# Patient Record
Sex: Female | Born: 1960
Health system: Southern US, Community
[De-identification: ages and names within clinical notes are randomized; demographics above are authoritative.]

## PROBLEM LIST (undated history)

## (undated) DIAGNOSIS — F329 Major depressive disorder, single episode, unspecified: Secondary | ICD-10-CM

## (undated) DIAGNOSIS — I10 Essential (primary) hypertension: Secondary | ICD-10-CM

## (undated) DIAGNOSIS — I499 Cardiac arrhythmia, unspecified: Secondary | ICD-10-CM

## (undated) DIAGNOSIS — F419 Anxiety disorder, unspecified: Secondary | ICD-10-CM

## (undated) DIAGNOSIS — N189 Chronic kidney disease, unspecified: Secondary | ICD-10-CM

## (undated) DIAGNOSIS — F32A Depression, unspecified: Secondary | ICD-10-CM

## (undated) DIAGNOSIS — E785 Hyperlipidemia, unspecified: Secondary | ICD-10-CM

## (undated) DIAGNOSIS — K219 Gastro-esophageal reflux disease without esophagitis: Secondary | ICD-10-CM

## (undated) DIAGNOSIS — J45902 Unspecified asthma with status asthmaticus: Secondary | ICD-10-CM

## (undated) DIAGNOSIS — J4489 Other specified chronic obstructive pulmonary disease: Secondary | ICD-10-CM

## (undated) DIAGNOSIS — R87629 Unspecified abnormal cytological findings in specimens from vagina: Secondary | ICD-10-CM

## (undated) DIAGNOSIS — L719 Rosacea, unspecified: Secondary | ICD-10-CM

## (undated) DIAGNOSIS — I493 Ventricular premature depolarization: Secondary | ICD-10-CM

## (undated) DIAGNOSIS — G43909 Migraine, unspecified, not intractable, without status migrainosus: Secondary | ICD-10-CM

## (undated) DIAGNOSIS — R232 Flushing: Secondary | ICD-10-CM

## (undated) DIAGNOSIS — G4730 Sleep apnea, unspecified: Secondary | ICD-10-CM

## (undated) DIAGNOSIS — T7840XA Allergy, unspecified, initial encounter: Secondary | ICD-10-CM

## (undated) DIAGNOSIS — D649 Anemia, unspecified: Secondary | ICD-10-CM

## (undated) DIAGNOSIS — J449 Chronic obstructive pulmonary disease, unspecified: Secondary | ICD-10-CM

## (undated) HISTORY — DX: Major depressive disorder, single episode, unspecified: F32.9

## (undated) HISTORY — DX: Migraine, unspecified, not intractable, without status migrainosus: G43.909

## (undated) HISTORY — DX: Unspecified abnormal cytological findings in specimens from vagina: R87.629

## (undated) HISTORY — PX: ABDOMINAL HYSTERECTOMY: SHX81

## (undated) HISTORY — DX: Unspecified asthma with status asthmaticus: J45.902

## (undated) HISTORY — DX: Depression, unspecified: F32.A

## (undated) HISTORY — DX: Sleep apnea, unspecified: G47.30

## (undated) HISTORY — PX: COLONOSCOPY: SHX174

## (undated) HISTORY — PX: POLYPECTOMY: SHX149

## (undated) HISTORY — DX: Anemia, unspecified: D64.9

## (undated) HISTORY — DX: Chronic kidney disease, unspecified: N18.9

## (undated) HISTORY — DX: Flushing: R23.2

## (undated) HISTORY — DX: Anxiety disorder, unspecified: F41.9

## (undated) HISTORY — DX: Essential (primary) hypertension: I10

## (undated) HISTORY — DX: Ventricular premature depolarization: I49.3

## (undated) HISTORY — DX: Other specified chronic obstructive pulmonary disease: J44.89

## (undated) HISTORY — DX: Rosacea, unspecified: L71.9

## (undated) HISTORY — DX: Allergy, unspecified, initial encounter: T78.40XA

## (undated) HISTORY — DX: Gastro-esophageal reflux disease without esophagitis: K21.9

## (undated) HISTORY — DX: Chronic obstructive pulmonary disease, unspecified: J44.9

## (undated) HISTORY — DX: Hyperlipidemia, unspecified: E78.5

---

## 1999-06-02 ENCOUNTER — Other Ambulatory Visit: Admission: RE | Admit: 1999-06-02 | Discharge: 1999-06-02 | Payer: Self-pay | Admitting: Obstetrics and Gynecology

## 1999-10-17 ENCOUNTER — Ambulatory Visit (HOSPITAL_COMMUNITY): Admission: RE | Admit: 1999-10-17 | Discharge: 1999-10-17 | Payer: Self-pay | Admitting: Allergy and Immunology

## 1999-10-17 ENCOUNTER — Encounter: Payer: Self-pay | Admitting: Allergy and Immunology

## 2000-07-25 ENCOUNTER — Encounter: Payer: Self-pay | Admitting: Internal Medicine

## 2000-07-25 ENCOUNTER — Encounter: Admission: RE | Admit: 2000-07-25 | Discharge: 2000-07-25 | Payer: Self-pay | Admitting: Internal Medicine

## 2001-06-04 ENCOUNTER — Other Ambulatory Visit: Admission: RE | Admit: 2001-06-04 | Discharge: 2001-06-04 | Payer: Self-pay | Admitting: Obstetrics and Gynecology

## 2001-06-18 ENCOUNTER — Other Ambulatory Visit: Admission: RE | Admit: 2001-06-18 | Discharge: 2001-06-18 | Payer: Self-pay | Admitting: Obstetrics and Gynecology

## 2001-08-12 ENCOUNTER — Ambulatory Visit (HOSPITAL_COMMUNITY): Admission: RE | Admit: 2001-08-12 | Discharge: 2001-08-12 | Payer: Self-pay | Admitting: Internal Medicine

## 2001-09-09 ENCOUNTER — Encounter: Admission: RE | Admit: 2001-09-09 | Discharge: 2001-12-08 | Payer: Self-pay | Admitting: Internal Medicine

## 2001-09-17 ENCOUNTER — Other Ambulatory Visit: Admission: RE | Admit: 2001-09-17 | Discharge: 2001-09-17 | Payer: Self-pay | Admitting: Obstetrics and Gynecology

## 2002-03-19 ENCOUNTER — Ambulatory Visit (HOSPITAL_COMMUNITY): Admission: RE | Admit: 2002-03-19 | Discharge: 2002-03-19 | Payer: Self-pay | Admitting: Internal Medicine

## 2002-03-19 ENCOUNTER — Encounter: Payer: Self-pay | Admitting: Cardiology

## 2002-08-06 ENCOUNTER — Other Ambulatory Visit: Admission: RE | Admit: 2002-08-06 | Discharge: 2002-08-06 | Payer: Self-pay | Admitting: Obstetrics and Gynecology

## 2002-09-26 ENCOUNTER — Encounter: Payer: Self-pay | Admitting: Internal Medicine

## 2002-09-26 ENCOUNTER — Encounter: Admission: RE | Admit: 2002-09-26 | Discharge: 2002-09-26 | Payer: Self-pay | Admitting: Internal Medicine

## 2002-09-29 ENCOUNTER — Encounter: Payer: Self-pay | Admitting: Internal Medicine

## 2002-09-29 ENCOUNTER — Encounter: Admission: RE | Admit: 2002-09-29 | Discharge: 2002-09-29 | Payer: Self-pay | Admitting: Internal Medicine

## 2003-09-18 ENCOUNTER — Other Ambulatory Visit: Admission: RE | Admit: 2003-09-18 | Discharge: 2003-09-18 | Payer: Self-pay | Admitting: Obstetrics and Gynecology

## 2003-11-25 ENCOUNTER — Encounter: Admission: RE | Admit: 2003-11-25 | Discharge: 2003-11-25 | Payer: Self-pay | Admitting: Internal Medicine

## 2004-01-01 ENCOUNTER — Encounter: Admission: RE | Admit: 2004-01-01 | Discharge: 2004-01-01 | Payer: Self-pay | Admitting: Internal Medicine

## 2004-10-18 ENCOUNTER — Other Ambulatory Visit: Admission: RE | Admit: 2004-10-18 | Discharge: 2004-10-18 | Payer: Self-pay | Admitting: Obstetrics and Gynecology

## 2004-10-31 ENCOUNTER — Observation Stay (HOSPITAL_COMMUNITY): Admission: RE | Admit: 2004-10-31 | Discharge: 2004-11-01 | Payer: Self-pay | Admitting: Obstetrics and Gynecology

## 2004-10-31 ENCOUNTER — Encounter (INDEPENDENT_AMBULATORY_CARE_PROVIDER_SITE_OTHER): Payer: Self-pay | Admitting: Specialist

## 2004-12-15 ENCOUNTER — Ambulatory Visit: Payer: Self-pay | Admitting: Internal Medicine

## 2005-01-04 ENCOUNTER — Ambulatory Visit: Payer: Self-pay | Admitting: Internal Medicine

## 2005-02-09 ENCOUNTER — Encounter: Admission: RE | Admit: 2005-02-09 | Discharge: 2005-02-09 | Payer: Self-pay | Admitting: Internal Medicine

## 2006-06-04 ENCOUNTER — Encounter: Admission: RE | Admit: 2006-06-04 | Discharge: 2006-06-04 | Payer: Self-pay | Admitting: Internal Medicine

## 2007-08-26 ENCOUNTER — Encounter: Admission: RE | Admit: 2007-08-26 | Discharge: 2007-08-26 | Payer: Self-pay | Admitting: Internal Medicine

## 2007-12-20 ENCOUNTER — Ambulatory Visit: Payer: Self-pay | Admitting: Internal Medicine

## 2008-01-31 ENCOUNTER — Ambulatory Visit: Payer: Self-pay | Admitting: Internal Medicine

## 2008-07-24 ENCOUNTER — Ambulatory Visit: Payer: Self-pay | Admitting: Internal Medicine

## 2008-09-21 ENCOUNTER — Ambulatory Visit: Payer: Self-pay | Admitting: Internal Medicine

## 2008-12-30 ENCOUNTER — Encounter: Admission: RE | Admit: 2008-12-30 | Discharge: 2008-12-30 | Payer: Self-pay | Admitting: Internal Medicine

## 2009-01-25 ENCOUNTER — Ambulatory Visit: Payer: Self-pay | Admitting: Internal Medicine

## 2009-03-03 ENCOUNTER — Emergency Department (HOSPITAL_COMMUNITY): Admission: EM | Admit: 2009-03-03 | Discharge: 2009-03-03 | Payer: Self-pay | Admitting: Emergency Medicine

## 2009-06-21 ENCOUNTER — Ambulatory Visit: Payer: Self-pay | Admitting: Internal Medicine

## 2009-12-06 ENCOUNTER — Ambulatory Visit: Payer: Self-pay | Admitting: Internal Medicine

## 2010-01-03 ENCOUNTER — Ambulatory Visit: Payer: Self-pay | Admitting: Internal Medicine

## 2010-03-07 ENCOUNTER — Encounter: Admission: RE | Admit: 2010-03-07 | Payer: Self-pay | Source: Home / Self Care | Admitting: Internal Medicine

## 2010-03-16 ENCOUNTER — Encounter: Admission: RE | Admit: 2010-03-16 | Payer: Self-pay | Source: Home / Self Care | Admitting: Internal Medicine

## 2010-03-22 ENCOUNTER — Other Ambulatory Visit: Payer: Self-pay | Admitting: Internal Medicine

## 2010-03-22 DIAGNOSIS — Z1231 Encounter for screening mammogram for malignant neoplasm of breast: Secondary | ICD-10-CM

## 2010-03-22 DIAGNOSIS — Z1239 Encounter for other screening for malignant neoplasm of breast: Secondary | ICD-10-CM

## 2010-03-23 ENCOUNTER — Encounter: Payer: Self-pay | Admitting: Internal Medicine

## 2010-03-30 ENCOUNTER — Ambulatory Visit
Admission: RE | Admit: 2010-03-30 | Discharge: 2010-03-30 | Disposition: A | Discharge status: Home / Self Care | Payer: Commercial Managed Care - PPO | Source: Ambulatory Visit | Attending: Internal Medicine | Admitting: Internal Medicine

## 2010-03-30 DIAGNOSIS — Z1231 Encounter for screening mammogram for malignant neoplasm of breast: Secondary | ICD-10-CM

## 2010-03-30 LAB — HM MAMMOGRAPHY

## 2010-04-04 ENCOUNTER — Other Ambulatory Visit: Payer: Self-pay | Admitting: Internal Medicine

## 2010-04-04 DIAGNOSIS — R928 Other abnormal and inconclusive findings on diagnostic imaging of breast: Secondary | ICD-10-CM

## 2010-04-11 ENCOUNTER — Other Ambulatory Visit: Payer: Commercial Managed Care - PPO

## 2010-05-08 LAB — POCT I-STAT, CHEM 8
Calcium, Ion: 1.24 mmol/L (ref 1.12–1.32)
Chloride: 103 mEq/L (ref 96–112)
Creatinine, Ser: 1.3 mg/dL — ABNORMAL HIGH (ref 0.4–1.2)
HCT: 38 % (ref 36.0–46.0)
Hemoglobin: 12.9 g/dL (ref 12.0–15.0)
Potassium: 4.2 mEq/L (ref 3.5–5.1)
Sodium: 139 mEq/L (ref 135–145)
TCO2: 31 mmol/L (ref 0–100)

## 2010-05-08 LAB — PROTIME-INR: Prothrombin Time: 13.1 seconds (ref 11.6–15.2)

## 2010-05-08 LAB — DIFFERENTIAL
Basophils Relative: 1 % (ref 0–1)
Eosinophils Absolute: 0.2 10*3/uL (ref 0.0–0.7)
Monocytes Absolute: 0.4 10*3/uL (ref 0.1–1.0)
Monocytes Relative: 6 % (ref 3–12)

## 2010-05-08 LAB — CBC
MCHC: 34.4 g/dL (ref 30.0–36.0)
MCV: 89 fL (ref 78.0–100.0)
RBC: 4.17 MIL/uL (ref 3.87–5.11)
WBC: 6.7 10*3/uL (ref 4.0–10.5)

## 2010-05-09 ENCOUNTER — Other Ambulatory Visit: Payer: Commercial Managed Care - PPO

## 2010-05-17 ENCOUNTER — Ambulatory Visit
Admission: RE | Admit: 2010-05-17 | Discharge: 2010-05-17 | Disposition: A | Discharge status: Home / Self Care | Payer: Commercial Managed Care - PPO | Source: Ambulatory Visit | Attending: Internal Medicine | Admitting: Internal Medicine

## 2010-05-17 DIAGNOSIS — R928 Other abnormal and inconclusive findings on diagnostic imaging of breast: Secondary | ICD-10-CM

## 2010-06-06 ENCOUNTER — Other Ambulatory Visit: Payer: Commercial Managed Care - PPO | Admitting: Internal Medicine

## 2010-06-07 ENCOUNTER — Ambulatory Visit: Payer: Commercial Managed Care - PPO | Admitting: Internal Medicine

## 2010-07-08 NOTE — Op Note (Signed)
NAME:  Samantha Hahn, Samantha Hahn                 ACCOUNT NO.:  [id number]   MEDICAL RECORD NO.:  [id number]          PATIENT TYPE:  INP   LOCATION:  9399                          FACILITY:  [facility]   PHYSICIAN:  Cynthia P. Romine, M.D.DATE OF BIRTH:  10/24/1960   DATE OF PROCEDURE:  10/31/2004  DATE OF DISCHARGE:                                 OPERATIVE REPORT   PREOPERATIVE DIAGNOSES:  1.  Menorrhagia.  2.  Probable adenomyosis.   POSTOPERATIVE DIAGNOSES:  1.  Menorrhagia.  2.  Probable adenomyosis.   OPERATION/PROCEDURE:  Total vaginal hysterectomy.   SURGEON:  Cynthia P. Romine, M.D.   ASSISTANT:  Joan Flores, M.D.   ANESTHESIA:  General endotracheal anesthesia.   ESTIMATED BLOOD LOSS:  150 mL.   COMPLICATIONS:  None.   DESCRIPTION OF PROCEDURE:  The patient is taken to the operating room and  was placed in the dorsal lithotomy position and prepped and draped in the  usual fashion after the induction of general endotracheal anesthesia.  The  operator put in a posterior weight and anterior Sims retractor and a Jacobs  tenaculum on the cervix to assess.  I thought a vaginal approach was  possible.  However, we were prepared for an abdominal approach if it was  felt unlikely to be successful to proceed vaginally.  The cervix did descend  more with the patient under anesthesia when traction was applied to the  cervix than it had in the office and, therefore, it was felt reasonable to  attempt the vaginal approach.  The patient was draped in the room and was  set up for a vaginal hysterectomy.  The cervix was grasped at the anterior  lip with the [facility] tenaculum.  The mucosa over the cervix was injected with  1% Xylocaine with epinephrine.  Incision was made in the mucosa over the  cervix from 9 to 3 o'clock.  The bladder was dissected anteriorly.  Attention was then turned posteriorly and a posterior colpotomy incision was  made and the Bonanno retractor was placed.  Uterosacral  ligaments were  clamped, cut and tied with 0 Vicryl.  The patient still had excellent  supportive of her uterus and visualization was slightly difficult.  Cardinal  ligaments were clamped, cut and tied with 0 chromic.  The cardinal ligaments  were quite long and required two or three bites on each side to get up to  uterine arteries which were clamped, cut and tied on each side.  The fundus  was then looked out through the cul-de-sac and the pedicle containing the  utero-ovarian ________ around was clamped, cut and triply tied on each side.  There was a small amount of bleeding just superior to the uterosacral  ligament pedicle on the patient's right that was clipped with the Heaney and  then tied with free tie.  This did achieve hemostasis.  The adnexal pedicles  were respected and felt to be hemostatic.  They were cut free.  The cuff was  then run with a running locking stitch of 0 Vicryl from 2 o'clock to 10  o'clock.  The wound was irrigated, inspected and felt to be hemostatic.  The  vaginal cuff was closed with interrupted figure-of-eights with 0 Vicryl.  Uterosacral ligaments were tied together in the midline.  The incision was  irrigated again.  Instruments were removed from the vagina and the procedure  was terminated.  The patient tolerated it well and went in satisfactory  condition to post anesthesia recovery.  She was put on sliding scale insulin  postoperatively and will resume her normal oral diabetic medications in the  morning.  This will be done when she goes home.  She will be discharged in  the morning and will take the medicines at home when she gets home.           ______________________________  Lubertha South. Romine, M.D.     CPR/MEDQ  D:  10/31/2004  T:  11/01/2004  Job:  [id number]

## 2010-07-08 NOTE — Discharge Summary (Signed)
NAME:  Samantha Hahn, Samantha Hahn                 ACCOUNT NO.:  [id number]   MEDICAL RECORD NO.:  [id number]          PATIENT TYPE:  OBV   LOCATION:  9315                          FACILITY:  [facility]   PHYSICIAN:  Cynthia P. Romine, M.D.DATE OF BIRTH:  [birth year]   DATE OF ADMISSION:  10/31/2004  DATE OF DISCHARGE:  11/01/2004                                 DISCHARGE SUMMARY   DISCHARGE DIAGNOSES:  Menorrhagia.   PROCEDURES:  Total vaginal hysterectomy.   HISTORY:  This is a 50 year old married white female gravida 1, para 1 with  menorrhagia and hemoglobin as low as 10.3.  On pelvic ultrasound her uterus  showed signs of adenomyosis and the patient elected definitive therapy with  hysterectomy.  On October 31, 2004 she underwent a total vaginal  hysterectomy without difficulty.  Estimated blood loss was 150 mL.  There  were no complications.  Postoperatively she did very well.  She was felt to  be in satisfactory condition for discharge on November 01, 2004.  She was  given a prescription for Percocet 5 mg #15 to take one p.o. q.4h. p.r.n.  pain and was instructed to follow up in the office in four weeks.   PATHOLOGY:  Showed adenomyosis and intramural leiomyomas.   LABORATORIES:  Showed admission H&H was 11 and 33, on discharge 9.9 and  29.5.           ______________________________  Lubertha South. Romine, M.D.     CPR/MEDQ  D:  12/21/2004  T:  12/21/2004  Job:  [id number]

## 2010-07-13 ENCOUNTER — Other Ambulatory Visit: Payer: Self-pay | Admitting: Internal Medicine

## 2010-07-14 ENCOUNTER — Other Ambulatory Visit: Payer: Self-pay | Admitting: *Deleted

## 2010-07-14 DIAGNOSIS — N959 Unspecified menopausal and perimenopausal disorder: Secondary | ICD-10-CM

## 2010-07-14 MED ORDER — ESTROGENS CONJUGATED 0.3 MG PO TABS: 0.3000 mg | ORAL_TABLET | Freq: Every day | ORAL | Status: DC

## 2010-08-19 ENCOUNTER — Encounter: Payer: Self-pay | Admitting: Internal Medicine

## 2010-08-23 ENCOUNTER — Other Ambulatory Visit: Payer: Commercial Managed Care - PPO | Admitting: Internal Medicine

## 2010-08-23 DIAGNOSIS — E119 Type 2 diabetes mellitus without complications: Secondary | ICD-10-CM

## 2010-08-23 LAB — HEMOGLOBIN A1C: Mean Plasma Glucose: 197 mg/dL — ABNORMAL HIGH (ref <117)

## 2010-08-25 ENCOUNTER — Ambulatory Visit (INDEPENDENT_AMBULATORY_CARE_PROVIDER_SITE_OTHER): Payer: Commercial Managed Care - PPO | Admitting: Internal Medicine

## 2010-08-25 ENCOUNTER — Encounter: Payer: Self-pay | Admitting: Internal Medicine

## 2010-08-25 DIAGNOSIS — G43909 Migraine, unspecified, not intractable, without status migrainosus: Secondary | ICD-10-CM

## 2010-08-25 DIAGNOSIS — E119 Type 2 diabetes mellitus without complications: Secondary | ICD-10-CM

## 2010-08-25 DIAGNOSIS — I10 Essential (primary) hypertension: Secondary | ICD-10-CM

## 2010-08-25 DIAGNOSIS — F32A Depression, unspecified: Secondary | ICD-10-CM

## 2010-08-25 DIAGNOSIS — F329 Major depressive disorder, single episode, unspecified: Secondary | ICD-10-CM

## 2010-09-18 ENCOUNTER — Encounter: Payer: Self-pay | Admitting: Internal Medicine

## 2010-09-18 DIAGNOSIS — I10 Essential (primary) hypertension: Secondary | ICD-10-CM | POA: Insufficient documentation

## 2010-09-18 DIAGNOSIS — F329 Major depressive disorder, single episode, unspecified: Secondary | ICD-10-CM | POA: Insufficient documentation

## 2010-09-18 DIAGNOSIS — G43909 Migraine, unspecified, not intractable, without status migrainosus: Secondary | ICD-10-CM | POA: Insufficient documentation

## 2010-09-18 DIAGNOSIS — F32A Depression, unspecified: Secondary | ICD-10-CM | POA: Insufficient documentation

## 2010-09-18 DIAGNOSIS — E119 Type 2 diabetes mellitus without complications: Secondary | ICD-10-CM | POA: Insufficient documentation

## 2010-09-18 NOTE — Progress Notes (Signed)
  Subjective:    Patient ID: Samantha Hahn, female    DOB: [birth year], 51 y.o.   MRN: [id number]  HPI patient with history of diabetes treated with metformin and Actos as well as Januvia in today for followup. Hemoglobin A1c drawn. 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    Review of Systems     Objective:   Physical Exam chest clear; cardiac exam regular rate and rhythm normal S1 and S2; extremities without edema.        Assessment & Plan:   Diabetes mellitus  Hypertension  Depression  Migraine headaches  Patient will continue to work with [facility] staff at [facility]. Needs to have physical exam in the next 6 months. Reminded about diabetic eye exam.

## 2010-10-10 ENCOUNTER — Other Ambulatory Visit: Payer: Self-pay | Admitting: Internal Medicine

## 2010-10-12 ENCOUNTER — Other Ambulatory Visit: Payer: Self-pay | Admitting: Internal Medicine

## 2010-10-27 ENCOUNTER — Other Ambulatory Visit: Payer: Commercial Managed Care - PPO | Admitting: Internal Medicine

## 2010-10-28 ENCOUNTER — Other Ambulatory Visit: Payer: Self-pay | Admitting: Internal Medicine

## 2010-10-31 ENCOUNTER — Other Ambulatory Visit: Payer: Commercial Managed Care - PPO | Admitting: Internal Medicine

## 2010-10-31 DIAGNOSIS — E785 Hyperlipidemia, unspecified: Secondary | ICD-10-CM

## 2010-10-31 LAB — HEPATIC FUNCTION PANEL
ALT: 15 U/L (ref 0–35)
AST: 16 U/L (ref 0–37)
Albumin: 3.7 g/dL (ref 3.5–5.2)
Total Protein: 6.5 g/dL (ref 6.0–8.3)

## 2010-10-31 LAB — LIPID PANEL
HDL: 46 mg/dL (ref >39)
Total CHOL/HDL Ratio: 2.8 Ratio
Triglycerides: 79 mg/dL (ref <150)
VLDL: 16 mg/dL (ref 0–40)

## 2010-12-22 ENCOUNTER — Other Ambulatory Visit: Payer: Self-pay | Admitting: Internal Medicine

## 2011-01-10 ENCOUNTER — Other Ambulatory Visit: Payer: Self-pay | Admitting: Internal Medicine

## 2011-02-27 ENCOUNTER — Ambulatory Visit: Payer: PRIVATE HEALTH INSURANCE | Attending: Family Medicine | Admitting: Physical Therapy

## 2011-02-27 DIAGNOSIS — M25519 Pain in unspecified shoulder: Secondary | ICD-10-CM | POA: Insufficient documentation

## 2011-02-27 DIAGNOSIS — IMO0001 Reserved for inherently not codable concepts without codable children: Secondary | ICD-10-CM | POA: Insufficient documentation

## 2011-02-27 DIAGNOSIS — M25619 Stiffness of unspecified shoulder, not elsewhere classified: Secondary | ICD-10-CM | POA: Insufficient documentation

## 2011-03-01 ENCOUNTER — Encounter: Payer: Commercial Managed Care - PPO | Admitting: Physical Therapy

## 2011-03-02 ENCOUNTER — Other Ambulatory Visit: Payer: Self-pay | Admitting: Internal Medicine

## 2011-03-02 ENCOUNTER — Ambulatory Visit: Payer: PRIVATE HEALTH INSURANCE | Admitting: Rehabilitation

## 2011-03-07 ENCOUNTER — Other Ambulatory Visit: Payer: Commercial Managed Care - PPO | Admitting: Internal Medicine

## 2011-03-07 ENCOUNTER — Ambulatory Visit: Payer: PRIVATE HEALTH INSURANCE | Admitting: Physical Therapy

## 2011-03-07 DIAGNOSIS — Z0000 Encounter for general adult medical examination without abnormal findings: Secondary | ICD-10-CM

## 2011-03-07 DIAGNOSIS — I10 Essential (primary) hypertension: Secondary | ICD-10-CM

## 2011-03-08 ENCOUNTER — Ambulatory Visit: Payer: PRIVATE HEALTH INSURANCE | Admitting: Physical Therapy

## 2011-03-08 LAB — COMPREHENSIVE METABOLIC PANEL
ALT: 11 U/L (ref 0–35)
AST: 13 U/L (ref 0–37)
Calcium: 9.5 mg/dL (ref 8.4–10.5)
Chloride: 103 mEq/L (ref 96–112)
Creat: 1.19 mg/dL — ABNORMAL HIGH (ref 0.50–1.10)
Sodium: 139 mEq/L (ref 135–145)
Total Protein: 6.6 g/dL (ref 6.0–8.3)

## 2011-03-08 LAB — CBC WITH DIFFERENTIAL/PLATELET
Basophils Absolute: 0 10*3/uL (ref 0.0–0.1)
Eosinophils Relative: 7 % — ABNORMAL HIGH (ref 0–5)
Lymphocytes Relative: 19 % (ref 12–46)
Lymphs Abs: 1.3 10*3/uL (ref 0.7–4.0)
MCV: 94.3 fL (ref 78.0–100.0)
Neutro Abs: 4.9 10*3/uL (ref 1.7–7.7)
Neutrophils Relative %: 70 % (ref 43–77)
Platelets: 226 10*3/uL (ref 150–400)
RBC: 3.83 MIL/uL — ABNORMAL LOW (ref 3.87–5.11)
RDW: 14 % (ref 11.5–15.5)
WBC: 7 10*3/uL (ref 4.0–10.5)

## 2011-03-08 LAB — VITAMIN D 25 HYDROXY (VIT D DEFICIENCY, FRACTURES): Vit D, 25-Hydroxy: 46 ng/mL (ref 30–89)

## 2011-03-08 LAB — LIPID PANEL
Cholesterol: 136 mg/dL (ref 0–200)
Triglycerides: 170 mg/dL — ABNORMAL HIGH (ref <150)

## 2011-03-08 LAB — HEMOGLOBIN A1C
Hgb A1c MFr Bld: 6.2 % — ABNORMAL HIGH (ref <5.7)
Mean Plasma Glucose: 131 mg/dL — ABNORMAL HIGH (ref <117)

## 2011-03-09 ENCOUNTER — Encounter: Payer: Self-pay | Admitting: Internal Medicine

## 2011-03-09 ENCOUNTER — Other Ambulatory Visit (HOSPITAL_COMMUNITY)
Admission: RE | Admit: 2011-03-09 | Discharge: 2011-03-09 | Disposition: A | Discharge status: Home / Self Care | Payer: 59 | Source: Ambulatory Visit | Attending: Internal Medicine | Admitting: Internal Medicine

## 2011-03-09 ENCOUNTER — Ambulatory Visit (INDEPENDENT_AMBULATORY_CARE_PROVIDER_SITE_OTHER): Payer: 59 | Admitting: Internal Medicine

## 2011-03-09 VITALS — BP 122/72 | HR 76 | Temp 98.2°F | Ht 67.75 in | Wt 233.0 lb

## 2011-03-09 DIAGNOSIS — Z01419 Encounter for gynecological examination (general) (routine) without abnormal findings: Secondary | ICD-10-CM | POA: Insufficient documentation

## 2011-03-09 DIAGNOSIS — Z1272 Encounter for screening for malignant neoplasm of vagina: Secondary | ICD-10-CM

## 2011-03-09 DIAGNOSIS — I10 Essential (primary) hypertension: Secondary | ICD-10-CM

## 2011-03-09 DIAGNOSIS — K219 Gastro-esophageal reflux disease without esophagitis: Secondary | ICD-10-CM

## 2011-03-09 DIAGNOSIS — Z8659 Personal history of other mental and behavioral disorders: Secondary | ICD-10-CM

## 2011-03-09 DIAGNOSIS — Z1211 Encounter for screening for malignant neoplasm of colon: Secondary | ICD-10-CM

## 2011-03-09 DIAGNOSIS — E1169 Type 2 diabetes mellitus with other specified complication: Secondary | ICD-10-CM

## 2011-03-09 DIAGNOSIS — R0602 Shortness of breath: Secondary | ICD-10-CM

## 2011-03-09 DIAGNOSIS — Z0000 Encounter for general adult medical examination without abnormal findings: Secondary | ICD-10-CM

## 2011-03-09 LAB — POCT URINALYSIS DIPSTICK
Bilirubin, UA: NEGATIVE
Blood, UA: NEGATIVE
Glucose, UA: NEGATIVE
Ketones, UA: NEGATIVE
Leukocytes, UA: NEGATIVE
Nitrite, UA: NEGATIVE
Protein, UA: NEGATIVE
Spec Grav, UA: 1.01
Urobilinogen, UA: NEGATIVE
pH, UA: 5

## 2011-03-10 ENCOUNTER — Ambulatory Visit: Payer: Self-pay

## 2011-03-10 ENCOUNTER — Other Ambulatory Visit: Payer: Self-pay | Admitting: Occupational Medicine

## 2011-03-10 DIAGNOSIS — R52 Pain, unspecified: Secondary | ICD-10-CM

## 2011-03-13 ENCOUNTER — Ambulatory Visit: Payer: PRIVATE HEALTH INSURANCE | Admitting: Physical Therapy

## 2011-03-14 ENCOUNTER — Institutional Professional Consult (permissible substitution): Payer: Commercial Managed Care - PPO | Admitting: Cardiology

## 2011-03-14 ENCOUNTER — Encounter: Payer: Self-pay | Admitting: Cardiology

## 2011-03-14 ENCOUNTER — Telehealth: Payer: Self-pay

## 2011-03-14 ENCOUNTER — Ambulatory Visit (INDEPENDENT_AMBULATORY_CARE_PROVIDER_SITE_OTHER): Payer: Commercial Managed Care - PPO | Admitting: Cardiology

## 2011-03-14 DIAGNOSIS — I10 Essential (primary) hypertension: Secondary | ICD-10-CM

## 2011-03-14 DIAGNOSIS — R079 Chest pain, unspecified: Secondary | ICD-10-CM

## 2011-03-14 DIAGNOSIS — R0600 Dyspnea, unspecified: Secondary | ICD-10-CM | POA: Insufficient documentation

## 2011-03-14 DIAGNOSIS — M549 Dorsalgia, unspecified: Secondary | ICD-10-CM | POA: Insufficient documentation

## 2011-03-14 DIAGNOSIS — R0602 Shortness of breath: Secondary | ICD-10-CM

## 2011-03-14 LAB — BRAIN NATRIURETIC PEPTIDE: Pro B Natriuretic peptide (BNP): 165 pg/mL — ABNORMAL HIGH (ref 0.0–100.0)

## 2011-03-14 NOTE — Assessment & Plan Note (Signed)
The blood pressure is at target. No change in medications is indicated. We will continue with therapeutic lifestyle changes (TLC).  

## 2011-03-14 NOTE — Progress Notes (Signed)
HPI The patient presents for evaluation of shortness of breath. This has been progressive over a few weeks. She's been getting more short of breath with activities such as walking 50 yards on level ground. She's here he short of breath climbing up a flight of stairs. She's had a couple of episodes of feeling short of breath during activity such as taking a shower but she's just standing there. She's not describing PND or orthopnea. She has had some mild lower externally swelling and a 7 pound weight gain despite no change in salt or diet or fluid intakes. She's also been having some pain between her shoulders with activity such as walking through the [facility] parking lot. She's not been having any of these symptoms at rest. She does not describe radiation to her jaw or to her arms. She's not describing associated nausea vomiting or diaphoresis. She denies any palpitations, presyncope or syncope.  Allergies  Allergen Reactions  . Biaxin Hives  . Vicodin (Hydrocodone-Acetaminophen) Nausea And Vomiting    Current Outpatient Prescriptions  Medication Sig Dispense Refill  . ACTOS 45 MG tablet TAKE 1 TABLET BY MOUTH DAILY  90 tablet  PRN  . albuterol (PROVENTIL) (5 MG/ML) 0.5% nebulizer solution Take 2.5 mg by nebulization every 6 (six) hours as needed.        Marland Kitchen atorvastatin (LIPITOR) 20 MG tablet TAKE 1 TABLET BY MOUTH DAILY  90 tablet  0  . budesonide (RHINOCORT AQUA) 32 MCG/ACT nasal spray Place 1 spray into the nose daily.        Marland Kitchen glipiZIDE (GLUCOTROL) 10 MG tablet Take 10 mg by mouth 2 (two) times daily before a meal.        . loratadine (CLARITIN) 10 MG tablet Take 10 mg by mouth daily.        . metFORMIN (GLUCOPHAGE) 1000 MG tablet Take 1,000 mg by mouth 2 (two) times daily with a meal.        . metoprolol (TOPROL-XL) 50 MG 24 hr tablet TAKE 1 TABLET BY MOUTH EVERY DAY  90 tablet  PRN  . multivitamin (THERAGRAN) per tablet Take 1 tablet by mouth daily.        Marland Kitchen PREMARIN 0.3 MG tablet TAKE 1  TABLET BY MOUTH ONCE DAILY FOR 21 DAYS THEN DO NOT TAKE FOR 7 DAYS  30 tablet  1  . sitaGLIPtin (JANUVIA) 100 MG tablet Take 100 mg by mouth daily.        . SUMAtriptan (IMITREX) 100 MG tablet Take 100 mg by mouth every 2 (two) hours as needed.        . SYMBICORT 160-4.5 MCG/ACT inhaler INHALE 2 PUFFS BY MOUTH TWICE DAILY  3 Inhaler  PRN  . VENTOLIN HFA 108 (90 BASE) MCG/ACT inhaler INHALE 2 SPRAYS BY MOUTH FOUR TIMES DAILY  54 each  PRN  . METOPROLOL SUCCINATE PO Take 50 mg by mouth 1 dose over 24 hours.          Past Medical History  Diagnosis Date  . Hypertension   . Allergy   . Acne rosacea   . GERD (gastroesophageal reflux disease)   . Diabetes mellitus   . Migraines   . Hot flashes   . Anemia   . Abnormal vaginal Pap smear   . Anxiety   . Depression     Past Surgical History  Procedure Date  . Abdominal hysterectomy     Family History  Problem Relation Age of Onset  . Heart disease Mother   .  Asthma Mother   . Asthma Sister     History   Social History  . Marital Status: Married    Spouse Name: N/A    Number of Children: N/A  . Years of Education: N/A   Occupational History  . Not on file.   Social History Main Topics  . Smoking status: Never Smoker   . Smokeless tobacco: Not on file  . Alcohol Use: Not on file  . Drug Use: Not on file  . Sexually Active: Not on file   Other Topics Concern  . Not on file   Social History Narrative  . No narrative on file    ROS:  As stated in the HPI and negative for all other systems.  PHYSICAL EXAM BP 121/90  Pulse 62  Ht 5\' 8"  (1.727 m)  Wt 235 lb (106.595 kg)  BMI 35.73 kg/m2 GENERAL:  Well appearing HEENT:  Pupils equal round and reactive, fundi not visualized, oral mucosa unremarkable NECK:  No jugular venous distention, waveform within normal limits, carotid upstroke brisk and symmetric, no bruits, no thyromegaly LYMPHATICS:  No cervical, inguinal adenopathy LUNGS:  Clear to auscultation  bilaterally BACK:  No CVA tenderness CHEST:  Unremarkable HEART:  PMI not displaced or sustained,S1 and S2 within normal limits, no S3, no S4, no clicks, no rubs, no murmurs ABD:  Flat, positive bowel sounds normal in frequency in pitch, no bruits, no rebound, no guarding, no midline pulsatile mass, no hepatomegaly, no splenomegaly EXT:  2 plus pulses throughout, no edema, no cyanosis no clubbing SKIN:  No rashes no nodules NEURO:  Cranial nerves II through XII grossly intact, motor grossly intact throughout PSYCH:  Cognitively intact, oriented to person place and time  EKG:  03/09/11  NSR, axis within normal limits, intervals within normal limits, no acute ST-T wave changes  ASSESSMENT AND PLAN

## 2011-03-14 NOTE — Patient Instructions (Addendum)
Please have blood work today. (BNP)  Your physician has requested that you have an echocardiogram. Echocardiography is a painless test that uses sound waves to create images of your heart. It provides your doctor with information about the size and shape of your heart and how well your heart's chambers and valves are working. This procedure takes approximately one hour. There are no restrictions for this procedure.  Your physician has requested that you have cardiac CT. Cardiac computed tomography (CT) is a painless test that uses an x-ray machine to take clear, detailed pictures of your heart. For further information please visit [email or web address]. Please follow instruction sheet as given.

## 2011-03-14 NOTE — Assessment & Plan Note (Addendum)
This pain is concerning for an anginal equivalent. She has significant cardiovascular risk factors including a strong family history. Given this screening with CT coronary angiography is indicated.  I do not think that ETT has the necessary sensitivity in this situation.  If she has a negative ETT and continued symptoms she would need further testing.

## 2011-03-14 NOTE — Telephone Encounter (Signed)
Patient scheduled for an appointment with Dr. Hilarie Fredrickson at [place] on 04/11/2011 for her colonoscopy, and with Dr. Percival Spanish, cardiologist on 04/14/2011 at 11:30. Informed of these appointments.

## 2011-03-14 NOTE — Assessment & Plan Note (Signed)
I will assess this with a BNP level and echocardiogram.

## 2011-03-15 ENCOUNTER — Ambulatory Visit: Payer: PRIVATE HEALTH INSURANCE | Admitting: Physical Therapy

## 2011-03-16 ENCOUNTER — Encounter: Payer: Self-pay | Admitting: *Deleted

## 2011-03-21 ENCOUNTER — Encounter: Payer: Commercial Managed Care - PPO | Admitting: Physical Therapy

## 2011-03-21 ENCOUNTER — Ambulatory Visit (HOSPITAL_COMMUNITY): Payer: 59 | Attending: Cardiology

## 2011-03-21 DIAGNOSIS — K219 Gastro-esophageal reflux disease without esophagitis: Secondary | ICD-10-CM | POA: Insufficient documentation

## 2011-03-21 DIAGNOSIS — R609 Edema, unspecified: Secondary | ICD-10-CM | POA: Insufficient documentation

## 2011-03-21 DIAGNOSIS — E119 Type 2 diabetes mellitus without complications: Secondary | ICD-10-CM | POA: Insufficient documentation

## 2011-03-21 DIAGNOSIS — R0989 Other specified symptoms and signs involving the circulatory and respiratory systems: Secondary | ICD-10-CM | POA: Insufficient documentation

## 2011-03-21 DIAGNOSIS — R0602 Shortness of breath: Secondary | ICD-10-CM

## 2011-03-21 DIAGNOSIS — R0609 Other forms of dyspnea: Secondary | ICD-10-CM | POA: Insufficient documentation

## 2011-03-21 DIAGNOSIS — I10 Essential (primary) hypertension: Secondary | ICD-10-CM

## 2011-03-23 ENCOUNTER — Ambulatory Visit: Payer: PRIVATE HEALTH INSURANCE | Admitting: Physical Therapy

## 2011-03-24 ENCOUNTER — Ambulatory Visit (HOSPITAL_COMMUNITY)
Admission: RE | Admit: 2011-03-24 | Discharge: 2011-03-24 | Disposition: A | Discharge status: Home / Self Care | Payer: 59 | Source: Ambulatory Visit | Attending: Cardiology | Admitting: Cardiology

## 2011-03-24 ENCOUNTER — Encounter (HOSPITAL_COMMUNITY): Payer: Self-pay | Admitting: *Deleted

## 2011-03-24 DIAGNOSIS — R079 Chest pain, unspecified: Secondary | ICD-10-CM | POA: Insufficient documentation

## 2011-03-24 DIAGNOSIS — R0602 Shortness of breath: Secondary | ICD-10-CM | POA: Insufficient documentation

## 2011-03-24 DIAGNOSIS — I10 Essential (primary) hypertension: Secondary | ICD-10-CM | POA: Insufficient documentation

## 2011-03-24 MED ORDER — NITROGLYCERIN 0.4 MG SL SUBL
SUBLINGUAL_TABLET | SUBLINGUAL | Status: AC
  Filled 2011-03-24: qty 25

## 2011-03-24 MED ORDER — METOPROLOL TARTRATE 1 MG/ML IV SOLN
INTRAVENOUS | Status: AC
  Filled 2011-03-24: qty 15

## 2011-03-24 MED ORDER — NITROGLYCERIN 0.4 MG SL SUBL: 0.4000 mg | SUBLINGUAL_TABLET | SUBLINGUAL | Status: DC | PRN

## 2011-03-24 MED ORDER — METOPROLOL TARTRATE 1 MG/ML IV SOLN
5.0000 mg | INTRAVENOUS | Status: DC | PRN
  Administered 2011-03-24 (×2): 5 mg via INTRAVENOUS

## 2011-03-28 ENCOUNTER — Ambulatory Visit: Payer: PRIVATE HEALTH INSURANCE | Attending: Family Medicine | Admitting: Physical Therapy

## 2011-03-28 DIAGNOSIS — IMO0001 Reserved for inherently not codable concepts without codable children: Secondary | ICD-10-CM | POA: Insufficient documentation

## 2011-03-28 DIAGNOSIS — M25519 Pain in unspecified shoulder: Secondary | ICD-10-CM | POA: Insufficient documentation

## 2011-03-28 DIAGNOSIS — M25619 Stiffness of unspecified shoulder, not elsewhere classified: Secondary | ICD-10-CM | POA: Insufficient documentation

## 2011-03-29 ENCOUNTER — Telehealth: Payer: Self-pay | Admitting: Cardiology

## 2011-03-29 DIAGNOSIS — Z8249 Family history of ischemic heart disease and other diseases of the circulatory system: Secondary | ICD-10-CM

## 2011-03-29 DIAGNOSIS — R0602 Shortness of breath: Secondary | ICD-10-CM

## 2011-03-29 NOTE — Telephone Encounter (Signed)
Order placed for stress testing - pt will not be able to walk this study due to her SOB.  Will have the scheduler call her for an appointment.

## 2011-03-29 NOTE — Telephone Encounter (Signed)
Reviewed instructions with pt who states understanding.  She is aware that our office will call for to schedule the appointment

## 2011-03-29 NOTE — Telephone Encounter (Signed)
Pt was to have a procedure but due to PVC's wasn't able to have it done, dr hochrein said he would schedule a different test and his nurse would call first of the week, calling back because she hasn't heard from [place]

## 2011-03-30 ENCOUNTER — Ambulatory Visit: Payer: PRIVATE HEALTH INSURANCE | Admitting: Physical Therapy

## 2011-04-06 ENCOUNTER — Other Ambulatory Visit: Payer: Self-pay | Admitting: Internal Medicine

## 2011-04-06 ENCOUNTER — Encounter: Payer: Commercial Managed Care - PPO | Admitting: Internal Medicine

## 2011-04-06 ENCOUNTER — Ambulatory Visit: Payer: PRIVATE HEALTH INSURANCE | Admitting: Physical Therapy

## 2011-04-11 ENCOUNTER — Encounter: Payer: Self-pay | Admitting: Rehabilitation

## 2011-04-11 ENCOUNTER — Other Ambulatory Visit: Payer: Commercial Managed Care - PPO | Admitting: Internal Medicine

## 2011-04-13 ENCOUNTER — Ambulatory Visit (HOSPITAL_COMMUNITY): Payer: 59 | Attending: Cardiology | Admitting: Radiology

## 2011-04-13 DIAGNOSIS — I10 Essential (primary) hypertension: Secondary | ICD-10-CM | POA: Insufficient documentation

## 2011-04-13 DIAGNOSIS — E119 Type 2 diabetes mellitus without complications: Secondary | ICD-10-CM | POA: Insufficient documentation

## 2011-04-13 DIAGNOSIS — R079 Chest pain, unspecified: Secondary | ICD-10-CM

## 2011-04-13 DIAGNOSIS — R0609 Other forms of dyspnea: Secondary | ICD-10-CM | POA: Insufficient documentation

## 2011-04-13 DIAGNOSIS — R0989 Other specified symptoms and signs involving the circulatory and respiratory systems: Secondary | ICD-10-CM | POA: Insufficient documentation

## 2011-04-13 DIAGNOSIS — Z8249 Family history of ischemic heart disease and other diseases of the circulatory system: Secondary | ICD-10-CM | POA: Insufficient documentation

## 2011-04-13 DIAGNOSIS — R0602 Shortness of breath: Secondary | ICD-10-CM | POA: Insufficient documentation

## 2011-04-13 MED ORDER — REGADENOSON 0.4 MG/5ML IV SOLN
0.4000 mg | Freq: Once | INTRAVENOUS | Status: AC
  Administered 2011-04-13: 0.4 mg via INTRAVENOUS

## 2011-04-13 MED ORDER — TECHNETIUM TC 99M TETROFOSMIN IV KIT
33.0000 | PACK | Freq: Once | INTRAVENOUS | Status: AC | PRN
  Administered 2011-04-13: 33 via INTRAVENOUS

## 2011-04-13 MED ORDER — TECHNETIUM TC 99M TETROFOSMIN IV KIT
10.2000 | PACK | Freq: Once | INTRAVENOUS | Status: AC | PRN
  Administered 2011-04-13: 10 via INTRAVENOUS

## 2011-04-13 NOTE — Progress Notes (Signed)
[facility] [place] [address] [phone]  Cardiology Nuclear Med Study  Samantha Hahn is a 51 y.o. female [id number] June 17, 1960   Nuclear Med Background Indication for Stress Test:  Evaluation for Ischemia History: 1/13 Echo-EF-55-60% mild LVH and GXT Cardiac Risk Factors: Family History - CAD, Hypertension and NIDDM  Symptoms:  DOE and SOB   Nuclear Pre-Procedure Caffeine/Decaff Intake:  None NPO After: 8:00pm   Lungs:  Clear IV 0.9% NS with Angio Cath:  22g  IV Site: R Hand  IV Started by:  Crissie Figures, RN  Chest Size (in):  40 Cup Size: C  Height: 5\' 8"  (1.727 m)  Weight:  233 lb (105.688 kg)  BMI:  Body mass index is 35.43 kg/(m^2). Tech Comments:  Patient held Metoprolol and Diabetic meds this AM    Nuclear Med Study 1 or 2 day study: 1 day  Stress Test Type:  Lexiscan  Reading MD: Dola Argyle, MD  Order Authorizing Provider:  Minus Breeding, MD  Resting Radionuclide: Technetium 51m Tetrofosmin  Resting Radionuclide Dose: 10.2 mCi   Stress Radionuclide:  Technetium 49m Tetrofosmin  Stress Radionuclide Dose: 33.0 mCi           Stress Protocol Rest HR: 64 Stress HR: 96  Rest BP: 132/80 Stress BP: 136/76  Exercise Time (min): n/a METS: n/a   Predicted Max HR: 168 bpm % Max HR: 66.67 bpm Rate Pressure Product: 15904   Dose of Adenosine (mg):  n/a Dose of Lexiscan: 0.4 mg  Dose of Atropine (mg): n/a Dose of Dobutamine: n/a mcg/kg/min (at max HR)  Stress Test Technologist: Letta Moynahan, CMA-N  Nuclear Technologist:  Annye Rusk, CNMT     Rest Procedure:  Myocardial perfusion imaging was performed at rest 45 minutes following the intravenous administration of Technetium 26m Tetrofosmin. Rest ECG: NSR with occ. PVC's  Stress Procedure:  The patient received IV Lexiscan 0.4 mg over 15-seconds.  Technetium 4m Tetrofosmin injected at 30-seconds.  There were no significant changes with Lexiscan. Occ. PVC's  and PAC's noted.  Quantitative spect images were obtained after a 45 minute delay. Stress ECG: No significant change from baseline ECG  QPS Raw Data Images:  Normal; no motion artifact; normal heart/lung ratio. The patient was scanned with both arms down. Stress Images:  Normal homogeneous uptake in all areas of the myocardium. Rest Images:  Normal homogeneous uptake in all areas of the myocardium. Subtraction (SDS):  No evidence of ischemia. Transient Ischemic Dilatation (Normal <1.22):  1.21 Lung/Heart Ratio (Normal <0.45):  0.35  Quantitative Gated Spect Images QGS EDV:  107 ml QGS ESV:  37 ml QGS cine images:  Normal Wall Motion QGS EF: 66%  Impression Exercise Capacity:  Lexiscan with no exercise. BP Response:  Normal blood pressure response. Clinical Symptoms:  SOB ECG Impression:  No significant ST segment change suggestive of ischemia. Comparison with Prior Nuclear Study: No images to compare  Overall Impression:  Normal stress nuclear study.  Dola Argyle, MD

## 2011-04-14 ENCOUNTER — Ambulatory Visit: Payer: PRIVATE HEALTH INSURANCE | Admitting: Physical Therapy

## 2011-04-17 ENCOUNTER — Ambulatory Visit: Payer: PRIVATE HEALTH INSURANCE | Admitting: Physical Therapy

## 2011-04-18 ENCOUNTER — Telehealth: Payer: Self-pay | Admitting: Cardiology

## 2011-04-18 NOTE — Telephone Encounter (Signed)
New Msg: pt calling wanting to know results of pt tests done recently. Please return pt call to discuss further.

## 2011-04-18 NOTE — Telephone Encounter (Signed)
Results reviewed with pt and she was scheduled for a follow up appt.

## 2011-04-20 ENCOUNTER — Ambulatory Visit: Payer: PRIVATE HEALTH INSURANCE | Admitting: Physical Therapy

## 2011-04-24 ENCOUNTER — Ambulatory Visit: Payer: PRIVATE HEALTH INSURANCE | Admitting: Physical Therapy

## 2011-04-27 ENCOUNTER — Encounter: Payer: Self-pay | Admitting: Cardiology

## 2011-04-27 ENCOUNTER — Ambulatory Visit (INDEPENDENT_AMBULATORY_CARE_PROVIDER_SITE_OTHER): Payer: Commercial Managed Care - PPO | Admitting: Cardiology

## 2011-04-27 ENCOUNTER — Encounter: Payer: Self-pay | Admitting: Physical Therapy

## 2011-04-27 DIAGNOSIS — R0683 Snoring: Secondary | ICD-10-CM

## 2011-04-27 DIAGNOSIS — E663 Overweight: Secondary | ICD-10-CM | POA: Insufficient documentation

## 2011-04-27 DIAGNOSIS — R5381 Other malaise: Secondary | ICD-10-CM

## 2011-04-27 DIAGNOSIS — I4949 Other premature depolarization: Secondary | ICD-10-CM

## 2011-04-27 DIAGNOSIS — I493 Ventricular premature depolarization: Secondary | ICD-10-CM

## 2011-04-27 DIAGNOSIS — R5383 Other fatigue: Secondary | ICD-10-CM

## 2011-04-27 NOTE — Assessment & Plan Note (Signed)
We discussed specific strategies for weight loss.

## 2011-04-27 NOTE — Assessment & Plan Note (Signed)
She has otherwise had a negative workup. No further testing or therapy is suggested.

## 2011-04-27 NOTE — Patient Instructions (Signed)
Please wear knee-high compression stockings daily.  You may remove them at bedtime.  Your physician has recommended that you have a sleep study. This test records several body functions during sleep, including: brain activity, eye movement, oxygen and carbon dioxide blood levels, heart rate and rhythm, breathing rate and rhythm, the flow of air through your mouth and nose, snoring, body muscle movements, and chest and belly movement.  The current medical regimen is effective;  continue present plan and medications.  Follow up in 4 months with Dr Percival Spanish.  You will receive a letter in the mail 2 months before you are due.  Please call us when you receive this letter to schedule your follow up appointment.   2 Gram Low Sodium Diet A 2 gram sodium diet restricts the amount of sodium in the diet to no more than 2 g or 2000 mg daily. Limiting the amount of sodium is often used to help lower blood pressure. It is important if you have heart, liver, or kidney problems. Many foods contain sodium for flavor and sometimes as a preservative. When the amount of sodium in a diet needs to be low, it is important to know what to look for when choosing foods and drinks. The following includes some information and guidelines to help make it easier for you to adapt to a low sodium diet. QUICK TIPS  Do not add salt to food.   Avoid convenience items and fast food.   Choose unsalted snack foods.   Buy lower sodium products, often labeled as "lower sodium" or "no salt added."   Check food labels to learn how much sodium is in 1 serving.   When eating at a restaurant, ask that your food be prepared with less salt or none, if possible.  READING FOOD LABELS FOR SODIUM INFORMATION The nutrition facts label is a good place to find how much sodium is in foods. Look for products with no more than 500 to 600 mg of sodium per meal and no more than 150 mg per serving. Remember that 2 g = 2000 mg. The food label may  also list foods as:  Sodium-free: Less than 5 mg in a serving.   Very low sodium: 35 mg or less in a serving.   Low-sodium: 140 mg or less in a serving.   Light in sodium: 50% less sodium in a serving. For example, if a food that usually has 300 mg of sodium is changed to become light in sodium, it will have 150 mg of sodium.   Reduced sodium: 25% less sodium in a serving. For example, if a food that usually has 400 mg of sodium is changed to reduced sodium, it will have 300 mg of sodium.  CHOOSING FOODS Grains  Avoid: Salted crackers and snack items. Some cereals, including instant hot cereals. Bread stuffing and biscuit mixes. Seasoned rice or pasta mixes.   Choose: Unsalted snack items. Low-sodium cereals, oats, puffed wheat and rice, shredded wheat. English muffins and bread. Pasta.  Meats  Avoid: Salted, canned, smoked, spiced, pickled meats, including fish and poultry. Bacon, ham, sausage, cold cuts, hot dogs, anchovies.   Choose: Low-sodium canned tuna and salmon. Fresh or frozen meat, poultry, and fish.  Dairy  Avoid: Processed cheese and spreads. Cottage cheese. Buttermilk and condensed milk. Regular cheese.   Choose: Milk. Low-sodium cottage cheese. Yogurt. Sour cream. Low-sodium cheese.  Fruits and Vegetables  Avoid: Regular canned vegetables. Regular canned tomato sauce and paste. Frozen vegetables in sauces.  Olives. Angie Fava. Relishes. Sauerkraut.   Choose: Low-sodium canned vegetables. Low-sodium tomato sauce and paste. Frozen or fresh vegetables. Fresh and frozen fruit.  Condiments  Avoid: Canned and packaged gravies. Worcestershire sauce. Tartar sauce. Barbecue sauce. Soy sauce. Steak sauce. Ketchup. Onion, garlic, and table salt. Meat flavorings and tenderizers.   Choose: Fresh and dried herbs and spices. Low-sodium varieties of mustard and ketchup. Lemon juice. Tabasco sauce. Horseradish.  SAMPLE 2 GRAM SODIUM MEAL PLAN Breakfast / Sodium (mg)  1 cup low-fat  milk / [id number] mg   2 slices whole-wheat toast / 270 mg   1 tbs heart-healthy margarine / 153 mg   1 hard-boiled egg / 139 mg   1 small orange / 0 mg  Lunch / Sodium (mg)  1 cup raw carrots / 76 mg    cup hummus / 298 mg   1 cup low-fat milk / 143 mg    cup red grapes / 2 mg   1 whole-wheat pita bread / 356 mg  Dinner / Sodium (mg)  1 cup whole-wheat pasta / 2 mg   1 cup low-sodium tomato sauce / 73 mg   3 oz lean ground beef / 57 mg   1 small side salad (1 cup raw spinach leaves,  cup cucumber,  cup yellow bell pepper) with 1 tsp olive oil and 1 tsp red wine vinegar / 25 mg  Snack / Sodium (mg)  1 container low-fat vanilla yogurt / 107 mg   3 graham cracker squares / 127 mg  Nutrient Analysis  Calories: 2033   Protein: 77 g   Carbohydrate: 282 g   Fat: 72 g   Sodium: 1971 mg  Document Released: 02/06/2005 Document Revised: 01/26/2011 Document Reviewed: 05/10/2009 [facility] Patient Information 2012 [place], [place].

## 2011-04-27 NOTE — Progress Notes (Signed)
HPI The patient presents for evaluation of shortness of breath.  I was impressed by her symptoms. I sent her for a pro BNP level which was slightly high. Echocardiogram demonstrated mild LVH. Stress perfusion imaging was negative for any evidence of ischemia. I had wanted to do a CT coronary angiogram she had ventricular ectopy which precluded this. She's had no new symptoms since I last saw her. Her previous symptoms are described on the last note. Of note she does snore loudly and she has excessive daytime somnolence.  Allergies  Allergen Reactions  . Biaxin Hives  . Vicodin (Hydrocodone-Acetaminophen) Nausea And Vomiting  . Byetta     Current Outpatient Prescriptions  Medication Sig Dispense Refill  . ACTOS 45 MG tablet TAKE 1 TABLET BY MOUTH DAILY  90 tablet  PRN  . albuterol (PROVENTIL) (5 MG/ML) 0.5% nebulizer solution Take 2.5 mg by nebulization every 6 (six) hours as needed.        Marland Kitchen atorvastatin (LIPITOR) 20 MG tablet TAKE 1 TABLET BY MOUTH DAILY  90 tablet  0  . budesonide (RHINOCORT AQUA) 32 MCG/ACT nasal spray Place 1 spray into the nose daily.        Marland Kitchen glipiZIDE (GLUCOTROL) 10 MG tablet Take 10 mg by mouth 2 (two) times daily before a meal.        . loratadine (CLARITIN) 10 MG tablet Take 10 mg by mouth daily. prn      . metFORMIN (GLUCOPHAGE) 1000 MG tablet Take 1,000 mg by mouth 2 (two) times daily with a meal.        . METOPROLOL SUCCINATE PO Take 50 mg by mouth 1 dose over 24 hours.        . multivitamin (THERAGRAN) per tablet Take 1 tablet by mouth daily.        Marland Kitchen PREMARIN 0.3 MG tablet TAKE 1 TABLET BY MOUTH ONCE DAILY FOR 21 DAYS THEN DO NOT TAKE FOR 7 DAYS  30 tablet  1  . sitaGLIPtin (JANUVIA) 100 MG tablet Take 100 mg by mouth daily.        . SUMAtriptan (IMITREX) 100 MG tablet Take 100 mg by mouth every 2 (two) hours as needed.        . SYMBICORT 160-4.5 MCG/ACT inhaler INHALE 2 PUFFS BY MOUTH TWICE DAILY  3 Inhaler  PRN  . VENTOLIN HFA 108 (90 BASE) MCG/ACT  inhaler INHALE 2 SPRAYS BY MOUTH FOUR TIMES DAILY  54 each  PRN    Past Medical History  Diagnosis Date  . Asthma with COPD with status asthmaticus   . Allergy   . Acne rosacea   . GERD (gastroesophageal reflux disease)   . Diabetes mellitus   . Migraines   . Hot flashes   . Anemia   . Abnormal vaginal Pap smear   . Anxiety   . Depression     Past Surgical History  Procedure Date  . Abdominal hysterectomy     ROS:  As stated in the HPI and negative for all other systems.  PHYSICAL EXAM BP 131/84  Pulse 69  Ht 5\' 8"  (1.727 m)  Wt 238 lb (107.956 kg)  BMI 36.19 kg/m2 GENERAL:  Well appearing HEENT:  Pupils equal round and reactive, fundi not visualized, oral mucosa unremarkable NECK:  No jugular venous distention, waveform within normal limits, carotid upstroke brisk and symmetric, no bruits, no thyromegaly LYMPHATICS:  No cervical, inguinal adenopathy LUNGS:  Clear to auscultation bilaterally BACK:  No CVA tenderness CHEST:  Unremarkable HEART:  PMI not displaced or sustained,S1 and S2 within normal limits, no S3, no S4, no clicks, no rubs, no murmurs ABD:  Flat, positive bowel sounds normal in frequency in pitch, no bruits, no rebound, no guarding, no midline pulsatile mass, no hepatomegaly, no splenomegaly EXT:  2 plus pulses throughout, mild/mod edema, no cyanosis no clubbing SKIN:  No rashes no nodules NEURO:  Cranial nerves II through XII grossly intact, motor grossly intact throughout PSYCH:  Cognitively intact, oriented to person place and time  ASSESSMENT AND PLAN

## 2011-04-27 NOTE — Assessment & Plan Note (Signed)
There may be some element of diastolic dysfunction. We discussed salt restriction, fluid management, compression stockings for her edema.

## 2011-04-27 NOTE — Assessment & Plan Note (Signed)
This is a significant complaint. She has snoring. She has daytime somnolence. This could tied together some of her other findings. She will get a sleep study.

## 2011-05-01 ENCOUNTER — Encounter: Payer: Self-pay | Admitting: Physical Therapy

## 2011-05-02 ENCOUNTER — Telehealth: Payer: Self-pay | Admitting: *Deleted

## 2011-05-02 NOTE — Telephone Encounter (Signed)
No show for previsit.  Left message on pts. Cell phone.  Sent no show letter

## 2011-05-03 ENCOUNTER — Encounter: Payer: Self-pay | Admitting: Physical Therapy

## 2011-05-04 ENCOUNTER — Other Ambulatory Visit (HOSPITAL_COMMUNITY): Payer: Self-pay | Admitting: Orthopedic Surgery

## 2011-05-04 DIAGNOSIS — M25519 Pain in unspecified shoulder: Secondary | ICD-10-CM

## 2011-05-08 ENCOUNTER — Ambulatory Visit (HOSPITAL_COMMUNITY)
Admission: RE | Admit: 2011-05-08 | Discharge: 2011-05-08 | Disposition: A | Discharge status: Home / Self Care | Payer: PRIVATE HEALTH INSURANCE | Source: Ambulatory Visit | Attending: Orthopedic Surgery | Admitting: Orthopedic Surgery

## 2011-05-08 DIAGNOSIS — R229 Localized swelling, mass and lump, unspecified: Secondary | ICD-10-CM | POA: Insufficient documentation

## 2011-05-08 DIAGNOSIS — M67919 Unspecified disorder of synovium and tendon, unspecified shoulder: Secondary | ICD-10-CM | POA: Insufficient documentation

## 2011-05-08 DIAGNOSIS — M719 Bursopathy, unspecified: Secondary | ICD-10-CM | POA: Insufficient documentation

## 2011-05-08 DIAGNOSIS — M25519 Pain in unspecified shoulder: Secondary | ICD-10-CM | POA: Insufficient documentation

## 2011-05-16 ENCOUNTER — Encounter: Payer: Commercial Managed Care - PPO | Admitting: Internal Medicine

## 2011-05-16 ENCOUNTER — Ambulatory Visit: Payer: Commercial Managed Care - PPO | Admitting: Internal Medicine

## 2011-05-21 ENCOUNTER — Ambulatory Visit (HOSPITAL_BASED_OUTPATIENT_CLINIC_OR_DEPARTMENT_OTHER): Payer: 59 | Attending: Cardiology | Admitting: Radiology

## 2011-05-21 VITALS — Ht 68.0 in | Wt 230.0 lb

## 2011-05-21 DIAGNOSIS — G4733 Obstructive sleep apnea (adult) (pediatric): Secondary | ICD-10-CM

## 2011-05-21 DIAGNOSIS — G4710 Hypersomnia, unspecified: Secondary | ICD-10-CM | POA: Insufficient documentation

## 2011-05-21 DIAGNOSIS — R0683 Snoring: Secondary | ICD-10-CM

## 2011-05-21 DIAGNOSIS — R5383 Other fatigue: Secondary | ICD-10-CM

## 2011-05-22 DIAGNOSIS — J309 Allergic rhinitis, unspecified: Secondary | ICD-10-CM | POA: Insufficient documentation

## 2011-05-22 DIAGNOSIS — Z8659 Personal history of other mental and behavioral disorders: Secondary | ICD-10-CM | POA: Insufficient documentation

## 2011-05-22 DIAGNOSIS — K219 Gastro-esophageal reflux disease without esophagitis: Secondary | ICD-10-CM | POA: Insufficient documentation

## 2011-05-22 DIAGNOSIS — J45909 Unspecified asthma, uncomplicated: Secondary | ICD-10-CM | POA: Insufficient documentation

## 2011-05-22 NOTE — Progress Notes (Signed)
Subjective:    Patient ID: Samantha Hahn, female    DOB: 11-Nov-1960, 51 y.o.   MRN: [id number]  HPI 51 year old white female with history of migraine headaches, allergic rhinitis, acne rosacea, adult onset diabetes type 2 an obese, GE reflux, vasomotor symptoms for health maintenance and evaluation of medical problems. Patient had a hysterectomy in 2006. Patient says she has been told she is bipolar. Followed by Dr. Sheralyn Boatman & Gala Murdoch. 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 She enjoys this much better. Patient had a cardiac evaluation in 2007 consisting of a nuclear medicine study which was negative. 2-D echocardiogram was normal. At that time she had a history of palpitations. Diabetes is managed by Dr. Elyse Hsu. In 2006 had GI consult for diarrhea, nausea weight loss and abdominal bloating. Symptoms seem to be related to vaginal hysterectomy. Patient was treated with Nexium and was given Flagyl for 5 days. Has been allergy tested by Dr. Neldon Mc in 2001. Allergy skin test were positive to grasses weeds trees dog in house dust mite. She also has Parada treat. FVC was 78% of predicted, FEV1 was 67% of predicted, FEF 25-75 was 1.28 which was 39% of predicted indicating some small airways disease. Following albuterol FEV1 did not change significantly. Patient was diagnosed with asthma. She was also diagnosed with GE reflux with component of reflux inducing respiratory disease. Was placed on Pulmicort and Rhinocort aqua. Was also placed on Allegra-D and Patanol eyedrops.  In 2011 patient fail at home and had a brief loss of consciousness. Was diagnosed with a minor head injury. CT of the C-spine and head were normal.  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 Does not smoke or consume alcohol. Has one daughter.  Family history Brother with history of hypertrophic cardiomyopathy. One sister in good health. Parents  in good health. GYN is Dr. Laurell Josephs.    Review of Systems  Constitutional: Negative.   HENT: Negative.   Cardiovascular: Negative.   Gastrointestinal: Negative.   Genitourinary: Negative.   Musculoskeletal: Negative.   Neurological: Negative.   Hematological: Negative.   Psychiatric/Behavioral: Positive for dysphoric mood.       Objective:   Physical Exam  Vitals reviewed. Constitutional: She is oriented to person, place, and time. She appears well-developed and well-nourished. No distress.  HENT:  Head: Normocephalic and atraumatic.  Right Ear: External ear normal.  Left Ear: External ear normal.  Eyes: Conjunctivae are normal. Pupils are equal, round, and reactive to light. Right eye exhibits no discharge. Left eye exhibits no discharge. No scleral icterus.  Neck: Neck supple. No JVD present. No thyromegaly present.  Cardiovascular: Normal rate, regular rhythm, normal heart sounds and intact distal pulses.   No murmur heard. Pulmonary/Chest: Effort normal and breath sounds normal. She has no wheezes. She has no rales.  Abdominal: Bowel sounds are normal. She exhibits no distension and no mass. There is no tenderness. There is no rebound and no guarding.  Genitourinary:       Deferred  Musculoskeletal: She exhibits no edema.  Lymphadenopathy:    She has no cervical adenopathy.  Neurological: She is alert and oriented to person, place, and time. She has normal reflexes. No cranial nerve deficit. Coordination normal.  Skin: Skin is warm and dry. No rash noted. She is not diaphoretic.  Psychiatric: She has a normal mood and affect. Her behavior is normal. Judgment and thought content normal.  Assessment & Plan:  Adult onset diabetes mellitus in obese  GE reflux  Asthma  Allergic rhinitis  History of migraine headaches  Plan: Continue same medications and return in 6 months. Continue followup with Dr. Elyse Hsu.  Addendum, patient is complaining of  some shortness of breath with minimal exertion when walking from her car to her job. She wants to have cardiology evaluation. We'll arrange this.

## 2011-05-24 ENCOUNTER — Encounter (HOSPITAL_COMMUNITY): Payer: Self-pay | Admitting: Pharmacy Technician

## 2011-06-01 ENCOUNTER — Encounter (HOSPITAL_COMMUNITY)
Admission: RE | Admit: 2011-06-01 | Discharge: 2011-06-01 | Disposition: A | Discharge status: Home / Self Care | Payer: PRIVATE HEALTH INSURANCE | Source: Ambulatory Visit | Attending: Orthopedic Surgery | Admitting: Orthopedic Surgery

## 2011-06-01 ENCOUNTER — Encounter (HOSPITAL_COMMUNITY): Payer: Self-pay

## 2011-06-01 ENCOUNTER — Other Ambulatory Visit: Payer: Self-pay

## 2011-06-01 DIAGNOSIS — Z01812 Encounter for preprocedural laboratory examination: Secondary | ICD-10-CM | POA: Insufficient documentation

## 2011-06-01 DIAGNOSIS — Z01818 Encounter for other preprocedural examination: Secondary | ICD-10-CM | POA: Insufficient documentation

## 2011-06-01 DIAGNOSIS — Z538 Procedure and treatment not carried out for other reasons: Secondary | ICD-10-CM | POA: Insufficient documentation

## 2011-06-01 HISTORY — DX: Cardiac arrhythmia, unspecified: I49.9

## 2011-06-01 LAB — CBC
Hemoglobin: 12.5 g/dL (ref 12.0–15.0)
MCV: 91.1 fL (ref 78.0–100.0)
Platelets: 223 10*3/uL (ref 150–400)
RBC: 4.14 MIL/uL (ref 3.87–5.11)
WBC: 7 10*3/uL (ref 4.0–10.5)

## 2011-06-01 LAB — BASIC METABOLIC PANEL
CO2: 24 mEq/L (ref 19–32)
Chloride: 103 mEq/L (ref 96–112)
Creatinine, Ser: 1.14 mg/dL — ABNORMAL HIGH (ref 0.50–1.10)

## 2011-06-01 LAB — SURGICAL PCR SCREEN
MRSA, PCR: NEGATIVE
Staphylococcus aureus: NEGATIVE

## 2011-06-01 MED ORDER — ATORVASTATIN CALCIUM 20 MG PO TABS: 20.0000 mg | ORAL_TABLET | Freq: Every day | ORAL | Status: DC

## 2011-06-01 NOTE — Pre-Procedure Instructions (Signed)
20 Samantha Hahn  06/01/2011   Your procedure is scheduled on:  06/08/11  Report to [place] at 1030 AM.  Call this number if you have problems the morning of surgery: [phone]   Remember:   Do not eat food:After Midnight.  May have clear liquids: up to 4 Hours before arrival.  Clear liquids include soda, tea, black coffee, apple or grape juice, broth.  Take these medicines the morning of surgery with A SIP OF WATER: all inhalers,metorolol,premarin   Do not wear jewelry, make-up or nail polish.  Do not wear lotions, powders, or perfumes. You may wear deodorant.  Do not shave 48 hours prior to surgery.  Do not bring valuables to the hospital.  Contacts, dentures or bridgework may not be worn into surgery.  Leave suitcase in the car. After surgery it may be brought to your room.  For patients admitted to the hospital, checkout time is 11:00 AM the day of discharge.   Patients discharged the day of surgery will not be allowed to drive home.  Name and phone number of your driver: family  Special Instructions: CHG Shower Use Special Wash: 1/2 bottle night before surgery and 1/2 bottle morning of surgery.   Please read over the following fact sheets that you were given: Pain Booklet, Coughing and Deep Breathing, MRSA Information and Surgical Site Infection Prevention

## 2011-06-01 NOTE — Progress Notes (Signed)
Stress test, echo , ekg in epic

## 2011-06-07 DIAGNOSIS — G4730 Sleep apnea, unspecified: Secondary | ICD-10-CM

## 2011-06-07 DIAGNOSIS — G4710 Hypersomnia, unspecified: Secondary | ICD-10-CM

## 2011-06-07 MED ORDER — CEFAZOLIN SODIUM-DEXTROSE 2-3 GM-% IV SOLR
2.0000 g | INTRAVENOUS | Status: AC
  Administered 2011-06-08: 2 g via INTRAVENOUS
  Filled 2011-06-07: qty 50

## 2011-06-07 NOTE — Procedures (Signed)
NAME:  Samantha Hahn, Samantha Hahn NO.:  [id number]  MEDICAL RECORD NO.:  [id number]          PATIENT TYPE:  OUT  LOCATION:  SLEEP CENTER                 FACILITY:  [facility]  PHYSICIAN:  Kathee Delton, MD,FCCPDATE OF BIRTH:  May 01[birth year] 1959  DATE OF STUDY:  05/21/2011                           NOCTURNAL POLYSOMNOGRAM  REFERRING PHYSICIAN:  Minus Breeding, MD, [facility]  INDICATION FOR STUDY:  Hypersomnia with sleep apnea.  EPWORTH SLEEPINESS SCORE:  11.  MEDICATIONS:  SLEEP ARCHITECTURE:  The patient had total sleep time of only 103 minutes with very little slow-wave sleep and never achieved REM.  Sleep onset latency was prolonged at 174 minutes, and sleep efficiency was poor at 27%.  RESPIRATORY DATA:  The patient was found to have 3 apneas and 3 obstructive hypopneas, giving her an AHI of only 2 events per hour.  The events occurred primarily in the supine position.  There was mild snoring noted throughout.  OXYGEN DATA:  O2 desaturation as low as 91% with the patient's obstructive events.  CARDIAC DATA:  Occasional PVC noted, but no clinically significant arrhythmias were seen.  MOVEMENT-PARASOMNIA:  The patient had no significant leg jerks or other abnormal behaviors noted.  IMPRESSIONS-RECOMMENDATIONS: 1. Small numbers of obstructive events, which do not meet the AHI     criteria for the obstructive sleep apnea syndrome.  However, the     patient only had a total sleep time of 103 minutes and never     achieved REM.  Clinical correlation is suggested.  If there is a     significant high suspicion for the presence of obstructive sleep     apnea, the patient could have a repeat study in the sleep center     with a sedative hypnotic, or consider home sleep testing in her     normal sleeping environment. 2. Occasional PVC noted, but no clinically significant arrhythmias     were seen.    Kathee Delton, MD,FCCP Diplomate, [place] Board of  Sleep Medicine   [facility]/MEDQ  D:  06/07/2011 06:47:41  T:  06/07/2011 07:32:46  Job:  [id number]

## 2011-06-08 ENCOUNTER — Encounter (HOSPITAL_COMMUNITY): Payer: Self-pay | Admitting: *Deleted

## 2011-06-08 ENCOUNTER — Encounter (HOSPITAL_COMMUNITY)
Admission: RE | Disposition: A | Discharge status: Home / Self Care | Payer: Self-pay | Source: Ambulatory Visit | Attending: Orthopedic Surgery

## 2011-06-08 ENCOUNTER — Ambulatory Visit (HOSPITAL_COMMUNITY)
Admission: RE | Admit: 2011-06-08 | Discharge: 2011-06-08 | Disposition: A | Discharge status: Home / Self Care | Payer: PRIVATE HEALTH INSURANCE | Source: Ambulatory Visit | Attending: Orthopedic Surgery | Admitting: Orthopedic Surgery

## 2011-06-08 ENCOUNTER — Encounter (HOSPITAL_COMMUNITY): Payer: Self-pay | Admitting: Anesthesiology

## 2011-06-08 ENCOUNTER — Ambulatory Visit (HOSPITAL_COMMUNITY): Payer: PRIVATE HEALTH INSURANCE | Admitting: Anesthesiology

## 2011-06-08 DIAGNOSIS — E119 Type 2 diabetes mellitus without complications: Secondary | ICD-10-CM | POA: Insufficient documentation

## 2011-06-08 DIAGNOSIS — M25819 Other specified joint disorders, unspecified shoulder: Secondary | ICD-10-CM | POA: Insufficient documentation

## 2011-06-08 DIAGNOSIS — K219 Gastro-esophageal reflux disease without esophagitis: Secondary | ICD-10-CM | POA: Insufficient documentation

## 2011-06-08 DIAGNOSIS — J4489 Other specified chronic obstructive pulmonary disease: Secondary | ICD-10-CM | POA: Insufficient documentation

## 2011-06-08 DIAGNOSIS — M7500 Adhesive capsulitis of unspecified shoulder: Secondary | ICD-10-CM | POA: Insufficient documentation

## 2011-06-08 DIAGNOSIS — M19019 Primary osteoarthritis, unspecified shoulder: Secondary | ICD-10-CM | POA: Insufficient documentation

## 2011-06-08 DIAGNOSIS — Z9889 Other specified postprocedural states: Secondary | ICD-10-CM

## 2011-06-08 DIAGNOSIS — J449 Chronic obstructive pulmonary disease, unspecified: Secondary | ICD-10-CM | POA: Insufficient documentation

## 2011-06-08 HISTORY — PX: SHOULDER ARTHROSCOPY: SHX128

## 2011-06-08 LAB — GLUCOSE, CAPILLARY: Glucose-Capillary: 156 mg/dL — ABNORMAL HIGH (ref 70–99)

## 2011-06-08 SURGERY — ARTHROSCOPY, SHOULDER
Anesthesia: General | Site: Shoulder | Laterality: Left | Wound class: Clean

## 2011-06-08 MED ORDER — MORPHINE SULFATE 2 MG/ML IJ SOLN: 0.0500 mg/kg | INTRAMUSCULAR | Status: DC | PRN

## 2011-06-08 MED ORDER — DIAZEPAM 2 MG PO TABS: 2.0000 mg | ORAL_TABLET | Freq: Four times a day (QID) | ORAL | Status: AC | PRN

## 2011-06-08 MED ORDER — LACTATED RINGERS IV SOLN
INTRAVENOUS | Status: DC | PRN
  Administered 2011-06-08 (×2): via INTRAVENOUS

## 2011-06-08 MED ORDER — BUPIVACAINE-EPINEPHRINE PF 0.5-1:200000 % IJ SOLN
INTRAMUSCULAR | Status: DC | PRN
  Administered 2011-06-08: 25 mL

## 2011-06-08 MED ORDER — HYDROMORPHONE HCL PF 1 MG/ML IJ SOLN: 0.2500 mg | INTRAMUSCULAR | Status: DC | PRN

## 2011-06-08 MED ORDER — MIDAZOLAM HCL 5 MG/5ML IJ SOLN
INTRAMUSCULAR | Status: DC | PRN
  Administered 2011-06-08: 2 mg via INTRAVENOUS

## 2011-06-08 MED ORDER — PROPOFOL 10 MG/ML IV EMUL
INTRAVENOUS | Status: DC | PRN
  Administered 2011-06-08: 200 mg via INTRAVENOUS

## 2011-06-08 MED ORDER — ONDANSETRON HCL 4 MG/2ML IJ SOLN
INTRAMUSCULAR | Status: DC | PRN
  Administered 2011-06-08: 4 mg via INTRAVENOUS

## 2011-06-08 MED ORDER — EPHEDRINE SULFATE 50 MG/ML IJ SOLN
INTRAMUSCULAR | Status: DC | PRN
  Administered 2011-06-08: 15 mg via INTRAVENOUS

## 2011-06-08 MED ORDER — ONDANSETRON HCL 4 MG/2ML IJ SOLN: 4.0000 mg | Freq: Once | INTRAMUSCULAR | Status: DC | PRN

## 2011-06-08 MED ORDER — MIDAZOLAM HCL 2 MG/2ML IJ SOLN
1.0000 mg | INTRAMUSCULAR | Status: DC | PRN
  Administered 2011-06-08: 2 mg via INTRAVENOUS

## 2011-06-08 MED ORDER — FENTANYL CITRATE 0.05 MG/ML IJ SOLN
50.0000 ug | INTRAMUSCULAR | Status: DC | PRN
  Administered 2011-06-08: 100 ug via INTRAVENOUS

## 2011-06-08 MED ORDER — TEMAZEPAM 30 MG PO CAPS: 30.0000 mg | ORAL_CAPSULE | Freq: Every evening | ORAL | Status: DC | PRN

## 2011-06-08 MED ORDER — NEOSTIGMINE METHYLSULFATE 1 MG/ML IJ SOLN
INTRAMUSCULAR | Status: DC | PRN
  Administered 2011-06-08: 3 mg via INTRAVENOUS

## 2011-06-08 MED ORDER — FENTANYL CITRATE 0.05 MG/ML IJ SOLN
INTRAMUSCULAR | Status: DC | PRN
  Administered 2011-06-08 (×2): 50 ug via INTRAVENOUS
  Administered 2011-06-08: 100 ug via INTRAVENOUS

## 2011-06-08 MED ORDER — ROCURONIUM BROMIDE 100 MG/10ML IV SOLN
INTRAVENOUS | Status: DC | PRN
  Administered 2011-06-08: 50 mg via INTRAVENOUS

## 2011-06-08 MED ORDER — MIDAZOLAM HCL 2 MG/2ML IJ SOLN
INTRAMUSCULAR | Status: AC
  Filled 2011-06-08: qty 2

## 2011-06-08 MED ORDER — GLYCOPYRROLATE 0.2 MG/ML IJ SOLN
INTRAMUSCULAR | Status: DC | PRN
  Administered 2011-06-08: .4 mg via INTRAVENOUS

## 2011-06-08 MED ORDER — FENTANYL CITRATE 0.05 MG/ML IJ SOLN
INTRAMUSCULAR | Status: AC
  Filled 2011-06-08: qty 2

## 2011-06-08 MED ORDER — MELOXICAM 15 MG PO TABS: 15.0000 mg | ORAL_TABLET | Freq: Every day | ORAL | Status: DC

## 2011-06-08 MED ORDER — CHLORHEXIDINE GLUCONATE 4 % EX LIQD: 60.0000 mL | Freq: Once | CUTANEOUS | Status: DC

## 2011-06-08 MED ORDER — LACTATED RINGERS IV SOLN
INTRAVENOUS | Status: DC
  Administered 2011-06-08: 12:00:00 via INTRAVENOUS

## 2011-06-08 MED ORDER — SODIUM CHLORIDE 0.9 % IR SOLN
Status: DC | PRN
  Administered 2011-06-08 (×2): 3000 mL
  Administered 2011-06-08: 1000 mL

## 2011-06-08 MED ORDER — HYDROMORPHONE HCL 2 MG PO TABS: 2.0000 mg | ORAL_TABLET | ORAL | Status: AC | PRN

## 2011-06-08 SURGICAL SUPPLY — 54 items
BLADE CUTTER GATOR 3.5 (BLADE) ×2 IMPLANT
BLADE GREAT WHITE 4.2 (BLADE) ×2 IMPLANT
BLADE SURG 11 STRL SS (BLADE) ×2 IMPLANT
BOOTCOVER CLEANROOM LRG (PROTECTIVE WEAR) ×4 IMPLANT
BUR OVAL 4.0 (BURR) ×2 IMPLANT
CANISTER SUCT LVC 12 LTR MEDI- (MISCELLANEOUS) ×2 IMPLANT
CANNULA ACUFLEX KIT 5X76 (CANNULA) ×2 IMPLANT
CLOTH BEACON ORANGE TIMEOUT ST (SAFETY) ×2 IMPLANT
CLSR STERI-STRIP ANTIMIC 1/2X4 (GAUZE/BANDAGES/DRESSINGS) ×2 IMPLANT
DRAPE INCISE 23X17 IOBAN STRL (DRAPES) ×1
DRAPE INCISE IOBAN 23X17 STRL (DRAPES) ×1 IMPLANT
DRAPE STERI 35X30 U-POUCH (DRAPES) ×2 IMPLANT
DRAPE SURG 17X11 SM STRL (DRAPES) ×2 IMPLANT
DRAPE U-SHAPE 47X51 STRL (DRAPES) ×2 IMPLANT
DRSG PAD ABDOMINAL 8X10 ST (GAUZE/BANDAGES/DRESSINGS) IMPLANT
DURAPREP 26ML APPLICATOR (WOUND CARE) ×2 IMPLANT
GLOVE BIO SURGEON STRL SZ7.5 (GLOVE) ×2 IMPLANT
GLOVE BIO SURGEON STRL SZ8 (GLOVE) ×2 IMPLANT
GLOVE BIOGEL PI IND STRL 6.5 (GLOVE) ×1 IMPLANT
GLOVE BIOGEL PI INDICATOR 6.5 (GLOVE) ×1
GLOVE EUDERMIC 7 POWDERFREE (GLOVE) ×2 IMPLANT
GLOVE SS BIOGEL STRL SZ 6.5 (GLOVE) ×1 IMPLANT
GLOVE SS BIOGEL STRL SZ 7.5 (GLOVE) ×1 IMPLANT
GLOVE SUPERSENSE BIOGEL SZ 6.5 (GLOVE) ×1
GLOVE SUPERSENSE BIOGEL SZ 7.5 (GLOVE) ×1
GOWN STRL NON-REIN LRG LVL3 (GOWN DISPOSABLE) ×2 IMPLANT
GOWN STRL REIN XL XLG (GOWN DISPOSABLE) ×4 IMPLANT
KIT BASIN OR (CUSTOM PROCEDURE TRAY) ×2 IMPLANT
KIT ROOM TURNOVER OR (KITS) ×2 IMPLANT
KIT SHOULDER TRACTION (DRAPES) ×2 IMPLANT
MANIFOLD NEPTUNE II (INSTRUMENTS) ×2 IMPLANT
NDL SUT 6 .5 CRC .975X.05 MAYO (NEEDLE) IMPLANT
NEEDLE MAYO TAPER (NEEDLE)
NEEDLE SPNL 18GX3.5 QUINCKE PK (NEEDLE) ×2 IMPLANT
NS IRRIG 1000ML POUR BTL (IV SOLUTION) ×2 IMPLANT
PACK SHOULDER (CUSTOM PROCEDURE TRAY) ×2 IMPLANT
PAD ARMBOARD 7.5X6 YLW CONV (MISCELLANEOUS) ×4 IMPLANT
SET ARTHROSCOPY TUBING (MISCELLANEOUS) ×1
SET ARTHROSCOPY TUBING LN (MISCELLANEOUS) ×1 IMPLANT
SLING ARM FOAM STRAP MED (SOFTGOODS) IMPLANT
SLING ARM IMMOBILIZER LRG (SOFTGOODS) IMPLANT
SLING ARM IMMOBILIZER MED (SOFTGOODS) IMPLANT
SPONGE GAUZE 4X4 12PLY (GAUZE/BANDAGES/DRESSINGS) IMPLANT
SPONGE LAP 4X18 X RAY DECT (DISPOSABLE) ×2 IMPLANT
STRIP CLOSURE SKIN 1/2X4 (GAUZE/BANDAGES/DRESSINGS) IMPLANT
SUT MNCRL AB 3-0 PS2 18 (SUTURE) ×2 IMPLANT
SUT PDS AB 1 CT  36 (SUTURE) ×1
SUT PDS AB 1 CT 36 (SUTURE) ×1 IMPLANT
SYR 20CC LL (SYRINGE) IMPLANT
TAPE PAPER 3X10 WHT MICROPORE (GAUZE/BANDAGES/DRESSINGS) IMPLANT
TOWEL OR 17X24 6PK STRL BLUE (TOWEL DISPOSABLE) ×2 IMPLANT
TOWEL OR 17X26 10 PK STRL BLUE (TOWEL DISPOSABLE) ×2 IMPLANT
WAND SUCTION MAX 4MM 90S (SURGICAL WAND) ×2 IMPLANT
WATER STERILE IRR 1000ML POUR (IV SOLUTION) ×2 IMPLANT

## 2011-06-08 NOTE — Preoperative (Signed)
Beta Blockers   Reason not to administer Beta Blockers:Not Applicable 

## 2011-06-08 NOTE — Op Note (Signed)
06/08/2011  2:45 PM  PATIENT:   Despina Pole  51 y.o. female  PRE-OPERATIVE DIAGNOSIS:  LEFT SHOULDER ADHESIVE CAPSULITIS  POST-OPERATIVE DIAGNOSIS:  Same  PROCEDURE:  LSA, MUA,LOA,SAD,DCR, capsular release  SURGEON:  Geroldine Esquivias, Metta Clines M.D.  ASSISTANTS: Shuford pac   ANESTHESIA:   GET + ISB  EBL: min  SPECIMEN:  none  Drains: none   PATIENT DISPOSITION:  PACU - hemodynamically stable.    PLAN OF CARE: Discharge to home after PACU  Dictation# [id number]

## 2011-06-08 NOTE — Anesthesia Procedure Notes (Addendum)
Anesthesia Regional Block:  Interscalene brachial plexus block  Pre-Anesthetic Checklist: ,, timeout performed, Correct Patient, Correct Site, Correct Laterality, Correct Procedure, Correct Position, site marked, Risks and benefits discussed,  Surgical consent,  Pre-op evaluation,  At surgeon's request and post-op pain management  Laterality: Left and Upper  Prep: chloraprep       Needles:  Injection technique: Single-shot  Needle Type: Echogenic Needle     Needle Length: 5cm 5 cm Needle Gauge: 22 and 22 G    Additional Needles:  Procedures: ultrasound guided Interscalene brachial plexus block Narrative:  Start time: 06/08/2011 12:22 PM End time: 06/08/2011 12:30 PM Injection made incrementally with aspirations every 5 mL.  Performed by: Personally  Anesthesiologist: Lorrene Reid  Additional Notes: Marcaine 0.5% with 1:200000 Epi  25 ml  Supraclavicular block Procedure Name: Intubation Date/Time: 06/08/2011 1:38 PM Performed by: Julian Reil Pre-anesthesia Checklist: Patient identified, Emergency Drugs available, Suction available and Patient being monitored Patient Re-evaluated:Patient Re-evaluated prior to inductionOxygen Delivery Method: Circle system utilized Preoxygenation: Pre-oxygenation with 100% oxygen Intubation Type: IV induction Ventilation: Mask ventilation without difficulty Laryngoscope Size: Mac and 3 Grade View: Grade I Tube size: 7.5 mm Number of attempts: 1 Airway Equipment and Method: Stylet Placement Confirmation: ETT inserted through vocal cords under direct vision,  positive ETCO2 and breath sounds checked- equal and bilateral Secured at: 22 cm Tube secured with: Tape Dental Injury: Teeth and Oropharynx as per pre-operative assessment

## 2011-06-08 NOTE — Discharge Instructions (Signed)
   Metta Clines. Supple, M.D., F.A.A.O.S. Orthopaedic Surgery Specializing in Arthroscopic and Reconstructive Surgery of the Shoulder and Knee [phone] [address] [place], [place] [address] - Fax [phone]   POST-OP SHOULDER ARTHROSCOPY INSTRUCTIONS  1. Call the office at [phone] to schedule your first post-op appointment 7-10 days from the date of your surgery.  2. Leave the steri-strips in place over your incisions when performing dressing changes and showering. You may remove your dressings and begin showering 72 hours from surgery. You can expect drainage that is clear to bloody in nature that occasionally will soak through your dressings. If this occurs go ahead and perform a dressing change. The drainage should lessen daily and when there is no drainage from your incisions feel free to go without a dressing.  3. Wear your sling for comfort. You may come out of your sling for ad lib activity and even decide not to use the sling at all. If you find you are more comfortable in your sling, make sure you come out of your sling at least 3-4 times a day to do the exercises that are included below.  4. Range of motion to your elbow, wrist, and hand are encouraged 3-5 times daily. Exercise to your hand and fingers helps to reduce swelling you may experience.  5. Utilize ice to the shoulder 3-4 times minimum a day and additionally if you are experiencing pain.  6. You may drive when safely off narcotics and muscle relaxants.  7. If you had a block pre-operatively to provide post-op pain relief you may want to go ahead and begin utilizing your pain meds as your arm begins to wake up. Blocks can sometimes last up to 16-18 hours. If you are still pain-free prior to going to bed you may want to strongly consider taking a pain medication to avoid being awakened in the night with the onset of pain. A muscle relaxant is also provided for you should you experience muscle spasms. It  is recommended that if you are experiencing pain that your pain medication alone is not controlling, add the muscle relaxant along with the pain medication which can give additional pain relief. The first one to two days is generally the most severe of your pain and then should gradually decrease. As your pain lessens it is recommended that you decrease your use of the pain medications to an "as needed basis" only and to always comply with the recommended dosages of the pain medications.  8. Pain medications can produce constipation along with their use. If you experience this, the use of an over the counter stool softener or laxative daily is recommended.   9. For additional questions or concerns, please do not hesitate to call the office. If after hours there is an answering service to forward your concerns to the physician on call.   POST-OP EXERCISES  The pendulum exercises should be performed while bending at the waist as far over as possible thereby letting gravity do the work for you.  Range of Motion Exercises: Pendulum (circular)  Repeat 20 times. Do 3 sessions per day.     Range of Motion Exercises: Pendulum (side-to-side)  Repeat 20 times. Do 3 sessions per day.    Range of Motion Exercises (self-stretching activities):  Slide arm up wall with palm toward you, moving closer to the wall. Hold for 5 seconds.  Repeat 10 times. Do 3 sessions per day.

## 2011-06-08 NOTE — Transfer of Care (Signed)
Immediate Anesthesia Transfer of Care Note  Patient: Samantha Hahn  Procedure(s) Performed: Procedure(s) (LRB): ARTHROSCOPY SHOULDER (Left)  Patient Location: PACU  Anesthesia Type: General  Level of Consciousness: awake  Airway & Oxygen Therapy: Patient Spontanous Breathing and Patient connected to nasal cannula oxygen  Post-op Assessment: Report given to PACU RN and Post -op Vital signs reviewed and stable  Post vital signs: Reviewed and stable  Complications: No apparent anesthesia complications

## 2011-06-08 NOTE — Anesthesia Preprocedure Evaluation (Signed)
Anesthesia Evaluation  Patient identified by MRN, date of birth, ID band Patient awake    Reviewed: Allergy & Precautions, H&P , NPO status , Patient's Chart, lab work & pertinent test results, reviewed documented beta blocker date and time   Airway Mallampati: I TM Distance: >3 FB Neck ROM: Full    Dental  (+) Teeth Intact and Dental Advisory Given   Pulmonary  breath sounds clear to auscultation        Cardiovascular Rhythm:Regular Rate:Normal     Neuro/Psych    GI/Hepatic   Endo/Other  Morbid obesity  Renal/GU      Musculoskeletal   Abdominal   Peds  Hematology   Anesthesia Other Findings   Reproductive/Obstetrics                           Anesthesia Physical Anesthesia Plan  ASA: III  Anesthesia Plan: General   Post-op Pain Management:    Induction: Intravenous  Airway Management Planned: Oral ETT  Additional Equipment:   Intra-op Plan:   Post-operative Plan: Extubation in OR  Informed Consent: I have reviewed the patients History and Physical, chart, labs and discussed the procedure including the risks, benefits and alternatives for the proposed anesthesia with the patient or authorized representative who has indicated his/her understanding and acceptance.   Dental advisory given  Plan Discussed with: CRNA, Anesthesiologist and Surgeon  Anesthesia Plan Comments:         Anesthesia Quick Evaluation

## 2011-06-08 NOTE — H&P (Signed)
Samantha Hahn    Chief Complaint: LEFT SHOULDER ADHESIVE CAPSULITIS HPI: The patient is a 51 y.o. female with left shoulder adhesive capsulitis refractory to prolonged conservative mangement  Past Medical History  Diagnosis Date  . Asthma with COPD with status asthmaticus   . Allergy   . Acne rosacea   . GERD (gastroesophageal reflux disease)   . Diabetes mellitus   . Migraines   . Hot flashes   . Anemia   . Abnormal vaginal Pap smear   . Anxiety   . Dysrhythmia     pvc  . Depression     resolved  . Hypertension      resolved,     dr Percival Spanish    Past Surgical History  Procedure Date  . Abdominal hysterectomy     Family History  Problem Relation Age of Onset  . Heart disease Mother 62    CAD  . Asthma Mother   . Asthma Sister     Social History:  reports that she has never smoked. She does not have any smokeless tobacco history on file. She reports that she drinks alcohol. She reports that she does not use illicit drugs.  Allergies:  Allergies  Allergen Reactions  . Biaxin Hives  . Vicodin (Hydrocodone-Acetaminophen) Nausea And Vomiting  . Byetta Other (See Comments)    Rash & itching at injection site    Medications Prior to Admission  Medication Dose Route Frequency Provider Last Rate Last Dose  . ceFAZolin (ANCEF) IVPB 2 g/50 mL premix  2 g Intravenous 60 min Pre-Op Jenetta Loges, PA      . chlorhexidine (HIBICLENS) 4 % liquid 4 application  60 mL Topical Once [employer], PA      . fentaNYL (SUBLIMAZE) injection 50-100 mcg  50-100 mcg Intravenous PRN Napoleon Form, MD   100 mcg at 06/08/11 1215  . lactated ringers infusion   Intravenous Continuous Jenetta Loges, PA 20 mL/hr at 06/08/11 1212    . midazolam (VERSED) injection 1-2 mg  1-2 mg Intravenous PRN Napoleon Form, MD   2 mg at 06/08/11 1214   Medications Prior to Admission  Medication Sig Dispense Refill  . glipiZIDE (GLUCOTROL) 10 MG tablet Take 10 mg by mouth daily.       Marland Kitchen loratadine (CLARITIN)  10 MG tablet Take 10 mg by mouth daily after breakfast. For allergies      . metFORMIN (GLUCOPHAGE) 1000 MG tablet Take 1,000 mg by mouth 2 (two) times daily with a meal.        . METOPROLOL SUCCINATE PO Take 50 mg by mouth daily.       . multivitamin (THERAGRAN) per tablet Take 1 tablet by mouth daily.        . sitaGLIPtin (JANUVIA) 100 MG tablet Take 100 mg by mouth daily.        . SUMAtriptan (IMITREX) 100 MG tablet Take 100 mg by mouth every 2 (two) hours as needed.        . SYMBICORT 160-4.5 MCG/ACT inhaler INHALE 2 PUFFS BY MOUTH TWICE DAILY  3 Inhaler  PRN     Physical Exam: Left shoulder with severly rectrricted ROM and firm, painful endpoints.  Vitals  Temp:  [97.7 F (36.5 C)] 97.7 F (36.5 C) (04/18 1037) Pulse Rate:  [77] 77  (04/18 1037) Resp:  [18] 18  (04/18 1037) BP: (127)/(90) 127/90 mmHg (04/18 1037) SpO2:  [98 %] 98 % (04/18 1037)  Assessment/Plan  Impression: LEFT SHOULDER  ADHESIVE CAPSULITIS  Plan of Action: Procedure(s): ARTHROSCOPY SHOULDER, mua, loa  Ulyana Pitones M 06/08/2011, 12:38 PM

## 2011-06-08 NOTE — Anesthesia Postprocedure Evaluation (Signed)
Anesthesia Post Note  Patient: Samantha Hahn  Procedure(s) Performed: Procedure(s) (LRB): ARTHROSCOPY SHOULDER (Left)  Anesthesia type: General  Patient location: PACU  Post pain: Pain level controlled and Adequate analgesia  Post assessment: Post-op Vital signs reviewed, Patient's Cardiovascular Status Stable, Respiratory Function Stable, Patent Airway and Pain level controlled  Last Vitals:  Filed Vitals:   06/08/11 1615  BP: 123/54  Pulse: 61  Temp:   Resp: 19    Post vital signs: Reviewed and stable  Level of consciousness: awake, alert  and oriented  Complications: No apparent anesthesia complications

## 2011-06-09 ENCOUNTER — Encounter (HOSPITAL_COMMUNITY): Payer: Self-pay | Admitting: Orthopedic Surgery

## 2011-06-09 NOTE — Op Note (Signed)
NAMESHAILEY, UHL NO.:  [id number]  MEDICAL RECORD NO.:  [id number]  LOCATION:  [facility]                         FACILITY:  [facility]  PHYSICIAN:  Metta Clines. Christino Mcglinchey, M.D.  DATE OF BIRTH:  Aug 18, 1960  DATE OF PROCEDURE:  06/08/2011 DATE OF DISCHARGE:  06/08/2011                              OPERATIVE REPORT   PREOPERATIVE DIAGNOSIS:  Left shoulder adhesive capsulitis.  POSTOPERATIVE DIAGNOSES: 1. Left shoulder adhesive capsulitis. 2. Left shoulder impingement syndrome. 3. Left shoulder acromioclavicular joint arthropathy.  PROCEDURE: 1. Left shoulder examination under anesthesia. 2. Left shoulder manipulation under anesthesia. 3. Left shoulder glenohumeral joint diagnostic arthroscopy. 4. Capsular release and synovectomy of the glenohumeral joint. 5. Arthroscopic subacromial decompression and bursectomy. 6. Arthroscopic distal clavicle resection.  SURGEON:  Metta Clines. Deven Furia, M.D.  Terrence DupontOlivia Mackie A. Shuford, PA-C.  ANESTHESIA:  General endotracheal as well as an interscalene block.  ESTIMATED BLOOD LOSS:  Minimal.  DRAINS:  None.  HISTORY:  Samantha Hahn is a 51 year old female who has had chronic progressive increasing left shoulder pain with restrictions in mobility and severe adhesive capsulitis refractory to prolonged and multiple attempts of conservative management.  She is brought to the operating room at this time for planned left shoulder arthroscopy with manipulation and lysis of adhesions.  Preoperatively, I counseled Samantha Hahn on treatment options as well as risks versus benefits thereof.  Possible surgical complications were reviewed including the potential for bleeding, infection, neurovascular injury, persistent pain, loss of motion, anesthetic complication, periarticular fracture and/or dislocation, possible need for additional surgery.  She understands and accepts and agrees with our planned procedure.  PROCEDURE IN DETAIL:  After  undergoing routine preop evaluation, the patient received prophylactic antibiotics, and an interscalene block was established in holding area by the Anesthesia Department.  Placed supine on the op table, underwent smooth induction of a general endotracheal anesthesia.  Turned to the right lateral decubitus position on a beanbag and appropriately padded and protected.  The left shoulder examination under anesthesia revealed forward elevation to approximately 120 degrees with a firm endpoint.  Manipulation was performed with palpable and audible release of adhesions.  170 degrees forward elevation was achieved as was 90 degrees of internal and external rotation.  Left arm was then suspended at 70 degrees of abduction with 10 pounds of traction.  Left shoulder girdle region was sterilely prepped and draped in standard fashion.  Time-out was called.  Posterior portal was established in glenohumeral joint and anterior portal was established under direct visualization.  Large hemarthrosis was evacuated.  Noted to be diffuse synovitis throughout the glenohumeral joint and a shaver was introduced and used to perform synovectomy and then we performed hemostasis using the Stryker wand.  I also used Stryker wand to perform an anterior capsular release from 7 o'clock up to 11 o'clock.  The articular surfaces were in good condition.  The biceps anchor was stable.  Biceps tendon was intact with no instability proximally and distally.  In addition, the rotator cuff was carefully inspected and found to be intact.  At this point, we completed the synovectomy and obtained hemostasis throughout the glenohumeral joint.  Fluid and instrument were  then removed.  The arm dropped down to 30 degrees abduction with arthroscope introduced in the subacromial space through the posterior portal and a direct lateral port was established in the subacromial space.  Abundant dense bursal tissue and multiple adhesions were  encountered.  These were all divided and excised with the combination of shaver and a Stryker wand.  The wand was then used to remove the periosteum from the undersurface of the anterior half of the acromion.  The subacromial decompression was performed with a bur creating type 1 morphology.  Portal was then established directly anterior to the distal clavicle and distal clavicle resection was performed with a bur.  Care was taken to confirm visualization of the entire circumference of distal clavicle to ensure adequate removal of bone.  We then completed the subacromial/subdeltoid bursectomy and obtained meticulous hemostasis and divided all the adhesions.  At this point, fluid and instruments were removed.  The portals were closed with Monocryl and Steri-Strips.  A bulky dry dressing taped to the left shoulder.  Jenetta Loges, PA-C, was utilized as Environmental consultant throughout this case, to assist with positioning of the extremity, management of the arthroscopic equipment, wound closure, intraoperative decision making, and facilitation of procedure to be done in a timely and expeditious manner.  A dry dressing was taped at the left shoulder.  Left arm was placed in a sling.  The patient was awakened, extubated, and taken to the recovery room in stable condition.     Metta Clines. Mohab Ashby, M.D.     KMS/MEDQ  D:  06/08/2011  T:  06/08/2011  Job:  [id number]

## 2011-06-09 NOTE — Anesthesia Postprocedure Evaluation (Signed)
  Anesthesia Post-op Note  Patient: Samantha Hahn  Procedure(s) Performed: Procedure(s) (LRB): ARTHROSCOPY SHOULDER (Left)  Patient Location: PACU  Anesthesia Type: GA combined with regional for post-op pain  Level of Consciousness: awake, alert  and oriented  Airway and Oxygen Therapy: Patient Spontanous Breathing  Post-op Pain: mild  Post-op Assessment: Post-op Vital signs reviewed  Post-op Vital Signs: Reviewed  Complications: No apparent anesthesia complications

## 2011-06-12 ENCOUNTER — Ambulatory Visit: Payer: PRIVATE HEALTH INSURANCE | Attending: Family Medicine | Admitting: Physical Therapy

## 2011-06-12 DIAGNOSIS — IMO0001 Reserved for inherently not codable concepts without codable children: Secondary | ICD-10-CM | POA: Insufficient documentation

## 2011-06-12 DIAGNOSIS — M25519 Pain in unspecified shoulder: Secondary | ICD-10-CM | POA: Insufficient documentation

## 2011-06-12 DIAGNOSIS — M25619 Stiffness of unspecified shoulder, not elsewhere classified: Secondary | ICD-10-CM | POA: Insufficient documentation

## 2011-06-14 ENCOUNTER — Ambulatory Visit: Payer: PRIVATE HEALTH INSURANCE

## 2011-06-16 ENCOUNTER — Ambulatory Visit: Payer: PRIVATE HEALTH INSURANCE

## 2011-06-20 ENCOUNTER — Ambulatory Visit: Payer: PRIVATE HEALTH INSURANCE | Admitting: Physical Therapy

## 2011-06-22 ENCOUNTER — Ambulatory Visit: Payer: PRIVATE HEALTH INSURANCE | Attending: Family Medicine | Admitting: Physical Therapy

## 2011-06-22 DIAGNOSIS — IMO0001 Reserved for inherently not codable concepts without codable children: Secondary | ICD-10-CM | POA: Insufficient documentation

## 2011-06-22 DIAGNOSIS — M25619 Stiffness of unspecified shoulder, not elsewhere classified: Secondary | ICD-10-CM | POA: Insufficient documentation

## 2011-06-22 DIAGNOSIS — M25519 Pain in unspecified shoulder: Secondary | ICD-10-CM | POA: Insufficient documentation

## 2011-06-23 ENCOUNTER — Ambulatory Visit: Payer: PRIVATE HEALTH INSURANCE | Admitting: Physical Therapy

## 2011-06-26 ENCOUNTER — Ambulatory Visit: Payer: PRIVATE HEALTH INSURANCE | Admitting: Physical Therapy

## 2011-06-28 ENCOUNTER — Ambulatory Visit: Payer: PRIVATE HEALTH INSURANCE | Admitting: Physical Therapy

## 2011-07-05 ENCOUNTER — Ambulatory Visit: Payer: PRIVATE HEALTH INSURANCE | Admitting: Physical Therapy

## 2011-07-07 ENCOUNTER — Ambulatory Visit: Payer: PRIVATE HEALTH INSURANCE | Admitting: Physical Therapy

## 2011-07-10 ENCOUNTER — Ambulatory Visit: Payer: PRIVATE HEALTH INSURANCE | Admitting: Physical Therapy

## 2011-07-11 ENCOUNTER — Other Ambulatory Visit: Payer: Self-pay | Admitting: Internal Medicine

## 2011-07-12 ENCOUNTER — Ambulatory Visit: Payer: PRIVATE HEALTH INSURANCE | Admitting: Physical Therapy

## 2011-07-21 ENCOUNTER — Ambulatory Visit: Payer: PRIVATE HEALTH INSURANCE | Admitting: Physical Therapy

## 2011-07-25 ENCOUNTER — Other Ambulatory Visit: Payer: Self-pay

## 2011-07-25 ENCOUNTER — Other Ambulatory Visit: Payer: Self-pay | Admitting: Internal Medicine

## 2011-07-25 MED ORDER — GNP LANCETS MISC: Status: DC

## 2011-07-26 ENCOUNTER — Other Ambulatory Visit: Payer: Self-pay | Admitting: *Deleted

## 2011-07-26 ENCOUNTER — Ambulatory Visit: Payer: PRIVATE HEALTH INSURANCE | Attending: Family Medicine | Admitting: Physical Therapy

## 2011-07-26 ENCOUNTER — Telehealth: Payer: Self-pay | Admitting: Cardiology

## 2011-07-26 DIAGNOSIS — M25619 Stiffness of unspecified shoulder, not elsewhere classified: Secondary | ICD-10-CM | POA: Insufficient documentation

## 2011-07-26 DIAGNOSIS — M25519 Pain in unspecified shoulder: Secondary | ICD-10-CM | POA: Insufficient documentation

## 2011-07-26 DIAGNOSIS — R0683 Snoring: Secondary | ICD-10-CM

## 2011-07-26 DIAGNOSIS — IMO0001 Reserved for inherently not codable concepts without codable children: Secondary | ICD-10-CM | POA: Insufficient documentation

## 2011-07-26 DIAGNOSIS — R5383 Other fatigue: Secondary | ICD-10-CM

## 2011-07-26 NOTE — Telephone Encounter (Signed)
New msg Pt wants to talk to you about when she needs to do sleep study again. She didn't go to sleep for 1st study. Please call

## 2011-07-26 NOTE — Telephone Encounter (Signed)
Pt is aware she is to repeat the sleep study.  Order has been placed and pt will be called with an appt.

## 2011-08-02 ENCOUNTER — Ambulatory Visit: Payer: PRIVATE HEALTH INSURANCE | Admitting: Physical Therapy

## 2011-08-08 ENCOUNTER — Ambulatory Visit: Payer: PRIVATE HEALTH INSURANCE | Admitting: Physical Therapy

## 2011-08-18 ENCOUNTER — Encounter: Payer: Self-pay | Admitting: Internal Medicine

## 2011-08-18 ENCOUNTER — Ambulatory Visit (INDEPENDENT_AMBULATORY_CARE_PROVIDER_SITE_OTHER): Payer: 59 | Admitting: Internal Medicine

## 2011-08-18 VITALS — BP 130/66 | HR 72 | Temp 99.6°F | Ht 67.5 in | Wt 231.0 lb

## 2011-08-18 DIAGNOSIS — J040 Acute laryngitis: Secondary | ICD-10-CM

## 2011-08-18 DIAGNOSIS — Z862 Personal history of diseases of the blood and blood-forming organs and certain disorders involving the immune mechanism: Secondary | ICD-10-CM

## 2011-08-18 DIAGNOSIS — J45909 Unspecified asthma, uncomplicated: Secondary | ICD-10-CM

## 2011-08-18 DIAGNOSIS — Z8639 Personal history of other endocrine, nutritional and metabolic disease: Secondary | ICD-10-CM

## 2011-08-18 NOTE — Patient Instructions (Addendum)
Take Levaquin 500 milligrams daily for 6 days. Take Sterapred DS 10 mg 6 day dosepak in tapering course. Take Tessalon Perles as needed for cough. Call if not better in one week or sooner if worse.

## 2011-08-18 NOTE — Progress Notes (Signed)
  Subjective:    Patient ID: Samantha Hahn, female    DOB: Oct 06, 1960, 51 y.o.   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 Has had chest congestion with discolored sputum production. Has a history of diabetes mellitus and asthma. Has been using Ventolin inhaler without much success. Has developed laryngitis as well. No fever or shaking chills. Says diabetic control has been recently. Cough is tolerable. Also complaining of he head congestion. No significant sore throat.    Review of Systems     Objective:   Physical Exam HEENT exam: TMs are full bilaterally but not red. Pharynx is clear without exudate. Neck is supple without thyromegaly carotid bruits or JVD; chest bilateral rhonchi and wheezing on inspiration. Her voice is quite hoarse and she speaks in a whisper. Extremities without edema. Neuro: She is alert and oriented and gives a good history        Assessment & Plan:  Asthmatic bronchitis  Diabetes mellitus  Plan: Sterapred DS 10 mg 6 day dosepak. Tessalon Perles 100 mg (#60) 2 by mouth 3 times a day when necessary cough. Levaquin 500 milligrams daily for 7 days. Ventolin inhaler 2 sprays by mouth 4 times a day as needed.

## 2011-08-20 ENCOUNTER — Ambulatory Visit (HOSPITAL_BASED_OUTPATIENT_CLINIC_OR_DEPARTMENT_OTHER): Payer: PRIVATE HEALTH INSURANCE

## 2011-08-27 ENCOUNTER — Encounter (HOSPITAL_BASED_OUTPATIENT_CLINIC_OR_DEPARTMENT_OTHER): Payer: Self-pay

## 2011-09-05 ENCOUNTER — Other Ambulatory Visit: Payer: 59 | Admitting: Internal Medicine

## 2011-09-05 DIAGNOSIS — E785 Hyperlipidemia, unspecified: Secondary | ICD-10-CM

## 2011-09-05 DIAGNOSIS — Z79899 Other long term (current) drug therapy: Secondary | ICD-10-CM

## 2011-09-05 DIAGNOSIS — E119 Type 2 diabetes mellitus without complications: Secondary | ICD-10-CM

## 2011-09-05 LAB — LIPID PANEL
Cholesterol: 139 mg/dL (ref 0–200)
Total CHOL/HDL Ratio: 3.2 Ratio

## 2011-09-05 LAB — HEPATIC FUNCTION PANEL
ALT: 17 U/L (ref 0–35)
AST: 16 U/L (ref 0–37)
Alkaline Phosphatase: 64 U/L (ref 39–117)
Bilirubin, Direct: 0.1 mg/dL (ref 0.0–0.3)

## 2011-09-05 LAB — HEMOGLOBIN A1C
Hgb A1c MFr Bld: 7 % — ABNORMAL HIGH (ref <5.7)
Mean Plasma Glucose: 154 mg/dL — ABNORMAL HIGH (ref <117)

## 2011-09-07 ENCOUNTER — Ambulatory Visit (INDEPENDENT_AMBULATORY_CARE_PROVIDER_SITE_OTHER): Payer: 59 | Admitting: Internal Medicine

## 2011-09-07 ENCOUNTER — Ambulatory Visit: Payer: Self-pay | Admitting: Cardiology

## 2011-09-07 ENCOUNTER — Encounter: Payer: Self-pay | Admitting: Internal Medicine

## 2011-09-07 VITALS — BP 124/76 | HR 80 | Temp 98.4°F | Ht 67.75 in | Wt 233.5 lb

## 2011-09-07 DIAGNOSIS — I10 Essential (primary) hypertension: Secondary | ICD-10-CM

## 2011-09-07 DIAGNOSIS — E119 Type 2 diabetes mellitus without complications: Secondary | ICD-10-CM

## 2011-09-07 DIAGNOSIS — J45909 Unspecified asthma, uncomplicated: Secondary | ICD-10-CM

## 2011-09-07 DIAGNOSIS — Z8659 Personal history of other mental and behavioral disorders: Secondary | ICD-10-CM

## 2011-09-07 DIAGNOSIS — Z8709 Personal history of other diseases of the respiratory system: Secondary | ICD-10-CM

## 2011-09-07 NOTE — Progress Notes (Signed)
  Subjective:    Patient ID: Samantha Hahn, female    DOB: 08-21-1960, 51 y.o.   MRN: [id number]  HPI In today for six-month followup on diabetes and hypertension. Hemoglobin A1c is slightly higher at 7% and previously measured a few months ago. Has been on steroids recently for bronchospasm. Recently saw allergist. Her daughter has moved back from [place] and has a dog. She has moved in with her. Patient tested negative for dog allergy.  Allergist changed her from Symbicort to Qvar inhaler and also gave her a Qvar nasal spray to use 1 spray in each nostril daily. Says allergist thought this might be less likely to cause palpitations and Symbicort. Feels well today. Continues to work weekend option. It seems to be good for her mentally. However has taken a pay cut.    Review of Systems     Objective:   Physical Exam neck is supple without thyromegaly; chest clear to auscultation; cardiac exam regular rate and rhythm normal S1 and S2 without murmur; extremities without edema. Alert and oriented x3. Diabetic foot exam: No ulcers. Pulses are 1+ bilaterally.        Assessment & Plan:  Hypertension  Diabetes  History of bronchospasm  Asthma  History of depression/bipolar disorder  Plan: Return in 6 months for physical examination.

## 2011-09-07 NOTE — Patient Instructions (Addendum)
Keep up the good work and return in 6 months for PE

## 2011-09-17 ENCOUNTER — Ambulatory Visit (HOSPITAL_BASED_OUTPATIENT_CLINIC_OR_DEPARTMENT_OTHER): Payer: 59 | Attending: Cardiology | Admitting: Radiology

## 2011-09-17 VITALS — Ht 67.0 in | Wt 230.0 lb

## 2011-09-17 DIAGNOSIS — R5383 Other fatigue: Secondary | ICD-10-CM

## 2011-09-17 DIAGNOSIS — R0683 Snoring: Secondary | ICD-10-CM

## 2011-09-17 DIAGNOSIS — G4733 Obstructive sleep apnea (adult) (pediatric): Secondary | ICD-10-CM | POA: Insufficient documentation

## 2011-09-23 DIAGNOSIS — I4949 Other premature depolarization: Secondary | ICD-10-CM

## 2011-09-23 DIAGNOSIS — G4733 Obstructive sleep apnea (adult) (pediatric): Secondary | ICD-10-CM

## 2011-09-23 NOTE — Procedures (Signed)
NAME:  Samantha Hahn, Samantha Hahn NO.:  [id number]  MEDICAL RECORD NO.:  [id number]          PATIENT TYPE:  OUT  LOCATION:  SLEEP CENTER                 FACILITY:  [facility]  PHYSICIAN:  Kathee Delton, MD,FCCPDATE OF BIRTH:  19-Nov-1959  DATE OF STUDY:                           NOCTURNAL POLYSOMNOGRAM  REFERRING PHYSICIAN:  Minus Breeding, MD, [facility]  INDICATION FOR STUDY:  Hypersomnia with sleep apnea.  EPWORTH SLEEPINESS SCORE:  8.  MEDICATIONS:  SLEEP ARCHITECTURE:  The patient had a total sleep time of 282 minutes with only 66 minutes of slow-wave sleep and 15 minutes of REM.  Sleep onset latency was mildly prolonged at 35 minutes, and REM onset was very prolonged at 357 minutes.  Sleep efficiency was poor at 69%.  RESPIRATORY DATA:  The patient was found to have 10 apneas and 31 obstructive hypopneas, giving her an apnea-hypopnea index of 9 events per hour.  The events occurred in all body positions, and there was mild snoring noted throughout.  The patient did not meet the split night protocol secondary to her small numbers of events.  OXYGEN DATA:  There was O2 desaturation as low as 81% with the patient's obstructive events.  CARDIAC DATA:  Occasional PVC noted, but no clinically significant arrhythmias were noted.  MOVEMENTS/PARASOMNIA:  The patient had no significant leg jerks or other abnormal behavior seen.  IMPRESSION/RECOMMENDATION: 1. Mild obstructive sleep apnea/hypopnea syndrome with an AHI of 9     events per hour, and O2 desaturation transiently as low as 81%.     Treatment for this degree of sleep apnea can include a trial of     weight loss alone, upper airway surgery, dental appliance, and also     CPAP.  Clinical correlation is suggested. 2. Occasional PVCs noted, but no clinically significant arrhythmias     were seen.     Kathee Delton, MD,FCCP Diplomate, [place] Board of Sleep Medicine   KMC/MEDQ  D:  09/23/2011 15:32:21  T:   09/23/2011 20:36:51  Job:  [id number]

## 2011-10-04 ENCOUNTER — Telehealth: Payer: Self-pay

## 2011-10-04 NOTE — Telephone Encounter (Signed)
Message copied by Audria Nine on Wed Oct 04, 2011  2:14 PM ------      Message from: Minus Breeding      Created: Wed Sep 27, 2011  5:11 PM       Only mild OSA.  No further treatment needed.        ----- Message -----         From: Kathee Delton, MD         Sent: 09/25/2011   8:49 AM           To: Minus Breeding, MD            Maylon Cos, wanted to let you know this pt;s sleep study shows mild osa with AHI 9/hr.  Would treat aggressively only if she is symptomatic, otherwise weight loss.

## 2011-10-04 NOTE — Telephone Encounter (Signed)
Patient aware of sleep study report and she said that she would talk to Dr Percival Spanish at her appointment on 10/05/11

## 2011-10-05 ENCOUNTER — Ambulatory Visit (INDEPENDENT_AMBULATORY_CARE_PROVIDER_SITE_OTHER): Payer: 59 | Admitting: Cardiology

## 2011-10-05 ENCOUNTER — Encounter: Payer: Self-pay | Admitting: Cardiology

## 2011-10-05 VITALS — BP 120/72 | HR 78 | Ht 67.5 in | Wt 233.0 lb

## 2011-10-05 DIAGNOSIS — G4730 Sleep apnea, unspecified: Secondary | ICD-10-CM

## 2011-10-05 DIAGNOSIS — I10 Essential (primary) hypertension: Secondary | ICD-10-CM

## 2011-10-05 DIAGNOSIS — I4949 Other premature depolarization: Secondary | ICD-10-CM

## 2011-10-05 DIAGNOSIS — I493 Ventricular premature depolarization: Secondary | ICD-10-CM

## 2011-10-05 NOTE — Progress Notes (Signed)
HPI The patient presents for evaluation of shortness of breath.  I was impressed by her symptoms. I sent her for a pro BNP level which was slightly high. Echocardiogram demonstrated mild LVH. Stress perfusion imaging was negative for any evidence of ischemia. I had wanted to do a CT coronary angiogram she had ventricular ectopy which precluded this. Since I last saw her she does think the dyspnea is slightly better. She's continuing to have swelling in the left greater than right leg. She's also continuing to have decreased sleep. Finally she has PVCs but these are not particularly symptomatic. Of note I reviewed both the sleep studies she had one in April and one this month.  There was some mild sleep apnea and treatment with left to several possibilities.  Allergies  Allergen Reactions  . Clarithromycin Hives  . Vicodin (Hydrocodone-Acetaminophen) Nausea And Vomiting  . Exenatide Other (See Comments)    Rash & itching at injection site    Current Outpatient Prescriptions  Medication Sig Dispense Refill  . albuterol (PROVENTIL HFA;VENTOLIN HFA) 108 (90 BASE) MCG/ACT inhaler Inhale 2 puffs into the lungs every 6 (six) hours as needed. For shortness of breath      . aspirin 325 MG tablet Take 325 mg by mouth daily.      Marland Kitchen atorvastatin (LIPITOR) 20 MG tablet Take 1 tablet (20 mg total) by mouth daily.  90 tablet  1  . beclomethasone (QVAR) 80 MCG/ACT inhaler Inhale 2 puffs into the lungs 2 (two) times daily.      . Beclomethasone Dipropionate POWD Place into both nostrils.      . Calcium-Vitamin D 600-200 MG-UNIT per tablet Take 1 tablet by mouth daily.      Marland Kitchen estrogens, conjugated, (PREMARIN) 0.3 MG tablet Take 0.3 mg by mouth daily. Take daily for 21 days then do not take for 7 days.      . ferrous fumarate (HEMOCYTE - 106 MG FE) 325 (106 FE) MG TABS Take 1 tablet by mouth daily.      Marland Kitchen glipiZIDE (GLUCOTROL) 10 MG tablet Take 10 mg by mouth daily.       . GNP Lancets MISC Test bid  200  each  3  . JANUVIA 100 MG tablet TAKE 1 TABLET BY MOUTH DAILY  90 tablet  PRN  . loratadine (CLARITIN) 10 MG tablet Take 10 mg by mouth daily after breakfast. For allergies      . metFORMIN (GLUCOPHAGE) 1000 MG tablet TAKE 1 TABLET BY MOUTH TWICE DAILY  180 tablet  PRN  . METOPROLOL SUCCINATE PO Take 50 mg by mouth daily.       . multivitamin (THERAGRAN) per tablet Take 1 tablet by mouth daily.        . pioglitazone (ACTOS) 45 MG tablet Take 45 mg by mouth daily.      . SUMAtriptan (IMITREX) 100 MG tablet Take 100 mg by mouth every 2 (two) hours as needed.        . TRUETEST TEST test strip TEST TWICE DAILY AS NEEDED  200 each  PRN    Past Medical History  Diagnosis Date  . Asthma with COPD with status asthmaticus   . Allergy   . Acne rosacea   . GERD (gastroesophageal reflux disease)   . Diabetes mellitus   . Migraines   . Hot flashes   . Anemia   . Abnormal vaginal Pap smear   . Anxiety   . Dysrhythmia     pvc  .  Depression     resolved  . Hypertension      resolved,     dr Percival Spanish    Past Surgical History  Procedure Date  . Abdominal hysterectomy   . Shoulder arthroscopy 06/08/2011    Procedure: ARTHROSCOPY SHOULDER;  Surgeon: Marin Shutter, MD;  Location: [facility];  Service: Orthopedics;  Laterality: Left;  MUA, LOA, SAD, DCR    ROS:  As stated in the HPI and negative for all other systems.  PHYSICAL EXAM BP 120/72  Pulse 78  Ht 5' 7.5" (1.715 m)  Wt 233 lb (105.688 kg)  BMI 35.95 kg/m2 GENERAL:  Well appearing HEENT:  Pupils equal round and reactive, fundi not visualized, oral mucosa unremarkable NECK:  No jugular venous distention, waveform within normal limits, carotid upstroke brisk and symmetric, no bruits, no thyromegaly LYMPHATICS:  No cervical, inguinal adenopathy LUNGS:  Clear to auscultation bilaterally BACK:  No CVA tenderness CHEST:  Unremarkable HEART:  PMI not displaced or sustained,S1 and S2 within normal limits, no S3, no S4, no clicks, no rubs,  no murmurs ABD:  Flat, positive bowel sounds normal in frequency in pitch, no bruits, no rebound, no guarding, no midline pulsatile mass, no hepatomegaly, no splenomegaly EXT:  2 plus pulses throughout, mild edema, no cyanosis no clubbing  EKG:  Sinus rhythm, rate 83, premature ventricular contractions. No acute ST-T wave changes.  10/05/2011  ASSESSMENT AND PLAN  Dyspnea -  There may be some element of diastolic dysfunction. She will continue with conservative therapies. No further workup for this is suggested.  Fatigue -  There is some suggestion of sleep apnea and I will refer her to see Dr. Gwenette Greet.  PVC (premature ventricular contraction) -  She has otherwise had a negative workup. No further testing or therapy is suggested.  Overweight -  She is working out at [employer] and will continue with diet and exercise for weight loss.

## 2011-10-05 NOTE — Patient Instructions (Addendum)
You have been referred to Dr Gwenette Greet  Your physician wants you to follow-up in: 1 year.   You will receive a reminder letter in the mail two months in advance. If you don't receive a letter, please call our office to schedule the follow-up appointment.

## 2011-10-06 ENCOUNTER — Telehealth: Payer: Self-pay | Admitting: Cardiology

## 2011-10-06 NOTE — Telephone Encounter (Signed)
New msg Pt was here yesterday and was told to call back to get referral to Dr Gwenette Greet

## 2011-10-06 NOTE — Telephone Encounter (Signed)
I spoke with Ivin Booty in [facility]. They cannot see the referral in the work que. She asked that I send her a staff message and she will contact the patient about scheduling her appointment. Message sent.

## 2011-10-19 ENCOUNTER — Ambulatory Visit (AMBULATORY_SURGERY_CENTER): Payer: 59 | Admitting: *Deleted

## 2011-10-19 VITALS — Ht 67.0 in | Wt 233.2 lb

## 2011-10-19 DIAGNOSIS — Z1211 Encounter for screening for malignant neoplasm of colon: Secondary | ICD-10-CM

## 2011-10-19 MED ORDER — MOVIPREP 100 G PO SOLR: 1.0000 | Freq: Once | ORAL | Status: DC

## 2011-10-31 ENCOUNTER — Encounter: Payer: Self-pay | Admitting: Pulmonary Disease

## 2011-10-31 ENCOUNTER — Ambulatory Visit (INDEPENDENT_AMBULATORY_CARE_PROVIDER_SITE_OTHER): Payer: 59 | Admitting: Pulmonary Disease

## 2011-10-31 VITALS — BP 122/80 | HR 76 | Temp 97.5°F | Ht 67.0 in | Wt 233.0 lb

## 2011-10-31 DIAGNOSIS — G4733 Obstructive sleep apnea (adult) (pediatric): Secondary | ICD-10-CM | POA: Insufficient documentation

## 2011-10-31 NOTE — Assessment & Plan Note (Signed)
The patient has mild obstructive sleep apnea, and therefore it is not a significant risk for cardiovascular health.  However, she is very symptomatic during the night, and has definite inappropriate daytime sleepiness.  I have outlined a conservative approach with a trial of weight loss alone, but she is concerned about success given her level of fatigue and sleepiness.  I have also discussed with her dental appliance and CPAP.  Her anatomy is really not amenable to surgery.  At this point, the patient would like a referral to dental medicine for consideration of an oral appliance.  I have also encouraged her to work aggressively on weight loss.

## 2011-10-31 NOTE — Patient Instructions (Addendum)
Will refer for dental evaluation for your mild sleep apnea. Work on weight loss If you are unable to get dental appliance, let me know and we can setup on cpap.

## 2011-10-31 NOTE — Progress Notes (Signed)
  Subjective:    Patient ID: Samantha Hahn, female    DOB: 05-20-1960, 51 y.o.   MRN: [id number]  HPI The patient is a 51 year old female who I've been asked to see for management of obstructive sleep apnea.  She has recently had a sleep study in July, and this showed mild obstructive sleep apnea with an AHI of 9 events per hour.  The patient is been noted to have loud snoring, as well as an abnormal breathing pattern during sleep.  She has frequent awakenings at night, and does not feel rested in the mornings upon arising.  She does note sleep pressured during the day with periods of inactivity, and will fall asleep easily watching movies or television in the evenings.  She denies any sleepiness issues with driving.  The patient states that her weight is up about 20 pounds over the last 2 years, and her Epworth score today is abnormal at 12.  Sleep Questionnaire: What time do you typically go to bed?( Between what hours) 11; 30P,M How long does it take you to fall asleep? UP TO AN HOUR How many times during the night do you wake up? What time do you get out of bed to start your day? Do you drive or operate heavy machinery in your occupation? No How much has your weight changed (up or down) over the past two years? (In pounds) 20 lb (9.072 kg) Have you ever had a sleep study before? Yes If yes, location of study? [facility] If yes, date of study? 09-17-2011 Do you currently use CPAP? No Do you wear oxygen at any time? No    Review of Systems  Constitutional: Negative for fever and unexpected weight change.  HENT: Negative for ear pain, nosebleeds, congestion, sore throat, rhinorrhea, sneezing, trouble swallowing, dental problem, postnasal drip and sinus pressure.   Eyes: Negative for redness and itching.  Respiratory: Positive for shortness of breath. Negative for cough, chest tightness and wheezing.   Cardiovascular: Positive for leg swelling. Negative for palpitations.  Gastrointestinal: Negative for nausea  and vomiting.  Genitourinary: Negative for dysuria.  Musculoskeletal: Negative for joint swelling.  Skin: Negative for rash.  Neurological: Positive for headaches.  Hematological: Bruises/bleeds easily.  Psychiatric/Behavioral: Negative for dysphoric mood. The patient is not nervous/anxious.        Objective:   Physical Exam Constitutional:  Overweight female, no acute distress  HENT:  Nares patent without discharge  Oropharynx without exudate, palate and uvula are mildly elongated.  Eyes:  Perrla, eomi, no scleral icterus  Neck:  No JVD, no TMG  Cardiovascular:  Normal rate, regular rhythm, no rubs or gallops.  No murmurs        Intact distal pulses  Pulmonary :  Normal breath sounds, no stridor or respiratory distress   No rales, rhonchi, or wheezing  Abdominal:  Soft, nondistended, bowel sounds present.  No tenderness noted.   Musculoskeletal:  mild lower extremity edema noted.  Lymph Nodes:  No cervical lymphadenopathy noted  Skin:  No cyanosis noted  Neurologic:  Mildly sleepy, appropriate, moves all 4 extremities without obvious deficit.         Assessment & Plan:

## 2011-11-02 ENCOUNTER — Encounter: Payer: Self-pay | Admitting: Internal Medicine

## 2011-11-09 ENCOUNTER — Other Ambulatory Visit: Payer: Self-pay | Admitting: Internal Medicine

## 2011-11-15 ENCOUNTER — Encounter: Payer: Self-pay | Admitting: Internal Medicine

## 2011-11-15 ENCOUNTER — Ambulatory Visit (AMBULATORY_SURGERY_CENTER): Payer: 59 | Admitting: Internal Medicine

## 2011-11-15 VITALS — BP 131/59 | HR 76 | Temp 98.4°F | Resp 16 | Ht 67.0 in | Wt 233.0 lb

## 2011-11-15 DIAGNOSIS — D126 Benign neoplasm of colon, unspecified: Secondary | ICD-10-CM

## 2011-11-15 DIAGNOSIS — Z1211 Encounter for screening for malignant neoplasm of colon: Secondary | ICD-10-CM

## 2011-11-15 LAB — GLUCOSE, CAPILLARY
Glucose-Capillary: 128 mg/dL — ABNORMAL HIGH (ref 70–99)
Glucose-Capillary: 109 mg/dL — ABNORMAL HIGH (ref 70–99)

## 2011-11-15 MED ORDER — SODIUM CHLORIDE 0.9 % IV SOLN: 500.0000 mL | INTRAVENOUS | Status: DC

## 2011-11-15 NOTE — Op Note (Signed)
[facility]  [address]. [place], [address]   COLONOSCOPY PROCEDURE REPORT  PATIENT: Samantha Hahn, Samantha Hahn  MR#: [id number] BIRTHDATE: 03-11-1960 , 65  yrs. old GENDER: Female ENDOSCOPIST: Eustace Quail, MD REFERRED [id number] Parke Simmers, M.D. PROCEDURE DATE:  11/15/2011 PROCEDURE:   Colonoscopy with snare polypectomy    x 2 ASA CLASS:   Class II INDICATIONS:average risk screening. MEDICATIONS: MAC sedation, administered by CRNA and propofol (Diprivan) 500mg  IV  DESCRIPTION OF PROCEDURE:   After the risks benefits and alternatives of the procedure were thoroughly explained, informed consent was obtained.  A digital rectal exam revealed no abnormalities of the rectum.   The LB CF-H180AL [id number]  endoscope was introduced through the anus and advanced to the cecum, which was identified by both the appendix and ileocecal valve. No adverse events experienced.   The quality of the prep was excellent, using MoviPrep  The instrument was then slowly withdrawn as the colon was fully examined.      COLON FINDINGS: Two diminutive polyps were found in the ascending colon and rectum.  A polypectomy was performed with a cold snare. The resection was complete and the polyp tissue was completely retrieved.   The colon was otherwise normal.  There was no diverticulosis, inflammation, polyps or cancers unless previously stated.  Retroflexed views revealed no abnormalities. The time to cecum=1 minutes 52 seconds.  Withdrawal time=18 minutes 50 seconds. The scope was withdrawn and the procedure completed. COMPLICATIONS: There were no complications.  ENDOSCOPIC IMPRESSION: 1.   Two diminutive polyps were found in the ascending colon and rectum; polypectomy was performed with a cold snare 2.   The colon was otherwise normal  RECOMMENDATIONS: 1. Repeat colonoscopy in 5 years if polyp adenomatous; otherwise 10 years   eSigned:  Eustace Quail, MD 11/15/2011 3:22 PM   cc:  Emeline General, MD and The Patient

## 2011-11-15 NOTE — Patient Instructions (Signed)
YOU HAD AN ENDOSCOPIC PROCEDURE TODAY AT THE [facility]: Refer to the procedure report that was given to you for any specific questions about what was found during the examination.  If the procedure report does not answer your questions, please call your gastroenterologist to clarify.  If you requested that your care partner not be given the details of your procedure findings, then the procedure report has been included in a sealed envelope for you to review at your convenience later.  YOU SHOULD EXPECT: Some feelings of bloating in the abdomen. Passage of more gas than usual.  Walking can help get rid of the air that was put into your GI tract during the procedure and reduce the bloating. If you had a lower endoscopy (such as a colonoscopy or flexible sigmoidoscopy) you may notice spotting of blood in your stool or on the toilet paper. If you underwent a bowel prep for your procedure, then you may not have a normal bowel movement for a few days.  DIET: Your first meal following the procedure should be a light meal and then it is ok to progress to your normal diet.  A half-sandwich or bowl of soup is an example of a good first meal.  Heavy or fried foods are harder to digest and may make you feel nauseous or bloated.  Likewise meals heavy in dairy and vegetables can cause extra gas to form and this can also increase the bloating.  Drink plenty of fluids but you should avoid alcoholic beverages for 24 hours.  ACTIVITY: Your care partner should take you home directly after the procedure.  You should plan to take it easy, moving slowly for the rest of the day.  You can resume normal activity the day after the procedure however you should NOT DRIVE or use heavy machinery for 24 hours (because of the sedation medicines used during the test).    SYMPTOMS TO REPORT IMMEDIATELY: A gastroenterologist can be reached at any hour.  During normal business hours, 8:30 AM to 5:00 PM Monday through Friday,  call [phone].  After hours and on weekends, please call the GI answering service at [phone] who will take a message and have the physician on call contact you.   Following lower endoscopy (colonoscopy or flexible sigmoidoscopy):  Excessive amounts of blood in the stool  Significant tenderness or worsening of abdominal pains  Swelling of the abdomen that is new, acute  Fever of 100F or higher    FOLLOW UP: If any biopsies were taken you will be contacted by phone or by letter within the next 1-3 weeks.  Call your gastroenterologist if you have not heard about the biopsies in 3 weeks.  Our staff will call the home number listed on your records the next business day following your procedure to check on you and address any questions or concerns that you may have at that time regarding the information given to you following your procedure. This is a courtesy call and so if there is no answer at the home number and we have not heard from you through the emergency physician on call, we will assume that you have returned to your regular daily activities without incident.  SIGNATURES/CONFIDENTIALITY: You and/or your care partner have signed paperwork which will be entered into your electronic medical record.  These signatures attest to the fact that that the information above on your After Visit Summary has been reviewed and is understood.  Full responsibility of the confidentiality   of this discharge information lies with you and/or your care-partner.     

## 2011-11-15 NOTE — Progress Notes (Signed)
Patient did not experience any of the following events: a burn prior to discharge; a fall within the facility; wrong site/side/patient/procedure/implant event; or a hospital transfer or hospital admission upon discharge from the facility. (G8907) Patient did not have preoperative order for IV antibiotic SSI prophylaxis. (G8918)  

## 2011-11-16 ENCOUNTER — Telehealth: Payer: Self-pay | Admitting: *Deleted

## 2011-11-16 NOTE — Telephone Encounter (Signed)
  Follow up Call-  Call back number 11/15/2011  Post procedure Call Back phone  # [phone]  Permission to leave phone message Yes     Patient questions:  Do you have a fever, pain , or abdominal swelling? no Pain Score  0 *  Have you tolerated food without any problems? yes  Have you been able to return to your normal activities? yes  Do you have any questions about your discharge instructions: Diet   no Medications  no Follow up visit  no  Do you have questions or concerns about your Care? no  Actions: * If pain score is 4 or above: No action needed, pain <4.

## 2011-11-20 ENCOUNTER — Encounter: Payer: Self-pay | Admitting: Internal Medicine

## 2011-12-07 ENCOUNTER — Other Ambulatory Visit: Payer: Self-pay | Admitting: Internal Medicine

## 2011-12-07 NOTE — Telephone Encounter (Signed)
Please check meds and see if this needs refilling.

## 2011-12-28 ENCOUNTER — Other Ambulatory Visit: Payer: Self-pay | Admitting: Internal Medicine

## 2012-02-28 ENCOUNTER — Ambulatory Visit: Payer: Self-pay

## 2012-03-11 ENCOUNTER — Other Ambulatory Visit: Payer: Self-pay | Admitting: Internal Medicine

## 2012-03-18 ENCOUNTER — Other Ambulatory Visit: Payer: Self-pay | Admitting: Internal Medicine

## 2012-03-19 ENCOUNTER — Encounter: Payer: Self-pay | Admitting: Internal Medicine

## 2012-03-23 IMAGING — CR DG SHOULDER 2+V*L*
3 series · 3 of 3 positions shown · non-contrast
Comparison: None.

CLINICAL DATA: Injured shoulder with pain and decreased range of
motion

LEFT SHOULDER - 2+ VIEW

[view not recorded (1 of 3)]
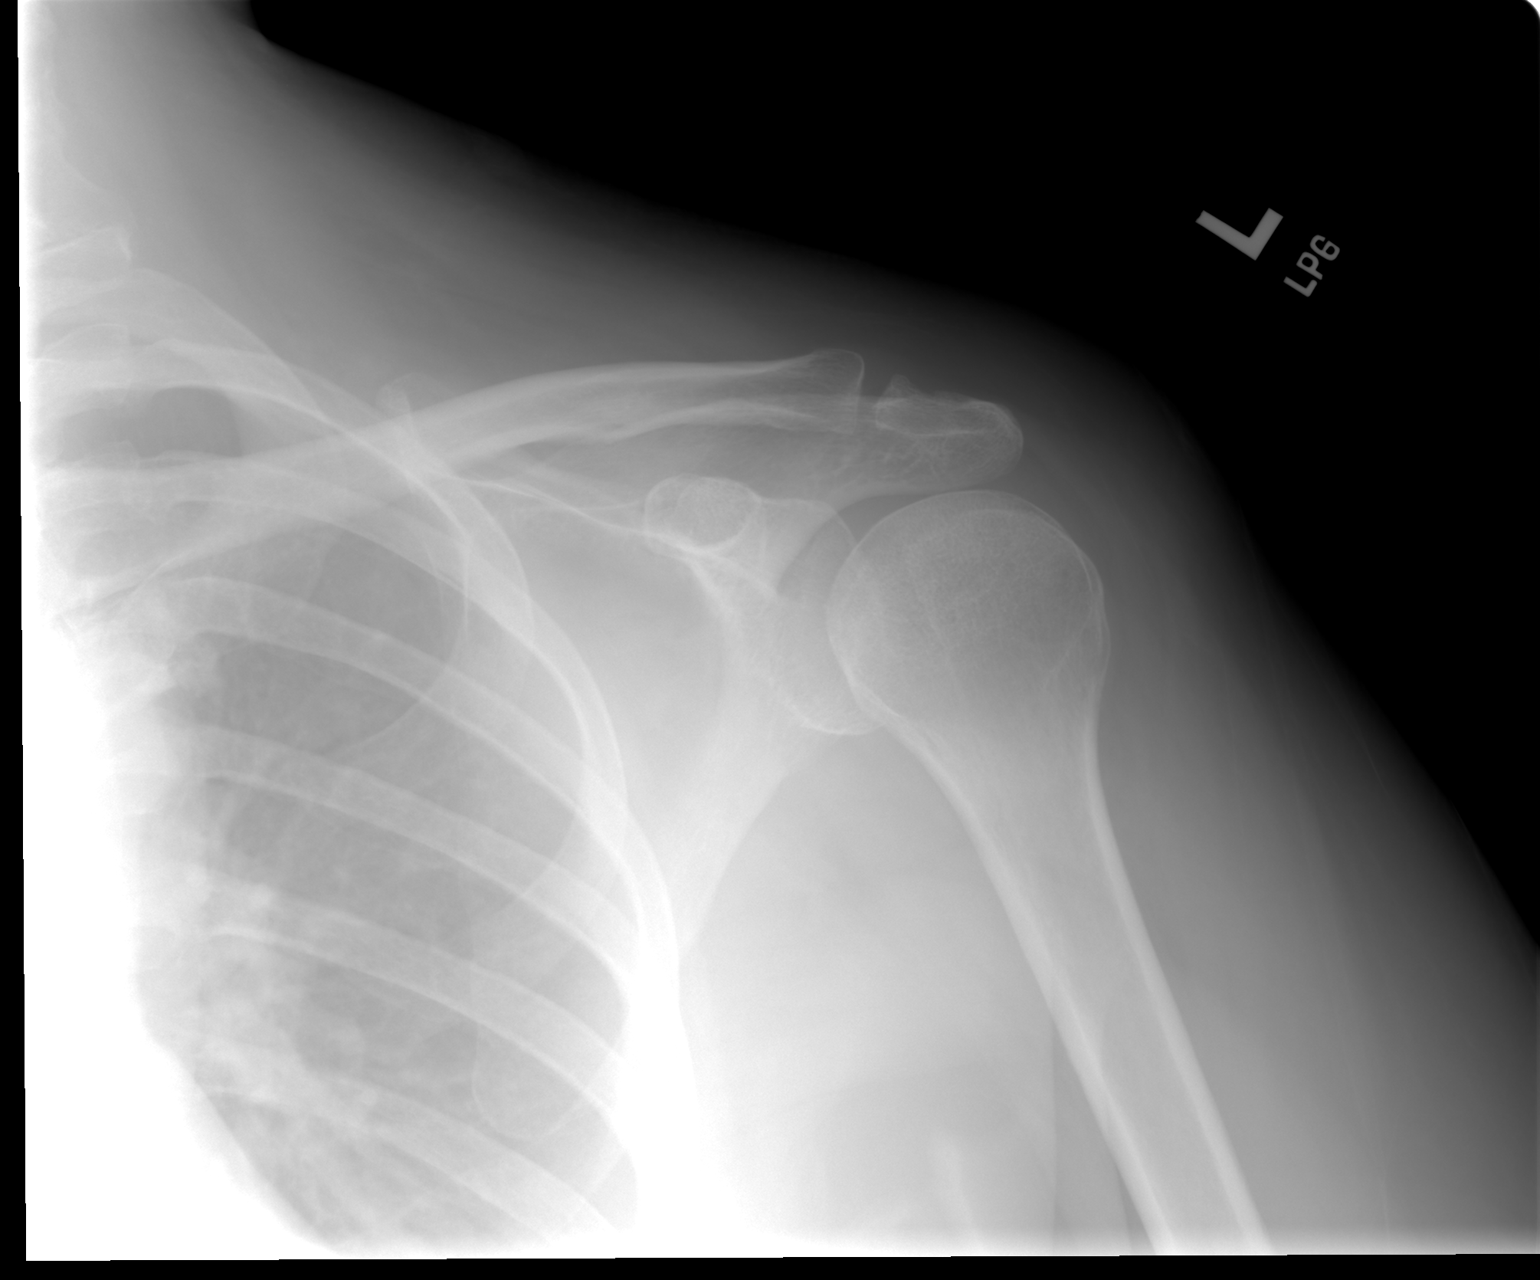

[view not recorded (2 of 3)]
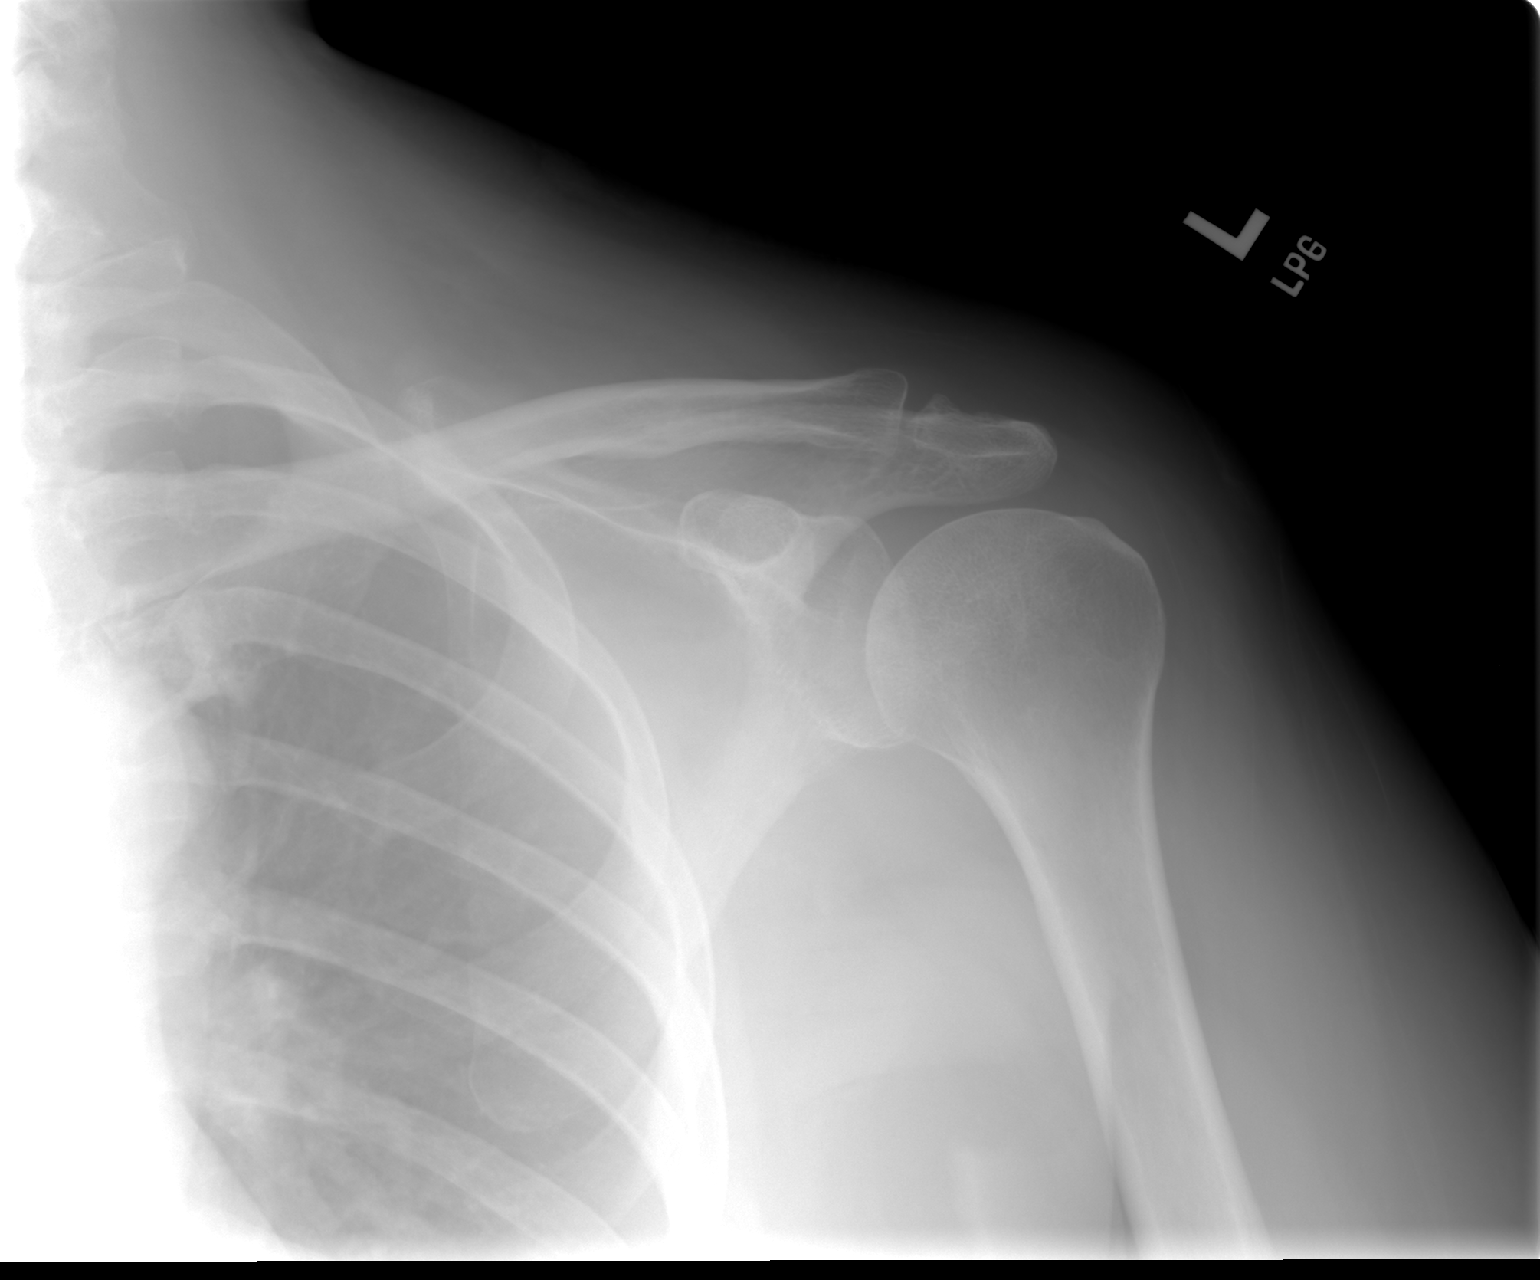

[view not recorded (3 of 3)]
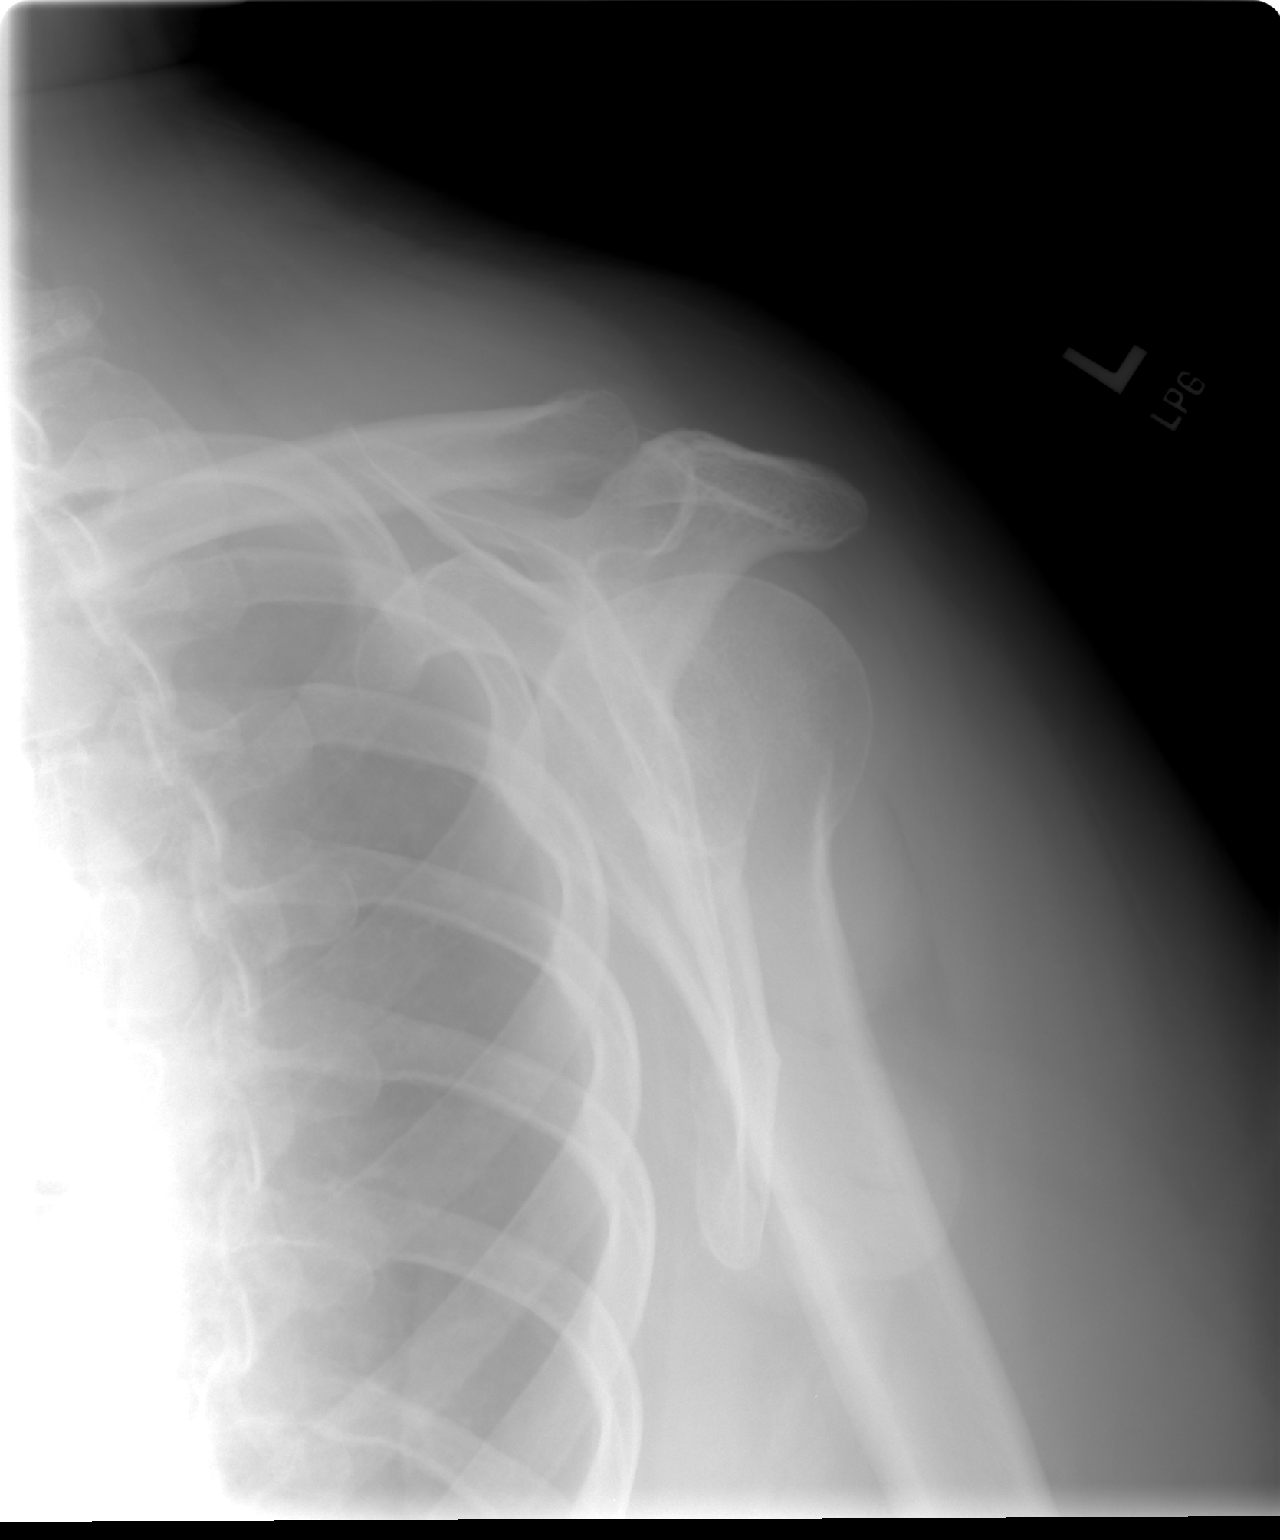

[3 of 3 positions shown; findings below may reference images not displayed]

FINDINGS: The left glenohumeral joint space appears normal.  The
left AC joint is normally aligned.  No acute bony abnormality is
noted.
IMPRESSION: No acute abnormality.

## 2012-04-30 ENCOUNTER — Other Ambulatory Visit: Payer: Self-pay | Admitting: Internal Medicine

## 2012-05-02 ENCOUNTER — Encounter: Payer: Self-pay | Admitting: Internal Medicine

## 2012-06-04 ENCOUNTER — Encounter: Payer: Self-pay | Admitting: Internal Medicine

## 2012-06-04 ENCOUNTER — Ambulatory Visit (INDEPENDENT_AMBULATORY_CARE_PROVIDER_SITE_OTHER): Payer: 59 | Admitting: Internal Medicine

## 2012-06-04 VITALS — BP 132/80 | Temp 98.1°F | Wt 241.0 lb

## 2012-06-04 DIAGNOSIS — A084 Viral intestinal infection, unspecified: Secondary | ICD-10-CM

## 2012-06-04 DIAGNOSIS — A088 Other specified intestinal infections: Secondary | ICD-10-CM

## 2012-06-04 NOTE — Progress Notes (Signed)
  Subjective:    Patient ID: Samantha Hahn, female    DOB: 09-18-1960, 52 y.o.   MRN: [id number]  HPI Patient was out of work recently due to gastroenteritis presumably due to Norovirus.symptoms started in April 4th.she was out of work for 4 days and then was off for several days. She needs FMLA form completed. Still having occasional loose stools. Episode was associated nausea vomiting and diarrhea. Still on a soft diet.     Review of Systems     Objective:   Physical Exam Chest clear to auscultation. Cardiac exam regular rate and rhythm. Abdomen no hepatosplenomegaly masses or tenderness         Assessment:  Gastroenteritis Gastroenteritis    ment:      Plan: FMLA form completed to cover being out of worksment & Plan:   Gastroenteritis secondary to Norovirus  Plan: FMLA form completed cover time off work. Continue to advance diet slowly.

## 2012-06-04 NOTE — Patient Instructions (Addendum)
Continue to advance diet slowly. Call if not better in 7-10 days

## 2012-06-14 ENCOUNTER — Other Ambulatory Visit: Payer: 59 | Admitting: Internal Medicine

## 2012-06-14 DIAGNOSIS — Z0000 Encounter for general adult medical examination without abnormal findings: Secondary | ICD-10-CM

## 2012-06-14 DIAGNOSIS — E119 Type 2 diabetes mellitus without complications: Secondary | ICD-10-CM

## 2012-06-14 LAB — COMPREHENSIVE METABOLIC PANEL
BUN: 25 mg/dL — ABNORMAL HIGH (ref 6–23)
CO2: 29 mEq/L (ref 19–32)
Calcium: 9 mg/dL (ref 8.4–10.5)
Chloride: 103 mEq/L (ref 96–112)
Creat: 1.32 mg/dL — ABNORMAL HIGH (ref 0.50–1.10)

## 2012-06-14 LAB — LIPID PANEL
Cholesterol: 137 mg/dL (ref 0–200)
HDL: 48 mg/dL (ref >39)
Total CHOL/HDL Ratio: 2.9 Ratio

## 2012-06-14 LAB — CBC WITH DIFFERENTIAL/PLATELET
Basophils Absolute: 0 10*3/uL (ref 0.0–0.1)
Eosinophils Relative: 9 % — ABNORMAL HIGH (ref 0–5)
HCT: 35.8 % — ABNORMAL LOW (ref 36.0–46.0)
Lymphocytes Relative: 20 % (ref 12–46)
Lymphs Abs: 1.2 10*3/uL (ref 0.7–4.0)
MCV: 87.3 fL (ref 78.0–100.0)
Monocytes Absolute: 0.4 10*3/uL (ref 0.1–1.0)
RDW: 13.9 % (ref 11.5–15.5)
WBC: 6.1 10*3/uL (ref 4.0–10.5)

## 2012-06-17 ENCOUNTER — Other Ambulatory Visit: Payer: Self-pay | Admitting: Internal Medicine

## 2012-06-17 ENCOUNTER — Encounter: Payer: Self-pay | Admitting: Internal Medicine

## 2012-06-17 ENCOUNTER — Ambulatory Visit (INDEPENDENT_AMBULATORY_CARE_PROVIDER_SITE_OTHER): Payer: 59 | Admitting: Internal Medicine

## 2012-06-17 VITALS — BP 126/68 | HR 80 | Temp 98.2°F | Ht 68.75 in | Wt 247.0 lb

## 2012-06-17 DIAGNOSIS — I10 Essential (primary) hypertension: Secondary | ICD-10-CM

## 2012-06-17 DIAGNOSIS — G4733 Obstructive sleep apnea (adult) (pediatric): Secondary | ICD-10-CM

## 2012-06-17 DIAGNOSIS — D126 Benign neoplasm of colon, unspecified: Secondary | ICD-10-CM

## 2012-06-17 DIAGNOSIS — Z8709 Personal history of other diseases of the respiratory system: Secondary | ICD-10-CM

## 2012-06-17 DIAGNOSIS — Z0000 Encounter for general adult medical examination without abnormal findings: Secondary | ICD-10-CM

## 2012-06-17 DIAGNOSIS — K219 Gastro-esophageal reflux disease without esophagitis: Secondary | ICD-10-CM

## 2012-06-17 DIAGNOSIS — E119 Type 2 diabetes mellitus without complications: Secondary | ICD-10-CM

## 2012-06-17 DIAGNOSIS — Z8659 Personal history of other mental and behavioral disorders: Secondary | ICD-10-CM

## 2012-06-17 LAB — POCT URINALYSIS DIPSTICK
Bilirubin, UA: NEGATIVE
Blood, UA: NEGATIVE
Glucose, UA: NEGATIVE
Ketones, UA: NEGATIVE
Leukocytes, UA: NEGATIVE
Nitrite, UA: NEGATIVE

## 2012-06-18 ENCOUNTER — Ambulatory Visit (INDEPENDENT_AMBULATORY_CARE_PROVIDER_SITE_OTHER): Payer: Self-pay | Admitting: Family Medicine

## 2012-06-18 ENCOUNTER — Other Ambulatory Visit: Payer: Self-pay | Admitting: Internal Medicine

## 2012-06-18 VITALS — BP 132/88 | Wt 145.0 lb

## 2012-06-18 DIAGNOSIS — E119 Type 2 diabetes mellitus without complications: Secondary | ICD-10-CM

## 2012-06-20 ENCOUNTER — Other Ambulatory Visit: Payer: Self-pay | Admitting: Internal Medicine

## 2012-06-21 LAB — CREATININE CLEARANCE, URINE, 24 HOUR
Creatinine, 24H Ur: 1178 mg/d (ref 700–1800)
Creatinine, Urine: 58.9 mg/dL
Creatinine: 1.36 mg/dL — ABNORMAL HIGH (ref 0.50–1.10)

## 2012-07-18 NOTE — Progress Notes (Signed)
Patient ID: Samantha Hahn, female   DOB: 12/11/1960, 52 y.o.   MRN: [id number] ATTENDING PHYSICIAN NOTE: I have reviewed the chart and agree with the plan as detailed above. Dorcas Mcmurray MD Pager [phone]

## 2012-07-31 ENCOUNTER — Other Ambulatory Visit: Payer: Self-pay | Admitting: Internal Medicine

## 2012-08-05 ENCOUNTER — Other Ambulatory Visit: Payer: Self-pay | Admitting: Internal Medicine

## 2012-08-30 ENCOUNTER — Other Ambulatory Visit: Payer: Self-pay

## 2012-08-30 DIAGNOSIS — Z1231 Encounter for screening mammogram for malignant neoplasm of breast: Secondary | ICD-10-CM

## 2012-09-19 ENCOUNTER — Ambulatory Visit
Admission: RE | Admit: 2012-09-19 | Discharge: 2012-09-19 | Disposition: A | Discharge status: Home / Self Care | Payer: 59 | Source: Ambulatory Visit

## 2012-09-19 DIAGNOSIS — Z1231 Encounter for screening mammogram for malignant neoplasm of breast: Secondary | ICD-10-CM

## 2012-10-22 ENCOUNTER — Ambulatory Visit (INDEPENDENT_AMBULATORY_CARE_PROVIDER_SITE_OTHER): Payer: Self-pay | Admitting: Family Medicine

## 2012-10-22 VITALS — BP 128/88 | Wt 248.0 lb

## 2012-10-22 DIAGNOSIS — E119 Type 2 diabetes mellitus without complications: Secondary | ICD-10-CM

## 2012-10-22 NOTE — Progress Notes (Signed)
Patient presents today for 3 month diabetes follow-up as part of the employer-sponsored Link to Wellness program. Current diabetes regimen includes Metformin, pioglitazone, glipizide, and Januvia. Patient also continues on daily ASA and statin. Most recent MD follow-up was this past April and patient has a 6 month visit pending for October. No med changes or major health changes at this time.   Of note, patient is being treated with Durezol eye drops for inflammation of the eye causing abnormal tear duct drainage and watery eyes. Patient is being followed closely by Dr. Vickki Muff, ophthalmologist, at [facility].   Diabetes Assessment Type of Diabetes: Type 2; Sees Diabetes provider 2 times per year; MD managing Diabetes Dr. Tommie Ard Baxley, endo; checks feet daily; checks blood glucose once daily; uses glucometer; takes medications as prescribed; takes an aspirin a day; A1c 6.5 (April 2014) Other Diabetes History:  Patient's current regimen includes Metformin 1000 mg twice daily, Januvia 100 mg daily, pioglitazone 45 mg daily, and glipizide ER 10 mg daily. She also continues on daily ASA and statin. Patient did not bring meter today but is now testing once daily, fasting, which is a huge improvement. She has noted a couple episodes of nighttime hypoglycemia over the past few months. These episodes were easily corrected within 15 minutes, but were noted as low as upper 40s. Patient has very specific symptoms of hypoglycemia including visual changes (appearance of flashing lights), and GI sx such as an itching sensation in abdomen.  I have provided patient with a handout of appropriate correction of hypoglycemia. Patient denies signs or symptoms of foot infection and inspects feet regularly. At last MD visit, patient's A1c was 6.5, she will have a repeat A1c in October.   Lifestyle Assessment: Exercise - Patient has made significant improvement in this area and is now exercising 3 days per week (Tues,  Wed, Thurs) at the [facility] for 45-60 min. Exercise consist mainly of cardio such as treadmill, eliptical, etc. Patient attends with her daughter once per week and takes her 66+ y/o parents with her the other two days.  Diet - Patient admits to deteriorating diet over the past few months. She is eating out more often now, generally up to 3 times per week. Most common places include ch[employer]a, [employer], etc. Patient attempts to make healthy choices and often orders a salad, but knows that she could do better eating at home. Breakfast - oatmeal, fruit. Lunch - Cafeteria 4 days per week.   Assessment: Additional Comments: Patient presents today with improved exercise and glucose testing. She continues to need improvement in the area of diet and weight loss and we will focus on this area over the next 3 months.   Plan: Diabetes Mellitus: Other 1) Continue to make healthy dietary choices 2) Continue to limit portion sizes 3) Weight loss goal 10 lbs over next 3 months (~1 lb per week) 4) Continue exercising 3 days per week - great job with this! 5) Continue testing regularly - great job with this!  Follow-up in 3 months on Tuesday December 2nd @ 1:30 pm

## 2012-11-14 ENCOUNTER — Ambulatory Visit (INDEPENDENT_AMBULATORY_CARE_PROVIDER_SITE_OTHER): Payer: 59 | Admitting: Internal Medicine

## 2012-11-14 ENCOUNTER — Encounter: Payer: Self-pay | Admitting: Internal Medicine

## 2012-11-14 VITALS — BP 124/80 | HR 76 | Temp 98.4°F | Wt 254.5 lb

## 2012-11-14 DIAGNOSIS — T783XXA Angioneurotic edema, initial encounter: Secondary | ICD-10-CM

## 2012-11-14 NOTE — Progress Notes (Signed)
  Subjective:    Patient ID: Samantha Hahn, female    DOB: January 07, 1961, 52 y.o.   MRN: [id number]  HPI  52 year old female with history of asthma in today with complaint of redness and swelling of face for the past couple of days. History of allergic rhinitis seen by Dr. Neldon Mc. She now has a dog in the home but says that she was tested for dog allergy and was found not to have that. Says she does have environmental allergies such as grasses and weeds. Has not been doing yard work. No new cosmetics. No new chemicals in the home. Says tip of her nose is tender and has been a bit weepy. It is erythematous. Says cheeks are itchy as well. No other rash. No fever. Patient complains of a bit of shortness of breath. History of asthma treated with albuterol and steroid inhalers.    Review of Systems     Objective:   Physical Exam has malar rash which is macular and erythematous. Her nose is erythematous. Some telangectasias on cheeks.        Assessment & Plan:  Allergic angioedema  Plan: Medrol 4 mg 6 day dosepak take as directed in tapering course. If symptoms return, see dermatologist

## 2012-11-14 NOTE — Patient Instructions (Addendum)
Take Medrol Dosepak in tapering course for 6 days. If rash returns, see dermatologist

## 2012-11-21 DIAGNOSIS — D126 Benign neoplasm of colon, unspecified: Secondary | ICD-10-CM

## 2012-11-21 HISTORY — DX: Benign neoplasm of colon, unspecified: D12.6

## 2012-11-21 NOTE — Progress Notes (Signed)
Subjective:    Patient ID: Samantha Hahn, female    DOB: 1960/03/04, 52 y.o.   MRN: [id number]  HPI 52 year old White female in today for health maintenance exam and evaluation of medical problems including type 2 diabetes mellitus, hypertension, history of asthma, allergic rhinitis,  bipolar disorder, GE reflux, migraine headaches, sleep apnea, history of PVCs.  Patient is followed by Dr. Caprice Beaver in [place] for bipolar disorder.  Diabetes is managed by Dr. Elyse Hsu.  Patient had vaginal hysterectomy in 2006. After that she had a GI consult for diarrhea, nausea, weight loss and abdominal bloating. Symptoms seemed to start after  vaginal hysterectomy. She was treated with Nexium and was given Flagyl for 5 days.  History of allergic rhinitis with positive allergy skin test to grasses, weeds, trees, dog and house dust mite. This was done by Dr. Neldon Mc in 2001. She also had spirometry showing small airways disease. Following albuterol, FEV1 did not change significantly. She has been diagnosed with asthma.  History of GE reflux with component of reflux inducing respiratory disease. Has been placed on Pulmicort and Rhinocort aqua as well as Allegra-D and Patanol eyedrops.  Patient had a cardiac evaluation in 2007 consisting of need for medicine study which was negative and a normal 2-D echocardiogram. At that time she had palpitations.  In 2011 patient fell at home and had a brief loss of consciousness. Was diagnosed with a minor head injury. CT of the C-spine and head were normal.  Social history: She is divorced and has a Scientist, water quality. Does not smoke or consume alcohol. Has one daughter. 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 She enjoys is much better and feels that it's less stressful.  Family history: Brother with history of hypertrophic cardiomyopathy. One  sister in good health. Parents in good health.  GYN is Dr. Caren Griffins [place]  Patient had colonoscopy in 2013. Had adenomatous colon polyp. Colonoscopy was done by Dr. Henrene Pastor.  Received flu vaccine through employment.  In 2013 she had adhesive capsulitis and had left shoulder release by Dr. Onnie Graham.  History of obstructive sleep apnea followed by [facility]     Review of Systems  Constitutional: Negative.   HENT: Negative.   Eyes: Negative.   Respiratory:       History of asthma  Cardiovascular: Negative.   Gastrointestinal: Negative.   Endocrine: Negative.        Diabetes management by endocrinologist  Genitourinary: Negative.   Allergic/Immunologic: Positive for environmental allergies.  Neurological: Negative.   Hematological: Negative.   Psychiatric/Behavioral:       History of bipolar disorder on medication and stable       Objective:   Physical Exam  Vitals reviewed. Constitutional: She is oriented to person, place, and time. She appears well-developed and well-nourished. No distress.  HENT:  Head: Normocephalic and atraumatic.  Right Ear: External ear normal.  Left Ear: External ear normal.  Mouth/Throat: Oropharynx is clear and moist.  Eyes: Conjunctivae and EOM are normal. Pupils are equal, round, and reactive to light. Right eye exhibits no discharge. Left eye exhibits no discharge. No scleral icterus.  Neck: Neck supple. No JVD present. No thyromegaly present.  Cardiovascular: Normal rate, regular rhythm, normal heart sounds and intact distal pulses.   No murmur heard. Pulmonary/Chest: Effort normal and breath sounds normal. No respiratory distress. She has no wheezes. She  has no rales. She exhibits no tenderness.  Breasts normal female  Abdominal: Soft. Bowel sounds are normal. She exhibits no distension and no mass. There is no tenderness. There is no rebound and no guarding.  Genitourinary:  Deferred to GYN  Musculoskeletal: Normal range of motion.  She exhibits no edema.  Lymphadenopathy:    She has no cervical adenopathy.  Neurological: She is alert and oriented to person, place, and time. She has normal reflexes. No cranial nerve deficit. Coordination normal.  Skin: Skin is warm and dry. No rash noted.  Psychiatric: She has a normal mood and affect. Her behavior is normal. Judgment and thought content normal.          Assessment & Plan:  Controlled type 2 diabetes mellitus  History of allergic rhinitis  History of asthma  History of bipolar disorder  GE reflux  Hypertension  History of adenomatous colon polyp on colonoscopy 2013  Plan: Return in 6 months for office visit, followup on multiple medical issues. Recommend annual mammogram. Will receive influenza vaccine through employment. Continue same medications.

## 2012-11-21 NOTE — Patient Instructions (Addendum)
Continue same medications and return in 6 months. Patient is followed by wellness clinic at [facility]

## 2012-11-26 ENCOUNTER — Telehealth: Payer: Self-pay | Admitting: Internal Medicine

## 2012-11-26 MED ORDER — METHYLPREDNISOLONE (PAK) 4 MG PO TABS: ORAL_TABLET | ORAL | Status: DC

## 2012-11-26 NOTE — Telephone Encounter (Signed)
Cannot get in at [facility] until March.  I called Dr. Syble Creek and she has appointment with him 10/27 @ 9:00 a.m.  Patient is happy with that.  However, she's having a flare up now.  Wants to know if you will give her another course of steroids and something for the itching to help get her through until her appointment.  Please advise.  Thanks.

## 2012-11-26 NOTE — Telephone Encounter (Signed)
See if [facility] has opening.

## 2012-12-02 ENCOUNTER — Other Ambulatory Visit: Payer: Self-pay | Admitting: Internal Medicine

## 2012-12-05 ENCOUNTER — Ambulatory Visit (INDEPENDENT_AMBULATORY_CARE_PROVIDER_SITE_OTHER): Payer: 59 | Admitting: Internal Medicine

## 2012-12-05 ENCOUNTER — Encounter: Payer: Self-pay | Admitting: Internal Medicine

## 2012-12-05 VITALS — BP 120/80 | HR 64 | Temp 98.2°F | Ht 67.0 in | Wt 244.0 lb

## 2012-12-05 DIAGNOSIS — E119 Type 2 diabetes mellitus without complications: Secondary | ICD-10-CM

## 2012-12-05 DIAGNOSIS — H6590 Unspecified nonsuppurative otitis media, unspecified ear: Secondary | ICD-10-CM

## 2012-12-05 DIAGNOSIS — H6593 Unspecified nonsuppurative otitis media, bilateral: Secondary | ICD-10-CM

## 2012-12-05 DIAGNOSIS — L719 Rosacea, unspecified: Secondary | ICD-10-CM

## 2012-12-05 DIAGNOSIS — J029 Acute pharyngitis, unspecified: Secondary | ICD-10-CM

## 2012-12-05 DIAGNOSIS — J069 Acute upper respiratory infection, unspecified: Secondary | ICD-10-CM

## 2012-12-05 LAB — HEMOGLOBIN A1C: Hgb A1c MFr Bld: 7.7 % — ABNORMAL HIGH (ref <5.7)

## 2012-12-05 LAB — POCT RAPID STREP A (OFFICE): Rapid Strep A Screen: NEGATIVE

## 2012-12-05 MED ORDER — LEVOFLOXACIN 500 MG PO TABS: 500.0000 mg | ORAL_TABLET | Freq: Every day | ORAL | Status: DC

## 2012-12-05 NOTE — Progress Notes (Signed)
  Subjective:    Patient ID: Samantha Hahn, female    DOB: [birth year], 52 y.o.   MRN: [id number]  HPI Onset of URI symptoms 2 days ago. Has felt hot but no shaking chills and no documented fever. Cough with discolored sputum production. No frank wheezing. Continues to have issues with redness of her cheeks and swollen eyes. Has appointment see Dr. Neldon Mc tomorrow regarding possible angioedema and dermatologist later this month. Complains of sore throat. Has coryza.  Also due for six-month recheck on diabetes. Hemoglobin A1c drawn today.    Review of Systems     Objective:   Physical Exam HEENT exam: Pharynx is red. Rapid strep screen negative. TMs are slightly full bilaterally but not red. Neck is supple without significant adenopathy. Chest clear to auscultation. Sounds nasally congested. Cheeks are red. Eyes appear to be slightly swollen.        Assessment & Plan:  Acute URI  History of asthma  Controlled type 2 diabetes mellitus  Bilateral serous otitis media  Non-strep pharyngitis  Possible acne rosacea causing redness of cheeks  Rule out angioedema  Plan: To see allergist tomorrow dermatologist later this month. Levaquin 500 milligrams daily for 7 days.

## 2012-12-05 NOTE — Patient Instructions (Signed)
Keep appt with Dr. Neldon Mc tomorrow. Use inhaler as needed. CPE due in 6 months.

## 2012-12-06 ENCOUNTER — Ambulatory Visit
Admission: RE | Admit: 2012-12-06 | Discharge: 2012-12-06 | Disposition: A | Discharge status: Home / Self Care | Payer: 59 | Source: Ambulatory Visit | Attending: Allergy and Immunology | Admitting: Allergy and Immunology

## 2012-12-06 ENCOUNTER — Other Ambulatory Visit: Payer: Self-pay | Admitting: Allergy and Immunology

## 2012-12-06 ENCOUNTER — Other Ambulatory Visit: Payer: Self-pay | Admitting: Internal Medicine

## 2012-12-06 DIAGNOSIS — J45909 Unspecified asthma, uncomplicated: Secondary | ICD-10-CM

## 2012-12-09 ENCOUNTER — Emergency Department (HOSPITAL_COMMUNITY): Payer: 59

## 2012-12-09 ENCOUNTER — Encounter (HOSPITAL_COMMUNITY): Payer: Self-pay | Admitting: Emergency Medicine

## 2012-12-09 ENCOUNTER — Emergency Department (HOSPITAL_COMMUNITY)
Admission: EM | Admit: 2012-12-09 | Discharge: 2012-12-09 | Disposition: A | Discharge status: Home / Self Care | Payer: 59 | Attending: Emergency Medicine | Admitting: Emergency Medicine

## 2012-12-09 DIAGNOSIS — R200 Anesthesia of skin: Secondary | ICD-10-CM

## 2012-12-09 DIAGNOSIS — F329 Major depressive disorder, single episode, unspecified: Secondary | ICD-10-CM | POA: Insufficient documentation

## 2012-12-09 DIAGNOSIS — K219 Gastro-esophageal reflux disease without esophagitis: Secondary | ICD-10-CM | POA: Insufficient documentation

## 2012-12-09 DIAGNOSIS — J45909 Unspecified asthma, uncomplicated: Secondary | ICD-10-CM | POA: Insufficient documentation

## 2012-12-09 DIAGNOSIS — E119 Type 2 diabetes mellitus without complications: Secondary | ICD-10-CM | POA: Insufficient documentation

## 2012-12-09 DIAGNOSIS — Z885 Allergy status to narcotic agent status: Secondary | ICD-10-CM | POA: Insufficient documentation

## 2012-12-09 DIAGNOSIS — Z79899 Other long term (current) drug therapy: Secondary | ICD-10-CM | POA: Insufficient documentation

## 2012-12-09 DIAGNOSIS — Z9109 Other allergy status, other than to drugs and biological substances: Secondary | ICD-10-CM | POA: Insufficient documentation

## 2012-12-09 DIAGNOSIS — F411 Generalized anxiety disorder: Secondary | ICD-10-CM | POA: Insufficient documentation

## 2012-12-09 DIAGNOSIS — R209 Unspecified disturbances of skin sensation: Secondary | ICD-10-CM | POA: Insufficient documentation

## 2012-12-09 DIAGNOSIS — G4730 Sleep apnea, unspecified: Secondary | ICD-10-CM | POA: Insufficient documentation

## 2012-12-09 DIAGNOSIS — I10 Essential (primary) hypertension: Secondary | ICD-10-CM | POA: Insufficient documentation

## 2012-12-09 DIAGNOSIS — R519 Headache, unspecified: Secondary | ICD-10-CM

## 2012-12-09 DIAGNOSIS — J4489 Other specified chronic obstructive pulmonary disease: Secondary | ICD-10-CM | POA: Insufficient documentation

## 2012-12-09 DIAGNOSIS — Z888 Allergy status to other drugs, medicaments and biological substances status: Secondary | ICD-10-CM | POA: Insufficient documentation

## 2012-12-09 DIAGNOSIS — M542 Cervicalgia: Secondary | ICD-10-CM | POA: Insufficient documentation

## 2012-12-09 DIAGNOSIS — D649 Anemia, unspecified: Secondary | ICD-10-CM | POA: Insufficient documentation

## 2012-12-09 DIAGNOSIS — Z8742 Personal history of other diseases of the female genital tract: Secondary | ICD-10-CM | POA: Insufficient documentation

## 2012-12-09 DIAGNOSIS — J449 Chronic obstructive pulmonary disease, unspecified: Secondary | ICD-10-CM | POA: Insufficient documentation

## 2012-12-09 DIAGNOSIS — Z7982 Long term (current) use of aspirin: Secondary | ICD-10-CM | POA: Insufficient documentation

## 2012-12-09 DIAGNOSIS — Z8669 Personal history of other diseases of the nervous system and sense organs: Secondary | ICD-10-CM | POA: Insufficient documentation

## 2012-12-09 DIAGNOSIS — N951 Menopausal and female climacteric states: Secondary | ICD-10-CM | POA: Insufficient documentation

## 2012-12-09 DIAGNOSIS — R110 Nausea: Secondary | ICD-10-CM | POA: Insufficient documentation

## 2012-12-09 DIAGNOSIS — Z881 Allergy status to other antibiotic agents status: Secondary | ICD-10-CM | POA: Insufficient documentation

## 2012-12-09 DIAGNOSIS — F3289 Other specified depressive episodes: Secondary | ICD-10-CM | POA: Insufficient documentation

## 2012-12-09 DIAGNOSIS — R51 Headache: Secondary | ICD-10-CM | POA: Insufficient documentation

## 2012-12-09 LAB — GLUCOSE, CAPILLARY: Glucose-Capillary: 126 mg/dL — ABNORMAL HIGH (ref 70–99)

## 2012-12-09 LAB — CBC
MCHC: 33.2 g/dL (ref 30.0–36.0)
Platelets: 196 10*3/uL (ref 150–400)
RDW: 14.2 % (ref 11.5–15.5)
WBC: 8.8 10*3/uL (ref 4.0–10.5)

## 2012-12-09 LAB — BASIC METABOLIC PANEL
CO2: 26 mEq/L (ref 19–32)
Calcium: 9.7 mg/dL (ref 8.4–10.5)
Creatinine, Ser: 1.49 mg/dL — ABNORMAL HIGH (ref 0.50–1.10)
GFR calc Af Amer: 45 mL/min — ABNORMAL LOW (ref >90)
GFR calc non Af Amer: 39 mL/min — ABNORMAL LOW (ref >90)
Glucose, Bld: 126 mg/dL — ABNORMAL HIGH (ref 70–99)

## 2012-12-09 LAB — URINALYSIS, ROUTINE W REFLEX MICROSCOPIC
Nitrite: NEGATIVE
Protein, ur: NEGATIVE mg/dL
Specific Gravity, Urine: 1.013 (ref 1.005–1.030)
Urobilinogen, UA: 0.2 mg/dL (ref 0.0–1.0)

## 2012-12-09 LAB — URINE MICROSCOPIC-ADD ON

## 2012-12-09 MED ORDER — SUMATRIPTAN SUCCINATE 6 MG/0.5ML ~~LOC~~ SOLN
6.0000 mg | Freq: Once | SUBCUTANEOUS | Status: AC
  Administered 2012-12-09: 6 mg via SUBCUTANEOUS
  Filled 2012-12-09: qty 0.5

## 2012-12-09 NOTE — ED Notes (Signed)
Patient not in room at MRI

## 2012-12-09 NOTE — ED Provider Notes (Signed)
5:48 PM Patient signed out to me by Dr. Eulis Foster.  Patient with headache and some numbness.  He spoke with neurology who advised mri and patient may be d/c'd if mri negative.  MRI pending.   7:56 PM I have reviewed the report and personally reviewed the above radiology studies.  Patient with normal brain mri.  Patient with normal neuro exam now strength 5/5 bilateral upper and lower extremities equal elbow and knee dtrs,headache improved.  Patient advised to follow up with pmd and given return precautions.    Shaune Pollack, MD 12/09/12 [phone]

## 2012-12-09 NOTE — ED Notes (Signed)
Patient transported to CT 

## 2012-12-09 NOTE — ED Notes (Signed)
Pt reports sudden onset of Left side facial numbness and posterior headache starting approx 2 hours ago. No neuro deficits noted, grips and strengths are equal bilaterally, no arm drift, there is a slight asymmetrical noted to Left side of mouth. Pt reports she is a diabetic, CBG in triage is 118, and a history of migraine headache but they usually start in the anterior part of her head not posterior and she wakes up with her migraines. Pt is also being worked up to rule out Rosacea and or Lupus and is currently taking Doxycycline.

## 2012-12-09 NOTE — ED Provider Notes (Signed)
CSN: [id number]     Arrival date & time 12/09/12  1242 History   First MD Initiated Contact with Patient 12/09/12 1346     Chief Complaint  Patient presents with  . Numbness  . Headache   (Consider location/radiation/quality/duration/timing/severity/associated sxs/prior Treatment) HPI Comments: PATRCIA Hahn is a 52 y.o. female who presents for evaluation of left facial numbness. She noticed the numbness starting at 10 AM this morning. Since then. She has also noticed a diffuse posterior headache. She denies blurred vision, double clear. Leg weakness, and gait problem, or vomiting. She's had mild nausea, and mild, neck pain. She denies back pain. She is taking her usual medicines, without relief. She did not take anything for the headache. She has had migraines in the past, but never had symptoms like this with a migraine headache. No known sick contacts. She came to work today, after onset of symptoms, but decided to come here for evaluation, because the numbness persisted. She has not noticed altered taste or drooling. There are no other known modifying factors.  Patient is a 52 y.o. female presenting with headaches. The history is provided by the patient.  Headache   Past Medical History  Diagnosis Date  . Asthma with COPD with status asthmaticus   . Allergy   . Acne rosacea   . GERD (gastroesophageal reflux disease)   . Diabetes mellitus   . Migraines   . Hot flashes   . Anemia   . Abnormal vaginal Pap smear   . Anxiety   . Dysrhythmia     pvc  . Depression     resolved  . Hypertension      resolved,     dr hochrein  . Sleep apnea     workup still in process   Past Surgical History  Procedure Laterality Date  . Abdominal hysterectomy    . Shoulder arthroscopy  06/08/2011    Procedure: ARTHROSCOPY SHOULDER;  Surgeon: Marin Shutter, MD;  Location: [facility];  Service: Orthopedics;  Laterality: Left;  MUA, LOA, SAD, DCR   Family History  Problem Relation Age of Onset  .  Heart disease Mother 16    CAD  . Asthma Mother   . Asthma Sister   . Colon cancer Neg Hx   . Esophageal cancer Neg Hx   . Rectal cancer Neg Hx   . Stomach cancer Neg Hx    History  Substance Use Topics  . Smoking status: Never Smoker   . Smokeless tobacco: Never Used  . Alcohol Use: Yes     Comment: occ   OB History   Grav Para Term Preterm Abortions TAB SAB Ect Mult Living                 Review of Systems  Neurological: Positive for headaches.  All other systems reviewed and are negative.    Allergies  Clarithromycin; Vicodin; and Exenatide  Home Medications   Current Outpatient Rx  Name  Route  Sig  Dispense  Refill  . albuterol (PROVENTIL HFA;VENTOLIN HFA) 108 (90 BASE) MCG/ACT inhaler   Inhalation   Inhale 2 puffs into the lungs every 6 (six) hours as needed. For shortness of breath         . aspirin 325 MG tablet   Oral   Take 325 mg by mouth daily.         Marland Kitchen atorvastatin (LIPITOR) 20 MG tablet   Oral   Take 20 mg by mouth daily.         Marland Kitchen  Calcium-Vitamin D 600-200 MG-UNIT per tablet   Oral   Take 1 tablet by mouth daily.         Marland Kitchen doxycycline (VIBRA-TABS) 100 MG tablet   Oral   Take 100 mg by mouth daily.         . ferrous fumarate (HEMOCYTE - 106 MG FE) 325 (106 FE) MG TABS   Oral   Take 1 tablet by mouth daily.         Marland Kitchen glipiZIDE (GLUCOTROL XL) 10 MG 24 hr tablet   Oral   Take 10 mg by mouth daily.         Marland Kitchen levofloxacin (LEVAQUIN) 500 MG tablet   Oral   Take 1 tablet (500 mg total) by mouth daily.   7 tablet   0   . loratadine (CLARITIN) 10 MG tablet   Oral   Take 10 mg by mouth daily after breakfast. For allergies         . metFORMIN (GLUCOPHAGE) 1000 MG tablet   Oral   Take 1,000 mg by mouth 2 (two) times daily with a meal.         . metoprolol succinate (TOPROL-XL) 50 MG 24 hr tablet   Oral   Take 50 mg by mouth daily. Take with or immediately following a meal.         . mometasone-formoterol (DULERA)  100-5 MCG/ACT AERO   Inhalation   Inhale 2 puffs into the lungs 2 (two) times daily.         . multivitamin (THERAGRAN) per tablet   Oral   Take 1 tablet by mouth daily.           . pioglitazone (ACTOS) 45 MG tablet      TAKE 1 TABLET BY MOUTH DAILY   90 tablet   PRN   . sitaGLIPtin (JANUVIA) 100 MG tablet   Oral   Take 100 mg by mouth daily.         . SUMAtriptan (IMITREX) 100 MG tablet   Oral   Take 100 mg by mouth every 2 (two) hours as needed.           Marland Kitchen GNP Lancets MISC      Test bid   200 each   3   . TRUETEST TEST test strip      TEST TWICE DAILY AS NEEDED   200 each   PRN    BP 125/64  Pulse 72  Temp(Src) 98.5 F (36.9 C) (Oral)  Resp 12  Wt 254 lb 9.6 oz (115.486 kg)  BMI 39.87 kg/m2  SpO2 100% Physical Exam  Nursing note and vitals reviewed. Constitutional: She is oriented to person, place, and time. She appears well-developed and well-nourished.  HENT:  Head: Normocephalic and atraumatic.  Eyes: Conjunctivae and EOM are normal. Pupils are equal, round, and reactive to light.  Neck: Normal range of motion and phonation normal. Neck supple.  Cardiovascular: Normal rate, regular rhythm and intact distal pulses.   Pulmonary/Chest: Effort normal and breath sounds normal. She exhibits no tenderness.  Abdominal: Soft. She exhibits no distension. There is no tenderness. There is no guarding.  Musculoskeletal: Normal range of motion.  Neurological: She is alert and oriented to person, place, and time. She exhibits normal muscle tone.  Decreased sensation left forehead, left cheek and left chin. No dysarthria, no aphasia, no nystagmus, no ataxia. No dysmetria- normal finger to nose and heel to shin, bilaterally.  Skin: Skin is warm and dry.  Psychiatric: She has a normal mood and affect. Her behavior is normal. Judgment and thought content normal.    ED Course  Procedures (including critical care time)  . Medications  SUMAtriptan (IMITREX)  injection 6 mg (6 mg Subcutaneous Given 12/09/12 1411)    Patient Vitals for the past 24 hrs:  BP Temp Temp src Pulse Resp SpO2 Weight  12/09/12 1439 125/64 mmHg - - 72 12 100 % -  12/09/12 1430 125/64 mmHg - - 73 11 99 % -  12/09/12 1350 - 98.5 F (36.9 C) - - - - -  12/09/12 1345 97/48 mmHg - - 81 - 100 % -  12/09/12 1310 - - - 84 - 99 % -  12/09/12 1309 123/69 mmHg - - - - - -  12/09/12 1245 140/70 mmHg 98.1 F (36.7 C) Oral 66 16 97 % 254 lb 9.6 oz (115.486 kg)   3:57 PM Reevaluation with update and discussion. After initial assessment and treatment, an updated evaluation reveals now, down to 2/10, from 8/10. Jakyla Reza L    3:58 PM-Consult complete with Stroke Neurologist, . Patient case explained and discussed. He agrees with MRI to evaluate further for CVA, and if that is negative,  patient can be treated in the outpatient setting for further evaluation and treatment, by her PCP. Call ended at [place], CAPILLARY - Abnormal; Notable for the following:    Glucose-Capillary 118 (*)    All other components within normal limits  BASIC METABOLIC PANEL - Abnormal; Notable for the following:    Glucose, Bld 126 (*)    BUN 32 (*)    Creatinine, Ser 1.49 (*)    GFR calc non Af Amer 39 (*)    GFR calc Af Amer 45 (*)    All other components within normal limits  CBC - Abnormal; Notable for the following:    Hemoglobin 11.7 (*)    HCT 35.2 (*)    All other components within normal limits  GLUCOSE, CAPILLARY - Abnormal; Notable for the following:    Glucose-Capillary 126 (*)    All other components within normal limits  URINALYSIS, ROUTINE W REFLEX MICROSCOPIC   Imaging Review Ct Head Wo Contrast  12/09/2012   CLINICAL DATA:  Sudden onset left facial numbness, headache  EXAM: CT HEAD WITHOUT CONTRAST  TECHNIQUE: Contiguous axial images were obtained from the base of the skull through the vertex without intravenous contrast.  COMPARISON:   03/03/2009  FINDINGS: Volume averaging artifact with the roof of the sphenoid sinus incidentally noted image 13. No acute hemorrhage, infarct, or mass lesion is identified. No midline shift. Orbits and paranasal sinuses are intact. No soft tissue abnormality. No skull fracture.  IMPRESSION: No acute intracranial finding.   Electronically Signed   By: Conchita Paris M.D.   On: 12/09/2012 15:23    EKG Interpretation     Ventricular Rate:  89 PR Interval:  154 QRS Duration: 70 QT Interval:  370 QTC Calculation: 450 R Axis:   -2 Text Interpretation:  Sinus rhythm with frequent Premature ventricular complexes Cannot rule out Inferior infarct , age undetermined Anterior infarct , age undetermined Abnormal ECG No old tracing to compare            MDM   1. Headache   2. Facial numbness      Facial numbness with headache, and a migraine patient. This is unlikely to represent TIA or CVA. Initial imaging with CT head is  negative. MRI, ordered, and consultation with stroke neurologist.   Nursing Notes Reviewed/ Care Coordinated, and agree without changes. Applicable Imaging Reviewed.  Interpretation of Laboratory Data incorporated into ED treatment   Plan: Reassess after MRI and if sx are continuing to improve and no additional symptoms, and no stroke on MR, discharge, with outpatient followup.  Care to Dr. Jeanell Sparrow- [place]    Richarda Blade, MD 12/09/12 [phone]

## 2012-12-10 ENCOUNTER — Ambulatory Visit: Payer: Self-pay | Admitting: Internal Medicine

## 2012-12-13 ENCOUNTER — Telehealth: Payer: Self-pay | Admitting: *Deleted

## 2012-12-13 NOTE — Telephone Encounter (Deleted)
error 

## 2012-12-16 NOTE — Telephone Encounter (Signed)
Opened in error

## 2012-12-17 ENCOUNTER — Encounter: Payer: 59 | Attending: Internal Medicine | Admitting: Dietician

## 2012-12-17 VITALS — Ht 68.0 in | Wt 254.6 lb

## 2012-12-17 DIAGNOSIS — E119 Type 2 diabetes mellitus without complications: Secondary | ICD-10-CM | POA: Insufficient documentation

## 2012-12-17 DIAGNOSIS — Z713 Dietary counseling and surveillance: Secondary | ICD-10-CM | POA: Insufficient documentation

## 2012-12-17 DIAGNOSIS — G43709 Chronic migraine without aura, not intractable, without status migrainosus: Secondary | ICD-10-CM

## 2012-12-18 NOTE — Patient Instructions (Signed)
Goals:  Monitor glucose levels as instructed by your doctor  Bring food record and glucose log to your next nutrition visit 

## 2012-12-18 NOTE — Progress Notes (Signed)
Patient was seen on 12/17/12 for the first of a series of three diabetes self-management courses at the [facility].  Current HbA1c: 7.7 on 12/05/12  The following learning objectives were met by the patient during this class:  Describe diabetes  State some common risk factors for diabetes  Defines the role of glucose and insulin  Identifies type of diabetes and pathophysiology  Describe the relationship between diabetes and cardiovascular risk  State the members of the Healthcare Team  States the rationale for glucose monitoring  State when to test glucose  State their individual Target Range  State the importance of logging glucose readings  Describe how to interpret glucose readings  Identifies A1C target  Explain the correlation between A1c and eAG values  State symptoms and treatment of high blood glucose  State symptoms and treatment of low blood glucose  Explain proper technique for glucose testing  Identifies proper sharps disposal  Handouts given during class include:  Living Well with Diabetes book  Carb Counting and Meal Planning book  Meal Plan Card  Carbohydrate guide  Meal planning worksheet  Low Sodium Flavoring Tips  The diabetes portion plate  Low Carbohydrate Snack Suggestions  A1c to eAG Conversion Chart  Diabetes Medications  Stress Management  Diabetes Recommended Care Schedule  Diabetes Success Plan  Core Class Satisfaction Survey  Your patient has identified their diabetes care support plan as:  [facility]  Staff  Follow-Up Plan:  Attend core 2

## 2012-12-24 ENCOUNTER — Encounter: Payer: 59 | Attending: Internal Medicine

## 2012-12-24 DIAGNOSIS — E119 Type 2 diabetes mellitus without complications: Secondary | ICD-10-CM | POA: Insufficient documentation

## 2012-12-24 DIAGNOSIS — Z713 Dietary counseling and surveillance: Secondary | ICD-10-CM | POA: Insufficient documentation

## 2012-12-25 NOTE — Progress Notes (Signed)
Patient was seen on 12/24/12 for the second of a series of three diabetes self-management courses at the [facility]. The following learning objectives were met by the patient during this class:   Describe the role of different macronutrients on glucose  Explain how carbohydrates affect blood glucose  State what foods contain the most carbohydrates  Demonstrate carbohydrate counting  Demonstrate how to read Nutrition Facts food label  Describe effects of various fats on heart health  Describe the importance of good nutrition for health and healthy eating strategies  Describe techniques for managing your shopping, cooking and meal planning  List strategies to follow meal plan when dining out  Describe the effects of alcohol on glucose and how to use it safely  Follow-Up Plan:  Attend Core 3  Work towards following your personal food plan.    

## 2012-12-30 ENCOUNTER — Other Ambulatory Visit: Payer: Self-pay | Admitting: Internal Medicine

## 2012-12-31 ENCOUNTER — Encounter: Payer: Self-pay | Admitting: Neurology

## 2012-12-31 ENCOUNTER — Ambulatory Visit (INDEPENDENT_AMBULATORY_CARE_PROVIDER_SITE_OTHER): Payer: 59 | Admitting: Neurology

## 2012-12-31 VITALS — BP 118/78 | HR 62 | Temp 97.8°F | Ht 68.0 in | Wt 255.0 lb

## 2012-12-31 DIAGNOSIS — E119 Type 2 diabetes mellitus without complications: Secondary | ICD-10-CM

## 2012-12-31 DIAGNOSIS — G43009 Migraine without aura, not intractable, without status migrainosus: Secondary | ICD-10-CM

## 2012-12-31 MED ORDER — TOPIRAMATE 50 MG PO TABS: ORAL_TABLET | ORAL | Status: DC

## 2012-12-31 MED ORDER — SUMATRIPTAN SUCCINATE 100 MG PO TABS: 100.0000 mg | ORAL_TABLET | Freq: Once | ORAL | Status: DC | PRN

## 2012-12-31 NOTE — Progress Notes (Signed)
NEUROLOGY CONSULTATION NOTE  LUEVENIA COLASURDO MRN: [id number] DOB: 18-Jan-1961  Referring provider: Dr. Renold Genta Primary care provider: Dr. Renold Genta  Reason for consult:  Migraine  HISTORY OF PRESENT ILLNESS: Samantha Hahn is a 52 year old right-handed woman with history of type II diabetes mellitus, asthma, obstructive sleep apnea, and GERD who presents for headache.  Records and images were personally reviewed where available.    She has history of migraines but has not had many since 2010.  In September, she had a couple of her typical migraines.  These are described as left frontal throbbing pain, 10/10, and associated with nausea, photophobia and phonophobia.  If they occur during the day, she will take an Advil.  If she wakes up with one, she takes Excedrin, which takes longer to abort.  On 12/09/12, she woke up with a different type of headache.  This headache was in the bilateral occipital region, constant and nonthrobbing (7/10).  She then began noticing numbness and tingling of the left cheek, which then spread to involve the entire left side of her face.  No nausea.  Symptoms lasted about 4 hours.  She presented to the ED, where a CT of the head was performed, which was unremarkable.  MRI of brain was performed, which was also normal.  She was evaluated by the stroke team as well.  It was determined that she likely was experiencing a migraine.  She received an Imitrex injection, which helped.  She then woke up on 12/17/12 with left facial numbness but no headache.  This lasted about 45 minutes and then resolved.  Since then, she has not had further headaches or facial numbness.  She denies having any increased stress.  She occasionally has chocolate but does not drink caffeinated beverages.  Sleep is poor due to sleep apnea and currently working on getting a machine such as a CPAP.  She had a period of increased migraine frequency in 2010.  She used imitrex, which helped.  This period lasted about  1 1/2 years and then stopped.  She has never required a preventative medication.  She takes metoprolol for blood pressure.  PAST MEDICAL HISTORY: Past Medical History  Diagnosis Date  . Asthma with COPD with status asthmaticus   . Allergy   . Acne rosacea   . GERD (gastroesophageal reflux disease)   . Diabetes mellitus   . Migraines   . Hot flashes   . Anemia   . Abnormal vaginal Pap smear   . Anxiety   . Dysrhythmia     pvc  . Depression     resolved  . Hypertension      resolved,     dr hochrein  . Sleep apnea     workup still in process    PAST SURGICAL HISTORY: Past Surgical History  Procedure Laterality Date  . Abdominal hysterectomy    . Shoulder arthroscopy  06/08/2011    Procedure: ARTHROSCOPY SHOULDER;  Surgeon: Marin Shutter, MD;  Location: [facility];  Service: Orthopedics;  Laterality: Left;  MUA, LOA, SAD, DCR    MEDICATIONS: Current Outpatient Prescriptions on File Prior to Visit  Medication Sig Dispense Refill  . albuterol (PROVENTIL HFA;VENTOLIN HFA) 108 (90 BASE) MCG/ACT inhaler Inhale 2 puffs into the lungs every 6 (six) hours as needed. For shortness of breath      . aspirin 325 MG tablet Take 325 mg by mouth daily.      Marland Kitchen atorvastatin (LIPITOR) 20 MG tablet Take 20 mg  by mouth daily.      . Calcium-Vitamin D 600-200 MG-UNIT per tablet Take 1 tablet by mouth daily.      Marland Kitchen doxycycline (VIBRA-TABS) 100 MG tablet Take 100 mg by mouth daily.      . ferrous fumarate (HEMOCYTE - 106 MG FE) 325 (106 FE) MG TABS Take 1 tablet by mouth daily.      Marland Kitchen glipiZIDE (GLUCOTROL XL) 10 MG 24 hr tablet Take 10 mg by mouth daily.      Marland Kitchen GLIPIZIDE XL 10 MG 24 hr tablet TAKE ONE TABLET BY MOUTH DAILY  90 tablet  PRN  . GNP Lancets MISC Test bid  200 each  3  . loratadine (CLARITIN) 10 MG tablet Take 10 mg by mouth daily after breakfast. For allergies      . metFORMIN (GLUCOPHAGE) 1000 MG tablet Take 1,000 mg by mouth 2 (two) times daily with a meal.      . metoprolol succinate  (TOPROL-XL) 50 MG 24 hr tablet Take 50 mg by mouth daily. Take with or immediately following a meal.      . mometasone-formoterol (DULERA) 100-5 MCG/ACT AERO Inhale 2 puffs into the lungs 2 (two) times daily.      . multivitamin (THERAGRAN) per tablet Take 1 tablet by mouth daily.        . pioglitazone (ACTOS) 45 MG tablet TAKE 1 TABLET BY MOUTH DAILY  90 tablet  PRN  . sitaGLIPtin (JANUVIA) 100 MG tablet Take 100 mg by mouth daily.      . TRUETEST TEST test strip TEST TWICE DAILY AS NEEDED  200 each  PRN  . levofloxacin (LEVAQUIN) 500 MG tablet Take 1 tablet (500 mg total) by mouth daily.  7 tablet  0   No current facility-administered medications on file prior to visit.    ALLERGIES: Allergies  Allergen Reactions  . Clarithromycin Hives  . Vicodin [Hydrocodone-Acetaminophen] Nausea And Vomiting  . Exenatide Other (See Comments)    Rash & itching at injection site    FAMILY HISTORY: Family History  Problem Relation Age of Onset  . Heart disease Mother 42    CAD  . Asthma Mother   . Asthma Sister   . Colon cancer Neg Hx   . Esophageal cancer Neg Hx   . Rectal cancer Neg Hx   . Stomach cancer Neg Hx     SOCIAL HISTORY: History   Social History  . Marital Status: Divorced    Spouse Name: N/A    Number of Children: N/A  . Years of Education: N/A   Occupational History  . PT [employer]   Social History Main Topics  . Smoking status: Never Smoker   . Smokeless tobacco: Never Used  . Alcohol Use: Yes     Comment: occ  . Drug Use: No  . Sexual Activity: Not on file   Other Topics Concern  . Not on file   Social History Narrative  . No narrative on file    REVIEW OF SYSTEMS: Constitutional: No fevers, chills, or sweats, no generalized fatigue, change in appetite Eyes: No visual changes, double vision, eye pain Ear, nose and throat: No hearing loss, ear pain, nasal congestion, sore throat Cardiovascular: No chest pain, palpitations Respiratory:  No shortness  of breath at rest or with exertion, wheezes GastrointestinaI: No nausea, vomiting, diarrhea, abdominal pain, fecal incontinence Genitourinary:  No dysuria, urinary retention or frequency Musculoskeletal:  No neck pain, back pain Integumentary: No rash, pruritus, skin lesions  Neurological: as above Psychiatric: No depression, insomnia, anxiety Endocrine: No palpitations, fatigue, diaphoresis, mood swings, change in appetite, change in weight, increased thirst Hematologic/Lymphatic:  No anemia, purpura, petechiae. Allergic/Immunologic: no itchy/runny eyes, nasal congestion, recent allergic reactions, rashes  PHYSICAL EXAM: Filed Vitals:   12/31/12 0959  BP: 118/78  Pulse: 62  Temp: 97.8 F (36.6 C)   General: No acute distress Head:  Normocephalic/atraumatic Neck: supple, no paraspinal tenderness, full range of motion Back: No paraspinal tenderness Heart: regular rate and rhythm Lungs: Clear to auscultation bilaterally. Vascular: No carotid bruits. Neurological Exam: Mental status: alert and oriented to person, place, and time, speech fluent and not dysarthric, language intact. Cranial nerves: CN I: not tested CN II: pupils equal, round and reactive to light, visual fields intact, fundi unremarkable. CN III, IV, VI:  full range of motion, no nystagmus, no ptosis CN V: facial sensation intact CN VII: upper and lower face symmetric CN VIII: hearing intact CN IX, X: gag intact, uvula midline CN XI: sternocleidomastoid and trapezius muscles intact CN XII: tongue midline Bulk & Tone: normal, no fasciculations. Motor: 5/5 throughout Sensation: temperature and vibration intact Deep Tendon Reflexes: areflexive, toes down Finger to nose testing: no dysmetria Heel to shin: no dysmetria Gait: normal stance and stride.  Able to walk on toes, heels and in tandem. Romberg negative.  IMPRESSION: Migraine variant  PLAN: 1.  Even though they are not chronic, there is an increased  frequency, so we will start a preventative medication.  Topamax 25mg  qhs x1wk, then increased to 50mg  qhs.  Side effects discussed.  She is already on a beta-blocker.  2.  For acute migraines, continue Advil during the day since it is effective.  If wake up with migraine, try Imitrex.  If that is not effective, then can take an Imitrex with Advil next time she wakes up with migraine. 3.  Follow up in 3 months.  Thank you for allowing me to take part in the care of this patient.  Metta Clines, DO  CC:  Tedra Senegal, MD

## 2012-12-31 NOTE — Patient Instructions (Signed)
Migraine Recommendations: 1.  We will start topiramate (Topamax) 50mg  tablets. Take 1/2 tablet at bedtime for 7 days, then increase to 1 full tablet at bedtime.  Possible side effects include: impaired thinking, sedation, paresthesias (numbness and tingling) and weight loss.  It may cause dehydration and there is a small risk for kidney stones, so make sure to stay hydrated with water during the day.  There is also a very small risk for glaucoma, so if you notice any change in your vision while taking this medication,  2.  Take Advil for headaches that start during the day.  If you wake up with a migraine, try an Imitrex.  If it does not break the headache, then you may take an imitrex with a nonsteroidal antiinflammatory medication (like Advil or Naproxen) the next time you wake up with a migraine. 3.  Limit use of pain relievers to no more than 2 days out of the week.  These medications include acetaminophen, ibuprofen, triptans and narcotics.  This will help reduce risk of rebound headaches. 4.  Stay adequately hydrated. 5.  Maintain good sleep hygiene. 6.  Maintain proper stress management. 7.  Follow up in 3 months.  Call with questions or concerns.

## 2013-01-01 NOTE — Progress Notes (Signed)
Patient was seen on 12/31/12 for the third of a series of three diabetes self-management courses at the [facility]. The following learning objectives were met by the patient during this class:    State the amount of activity recommended for healthy living   Describe activities suitable for individual needs   Identify ways to regularly incorporate activity into daily life   Identify barriers to activity and ways to over come these barriers  Identify diabetes medications being personally used and their primary action for lowering glucose and possible side effects   Describe role of stress on blood glucose and develop strategies to address psychosocial issues   Identify diabetes complications and ways to prevent them  Explain how to manage diabetes during illness   Evaluate success in meeting personal goal   Establish 2-3 goals that they will plan to diligently work on until they return for the free 19-month follow-up visit  Your patient has established the following 4 month goals in their individualized success plan: I will count my carb choices at most meals and snacks I will increase my activity level at least 4 days a week I will test my glucose at least 1-2 times a day, 7 days a week  Your patient has identified these potential barriers to change:  Different work schedule, early days and late days  Your patient has identified their diabetes self-care support plan as  Link to [employer]

## 2013-01-21 ENCOUNTER — Ambulatory Visit (INDEPENDENT_AMBULATORY_CARE_PROVIDER_SITE_OTHER): Payer: 59 | Admitting: Family Medicine

## 2013-01-21 VITALS — BP 122/74 | Wt 254.0 lb

## 2013-01-21 DIAGNOSIS — E119 Type 2 diabetes mellitus without complications: Secondary | ICD-10-CM

## 2013-01-22 NOTE — Progress Notes (Signed)
Patient presents today for 3 month diabetes follow-up as part of the employer-sponsored [employer] program. Current diabetes regimen includes pioglitazone, januvia, glipizide, and atorvastatin. Patient also continues on daily ASA and statin. Most recent MD follow-up was October 2014. Patient has had a few med changes and health changes since previous appt. Patient has completed two courses of prednisone for facial rash. This rash as been diagnosed as rosacea and is being treated with doxycycline and metronidazole cream. Also, migraine headaches have returned, with one instance leading to facial numbness and hospitalization. She has since seen a neurologist and is being treated with topiramate for migraine prevention and sumatriptan for rescue. No changes to diabetes regimen.   Diabetes Assessment: Type of Diabetes: Type 2; Sees Diabetes provider 2 times per year; MD managing Diabetes Dr. Tommie Ard Baxley, endo; checks feet daily; checks blood glucose once daily; uses glucometer; takes medications as prescribed; takes an aspirin a day 325 mg; A1c 7.0 Other Diabetes History: Patient's current regimen includes Metformin 1000 mg twice daily, Januvia 100 mg daily, pioglitazone 45 mg daily, and glipizide ER 10 mg daily. She also continues on daily ASA and statin. Patient did not bring meter today but continues testing once daily, fasting. She would like to begin testing post-prandial and before bedtime on occassion but has not yet committed to this. Fasting glucose ranges 108-140 per patient recall. Patient has had 1-2 episodes of nighttime hypoglycemia due to skipping bedtime snack. These episodes were easily corrected within 15 minutes, but were noted as low as 50s. Patient denies signs or symptoms of foot infection and inspects feet regularly. A1c today was 7.0.  Lifestyle Factors: Exercise - Patient reports she is not exercising as much as she should or would like, but maintains two days per week at [facility].  She continues exercising with her 106+ year old parents and enjoys being able to spend time with them and help them stay active as well.  Diet - Patient admits that she has not made significant change to diet as planned. However, she did attend a 3 part diabetes/nutrition education series at [facility] and found this very informative. Since this time she has started paying close attention to portion sizes and is even measuring them with measuring cups. She would like to continue working on this, especially during the holidays.   Assessment: Patient presents today with improved A1c of 7.0. She has made some small improvements in diet but would like to continue working toward these goals. She continues exercising but has room for improvement in t his area as well. Glucose testing has improved and patient is doing well overall. She will return in 3 months.  Plan: 1) Continue to focus on portion control and maintaining healthy dietary choices 2) Continue exercising, attempt to increase to three days per week 3) Continue testing regularly, consider testing after meals 4) Follow-up in 3 months on Tuesday March 3rd @ 1:30 pm

## 2013-02-25 ENCOUNTER — Ambulatory Visit (INDEPENDENT_AMBULATORY_CARE_PROVIDER_SITE_OTHER): Payer: 59 | Admitting: Internal Medicine

## 2013-02-25 ENCOUNTER — Encounter: Payer: Self-pay | Admitting: Internal Medicine

## 2013-02-25 VITALS — BP 128/88 | HR 76 | Temp 98.2°F | Wt 259.0 lb

## 2013-02-25 DIAGNOSIS — J069 Acute upper respiratory infection, unspecified: Secondary | ICD-10-CM

## 2013-02-25 DIAGNOSIS — J019 Acute sinusitis, unspecified: Secondary | ICD-10-CM

## 2013-02-25 MED ORDER — LEVOFLOXACIN 500 MG PO TABS: 500.0000 mg | ORAL_TABLET | Freq: Every day | ORAL | Status: DC

## 2013-04-02 ENCOUNTER — Ambulatory Visit: Payer: Self-pay | Admitting: Neurology

## 2013-04-10 ENCOUNTER — Ambulatory Visit: Payer: Self-pay | Admitting: Neurology

## 2013-04-30 ENCOUNTER — Ambulatory Visit: Payer: Self-pay | Admitting: *Deleted

## 2013-05-19 ENCOUNTER — Telehealth: Payer: Self-pay | Admitting: Cardiology

## 2013-05-19 NOTE — Telephone Encounter (Signed)
New problem   Pt having decreased heartrate. Please call pt.

## 2013-05-19 NOTE — Telephone Encounter (Signed)
Per pt t/c states over the past several months she has had increased fatigue.  Over the weekend she was very SOB and checked her 02 sat and noted her HR to be around 38 bpm.  The highest it got was 61 bpm after walking up the stairs.  She at times has lightheadedness.  Advised to hold metoprolol and was scheduled to be seen 05/21/2013 with Estella Husk, PA.  Pt states understanding and will call back if further concerns prior to appt.

## 2013-05-21 ENCOUNTER — Ambulatory Visit (INDEPENDENT_AMBULATORY_CARE_PROVIDER_SITE_OTHER): Payer: 59 | Admitting: Physician Assistant

## 2013-05-21 ENCOUNTER — Encounter: Payer: Self-pay | Admitting: Physician Assistant

## 2013-05-21 VITALS — BP 120/70 | HR 80 | Ht 68.0 in | Wt 247.0 lb

## 2013-05-21 DIAGNOSIS — I519 Heart disease, unspecified: Secondary | ICD-10-CM

## 2013-05-21 DIAGNOSIS — I5189 Other ill-defined heart diseases: Secondary | ICD-10-CM

## 2013-05-21 DIAGNOSIS — R001 Bradycardia, unspecified: Secondary | ICD-10-CM | POA: Insufficient documentation

## 2013-05-21 DIAGNOSIS — R079 Chest pain, unspecified: Secondary | ICD-10-CM | POA: Insufficient documentation

## 2013-05-21 DIAGNOSIS — R42 Dizziness and giddiness: Secondary | ICD-10-CM

## 2013-05-21 LAB — CBC WITH DIFFERENTIAL/PLATELET
BASOS PCT: 0.3 % (ref 0.0–3.0)
Basophils Absolute: 0 10*3/uL (ref 0.0–0.1)
EOS PCT: 3.6 % (ref 0.0–5.0)
Eosinophils Absolute: 0.3 10*3/uL (ref 0.0–0.7)
HCT: 34.6 % — ABNORMAL LOW (ref 36.0–46.0)
Hemoglobin: 11.6 g/dL — ABNORMAL LOW (ref 12.0–15.0)
Lymphocytes Relative: 16.6 % (ref 12.0–46.0)
Lymphs Abs: 1.2 10*3/uL (ref 0.7–4.0)
MCHC: 33.5 g/dL (ref 30.0–36.0)
MCV: 88.9 fl (ref 78.0–100.0)
MONO ABS: 0.5 10*3/uL (ref 0.1–1.0)
MONOS PCT: 6.6 % (ref 3.0–12.0)
NEUTROS PCT: 72.9 % (ref 43.0–77.0)
Neutro Abs: 5.4 10*3/uL (ref 1.4–7.7)
Platelets: 193 10*3/uL (ref 150.0–400.0)
RBC: 3.9 Mil/uL (ref 3.87–5.11)
RDW: 14.7 % — ABNORMAL HIGH (ref 11.5–14.6)
WBC: 7.4 10*3/uL (ref 4.5–10.5)

## 2013-05-21 LAB — COMPREHENSIVE METABOLIC PANEL
ALT: 28 U/L (ref 0–35)
AST: 23 U/L (ref 0–37)
Albumin: 3.5 g/dL (ref 3.5–5.2)
Alkaline Phosphatase: 66 U/L (ref 39–117)
BUN: 25 mg/dL — AB (ref 6–23)
CALCIUM: 8.8 mg/dL (ref 8.4–10.5)
CHLORIDE: 106 meq/L (ref 96–112)
CO2: 27 mEq/L (ref 19–32)
Creatinine, Ser: 1.4 mg/dL — ABNORMAL HIGH (ref 0.4–1.2)
GFR: 42.32 mL/min — ABNORMAL LOW (ref >60.00)
GLUCOSE: 140 mg/dL — AB (ref 70–99)
Potassium: 4.2 mEq/L (ref 3.5–5.1)
Sodium: 143 mEq/L (ref 135–145)
Total Bilirubin: 0.7 mg/dL (ref 0.3–1.2)
Total Protein: 6.6 g/dL (ref 6.0–8.3)

## 2013-05-21 LAB — TSH: TSH: 0.56 u[IU]/mL (ref 0.35–5.50)

## 2013-05-21 NOTE — Progress Notes (Signed)
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 He felt she had some element of diastolic dysfunction. She had mild LVH on 2-D echo and stress perfusion imaging was negative for ischemia. CT coronary angiogram couldn't  be done because of ventricular ectopy. She continued to have dyspnea and swelling in her legs, left greater than right. She also has a degree of sleep apnea.  The patient comes in today complaining of dizziness and noticing her heart rates were in the 30s and 40s on her pulse oximetry. She's had worsening dyspnea on exertion and palpitations over the past few weeks. She takes metoprolol 50 mg once daily. She said she checked her heart rate at home over the weekend and it was 38 and 45. She's not sure if it's picking up the PVCs.  The patient also complains of a cramping in her chest that radiates into her back between her shoulder blades. This occurs with exertion and at rest. She says she notices it mostly when she's walking through the hospital and is associated with dyspnea on exertion.The cramping in her chest has been going on for about a month and a half.  Cardiac risk factors include hypertension, diabetes mellitus, hyperlipidemia, and strong family history of coronary artery disease. Her mother had 2 MIs by the age of 43 and has had several stents. She's also had a CVA and diabetes. Her father is diabetic as well and has a heart murmur, her brother has a defibrillator.    Allergies  Allergen Reactions  . Clarithromycin Hives  . Vicodin [Hydrocodone-Acetaminophen] Nausea And Vomiting  . Exenatide Other (See Comments)    Rash & itching at injection site    Current Outpatient Prescriptions on File Prior to Visit: albuterol (PROVENTIL HFA;VENTOLIN HFA) 108 (90 BASE) MCG/ACT inhaler, Inhale 2 puffs into the lungs every 6 (six) hours as needed. For shortness of breath, Disp: , Rfl:  aspirin 325 MG tablet, Take  325 mg by mouth daily., Disp: , Rfl:  atorvastatin (LIPITOR) 20 MG tablet, Take 20 mg by mouth daily., Disp: , Rfl:  Calcium-Vitamin D 600-200 MG-UNIT per tablet, Take 1 tablet by mouth daily., Disp: , Rfl:  ferrous fumarate (HEMOCYTE - 106 MG FE) 325 (106 FE) MG TABS, Take 1 tablet by mouth daily., Disp: , Rfl:  glipiZIDE (GLUCOTROL XL) 10 MG 24 hr tablet, Take 10 mg by mouth daily., Disp: , Rfl:  GLIPIZIDE XL 10 MG 24 hr tablet, TAKE ONE TABLET BY MOUTH DAILY, Disp: 90 tablet, Rfl: PRN GNP Lancets MISC, Test bid, Disp: 200 each, Rfl: 3 levofloxacin (LEVAQUIN) 500 MG tablet, Take 1 tablet (500 mg total) by mouth daily., Disp: 10 tablet, Rfl: 0 loratadine (CLARITIN) 10 MG tablet, Take 10 mg by mouth daily after breakfast. For allergies, Disp: , Rfl:  metFORMIN (GLUCOPHAGE) 1000 MG tablet, Take 1,000 mg by mouth 2 (two) times daily with a meal., Disp: , Rfl:  metoprolol succinate (TOPROL-XL) 50 MG 24 hr tablet, Take 50 mg by mouth daily. Take with or immediately following a meal., Disp: , Rfl:  mometasone-formoterol (DULERA) 100-5 MCG/ACT AERO, Inhale 2 puffs into the lungs 2 (two) times daily., Disp: , Rfl:  multivitamin (THERAGRAN) per tablet, Take 1 tablet by mouth daily.  , Disp: , Rfl:  pioglitazone (ACTOS) 45 MG tablet, TAKE 1 TABLET BY MOUTH DAILY, Disp: 90 tablet, Rfl: PRN sitaGLIPtin (JANUVIA) 100 MG tablet, Take 100 mg by mouth daily., Disp: , Rfl:  SUMAtriptan (IMITREX) 100 MG tablet, Take 1 tablet (100 mg total) by mouth once as needed for migraine or headache. May repeat in 2 hours if headache persists or recurs., Disp: 10 tablet, Rfl: 6 topiramate (TOPAMAX) 50 MG tablet, Take 0.5 tab qhs x7d, then 1tab qhs, Disp: 30 tablet, Rfl: 0 TRUETEST TEST test strip, TEST TWICE DAILY AS NEEDED, Disp: 200 each, Rfl: PRN  No current facility-administered medications on file prior to visit.   Past Medical History:   Asthma with COPD with status asthmaticus                     Allergy                                                       Acne rosacea                                                 GERD (gastroesophageal reflux disease)                       Diabetes mellitus                                            Migraines                                                    Hot flashes                                                  Anemia                                                       Abnormal vaginal Pap smear                                   Anxiety                                                      Dysrhythmia                                                    Comment:pvc   Depression  Comment:resolved   Hypertension                                                   Comment: resolved,     dr hochrein   Sleep apnea                                                    Comment:workup still in process  Past Surgical History:   ABDOMINAL HYSTERECTOMY                                        SHOULDER ARTHROSCOPY                             06/08/2011      Comment:Procedure: ARTHROSCOPY SHOULDER;  Surgeon:               Marin Shutter, MD;  Location: [facility];  Service:              Orthopedics;  Laterality: Left;  MUA, LOA, SAD,              DCR  Review of patient's family history indicates:   Heart disease                  Mother                     Comment: CAD   Asthma                         Mother                   Asthma                         Sister                   Colon cancer                   Neg Hx                   Esophageal cancer              Neg Hx                   Rectal cancer                  Neg Hx                   Stomach cancer                 Neg Hx                   Social History   Marital Status: Divorced            Spouse Name:  Years of Education:                 Number of children:             Occupational History Occupation          Fish farm manager             Comment              PT                  [place] History Main Topics   Smoking Status: Never Smoker                     Smokeless Status: Never Used                       Alcohol Use: Yes               Comment: occ   Drug Use: No             Sexual Activity: Not on file        Other Topics            Concern   None on file  Social History Narrative   None on file    ROS: see history of present illness otherwise negative   PHYSICAL EXAM:  Obese , in no acute distress. Neck: No JVD, HJR, Bruit, or thyroid enlargement  Lungs: No tachypnea, clear without wheezing, rales, or rhonchi  Cardiovascular: RRR with frequent skipping, PMI not displaced, heart sounds normal, no murmurs, gallops, bruit, thrill, or heave.  Abdomen: BS normal. Soft without organomegaly, masses, lesions or tenderness.  Extremities: without cyanosis, clubbing or edema. Good distal pulses bilateral  SKin: Warm, no lesions or rashes   Musculoskeletal: No deformities  Neuro: no focal signs  BP 120/70  Pulse 80  Ht 5\' 8"  (1.727 m)  Wt 247 lb (112.038 kg)  BMI 37.56 kg/m2    EKG: normal sinus rhythm at 80 beats per minute with PVC  2-D echo 03/21/11 Study Conclusions  - Left ventricle: The cavity size was normal. Wall thickness   was increased in a pattern of mild LVH. Systolic function   was normal. The estimated ejection fraction was in the   range of 55% to 60%. There is mild hypokinesis of the   posterolateral myocardium. Doppler parameters are   consistent with abnormal left ventricular relaxation   (grade 1 diastolic dysfunction). - Mitral valve: Mild regurgitation. - Left atrium: The atrium was mildly dilated. Transthoracic echocardiography.  M-mode, complete 2D, spectral Doppler, and color Doppler.  Height:  Height: 172.7cm. Height: 68in.  Weight:  Weight: 106.6kg. Weight: 234.5lb.  Body mass index:  BMI: 35.7kg/m^2.  Body surface area:    BSA: 2.32m^2.  Blood  pressure:     121/90.  Patient status:  Outpatient.  Location:  [facility]  ------------------------------------------------------------  ------------------------------------------------------------ Left ventricle:  The cavity size was normal. Wall thickness was increased in a pattern of mild LVH. Systolic function was normal. The estimated ejection fraction was in the range of 55% to 60%.  Regional wall motion abnormalities:  There is mild hypokinesis of the posterolateral myocardium. Doppler parameters are consistent with abnormal left ventricular relaxation (grade 1 diastolic dysfunction).   Stress nuclear study 03/29/11 Impression Exercise Capacity:  Lexiscan with no exercise. BP Response:  Normal blood pressure response. Clinical Symptoms:  SOB ECG Impression:  No significant ST segment change suggestive of ischemia. Comparison with Prior Nuclear Study: No images to compare  Overall Impression:  Normal stress nuclear study.  Dola Argyle, MD

## 2013-05-21 NOTE — Assessment & Plan Note (Signed)
Patient complains of dizziness it is noticed her heart rate in the 30s and 40s. This is based on pulse oximetry. She does have frequent PVCs. We'll place a 48-hour monitor on her to see what her heart rate is doing. She is on metoprolol 50 mg once daily. Continue for now.

## 2013-05-21 NOTE — Assessment & Plan Note (Signed)
Patient is having symptoms of chest pain with exertion and at rest. She is a diabetic and has multiple cardiac risk factors. She had a normal stress nuclear study in 2013. We will order a stress Myoview

## 2013-05-21 NOTE — Assessment & Plan Note (Addendum)
Patient complains of dizziness and noticed her heart rate in the 30s and 40s. This is based on pulse oximetry. She does have frequent PVCs. We'll place a 48-hour monitor on her to see what her heart rate is doing. She is on metoprolol 50 mg once daily. Continue for now. Also check CMET, CBC & TSH.

## 2013-05-21 NOTE — Assessment & Plan Note (Signed)
Patient has history of diastolic dysfunction. We'll repeat 2-D echo

## 2013-05-21 NOTE — Patient Instructions (Addendum)
Your physician recommends that you schedule a follow-up appointment in: 2 WEEKS WITH DR. [facility]  Your physician has requested that you have en exercise stress myoview. For further information please visit [email or web address]. Please follow instruction sheet, as given.  Your physician has requested that you have an echocardiogram. Echocardiography is a painless test that uses sound waves to create images of your heart. It provides your doctor with information about the size and shape of your heart and how well your heart's chambers and valves are working. This procedure takes approximately one hour. There are no restrictions for this procedure.  Your physician has recommended that you wear a 48 holter monitor. Holter monitors are medical devices that record the heart's electrical activity. Doctors most often use these monitors to diagnose arrhythmias. Arrhythmias are problems with the speed or rhythm of the heartbeat. The monitor is a small, portable device. You can wear one while you do your normal daily activities. This is usually used to diagnose what is causing palpitations/syncope (passing out).  Your physician recommends that you return for lab work in: TODAY CMET, CBC, TSH

## 2013-05-22 ENCOUNTER — Encounter (INDEPENDENT_AMBULATORY_CARE_PROVIDER_SITE_OTHER): Payer: 59

## 2013-05-22 ENCOUNTER — Other Ambulatory Visit: Payer: Self-pay | Admitting: *Deleted

## 2013-05-22 ENCOUNTER — Encounter: Payer: Self-pay | Admitting: *Deleted

## 2013-05-22 ENCOUNTER — Telehealth (HOSPITAL_COMMUNITY): Payer: Self-pay

## 2013-05-22 DIAGNOSIS — I5189 Other ill-defined heart diseases: Secondary | ICD-10-CM

## 2013-05-22 DIAGNOSIS — R42 Dizziness and giddiness: Secondary | ICD-10-CM

## 2013-05-22 NOTE — Progress Notes (Signed)
Patient ID: Samantha Hahn, female   DOB: 04-22-1960, 53 y.o.   MRN: [id number] EVO 48 hour holter monitor applied to patient.

## 2013-05-28 ENCOUNTER — Ambulatory Visit (HOSPITAL_COMMUNITY)
Admission: RE | Admit: 2013-05-28 | Discharge: 2013-05-28 | Disposition: A | Discharge status: Home / Self Care | Payer: 59 | Source: Ambulatory Visit | Attending: Internal Medicine | Admitting: Internal Medicine

## 2013-05-28 DIAGNOSIS — J4489 Other specified chronic obstructive pulmonary disease: Secondary | ICD-10-CM | POA: Insufficient documentation

## 2013-05-28 DIAGNOSIS — R0609 Other forms of dyspnea: Secondary | ICD-10-CM | POA: Insufficient documentation

## 2013-05-28 DIAGNOSIS — Z8249 Family history of ischemic heart disease and other diseases of the circulatory system: Secondary | ICD-10-CM | POA: Insufficient documentation

## 2013-05-28 DIAGNOSIS — J449 Chronic obstructive pulmonary disease, unspecified: Secondary | ICD-10-CM | POA: Insufficient documentation

## 2013-05-28 DIAGNOSIS — R079 Chest pain, unspecified: Secondary | ICD-10-CM

## 2013-05-28 DIAGNOSIS — R5383 Other fatigue: Secondary | ICD-10-CM

## 2013-05-28 DIAGNOSIS — R5381 Other malaise: Secondary | ICD-10-CM | POA: Insufficient documentation

## 2013-05-28 DIAGNOSIS — R002 Palpitations: Secondary | ICD-10-CM | POA: Insufficient documentation

## 2013-05-28 DIAGNOSIS — E663 Overweight: Secondary | ICD-10-CM | POA: Insufficient documentation

## 2013-05-28 DIAGNOSIS — R42 Dizziness and giddiness: Secondary | ICD-10-CM | POA: Insufficient documentation

## 2013-05-28 DIAGNOSIS — I10 Essential (primary) hypertension: Secondary | ICD-10-CM | POA: Insufficient documentation

## 2013-05-28 DIAGNOSIS — R0989 Other specified symptoms and signs involving the circulatory and respiratory systems: Secondary | ICD-10-CM | POA: Insufficient documentation

## 2013-05-28 DIAGNOSIS — I4949 Other premature depolarization: Secondary | ICD-10-CM | POA: Insufficient documentation

## 2013-05-28 DIAGNOSIS — E119 Type 2 diabetes mellitus without complications: Secondary | ICD-10-CM | POA: Insufficient documentation

## 2013-05-28 DIAGNOSIS — I498 Other specified cardiac arrhythmias: Secondary | ICD-10-CM | POA: Insufficient documentation

## 2013-05-28 MED ORDER — TECHNETIUM TC 99M SESTAMIBI GENERIC - CARDIOLITE
10.3000 | Freq: Once | INTRAVENOUS | Status: AC | PRN
  Administered 2013-05-28: 10 via INTRAVENOUS

## 2013-05-28 MED ORDER — TECHNETIUM TC 99M SESTAMIBI GENERIC - CARDIOLITE
29.8000 | Freq: Once | INTRAVENOUS | Status: AC | PRN
  Administered 2013-05-28: 29.8 via INTRAVENOUS

## 2013-05-28 NOTE — Procedures (Addendum)
[facility] [address] [place] [address] [id number]  Cardiology Nuclear Med Study  Samantha Hahn is a 53 y.o. female     MRN : [id number]     DOB: 06-Mar-1960  Procedure Date: 05/28/2013  Nuclear Med Background Indication for Stress Test:  Evaluation for Ischemia History:  Asthma, COPD and Last Stress NUC study on 07/28/2005-nonischemic;non-gated;ECHO on 03/21/2011;EF=55-60%; Lexi mv on 03/29/2011-nonischemic;EF=66% Cardiac Risk Factors: Family History - CAD, Hypertension, Lipids, NIDDM, Overweight and PVC's;Bradycardia  Symptoms:  Chest Pain, Dizziness, DOE, Fatigue, Light-Headedness and Palpitations   Nuclear Pre-Procedure Caffeine/Decaff Intake:  7:00pm NPO After: 5:00am   IV Site: R Forearm  IV 0.9% NS with Angio Cath:  22g  Chest Size (in):  n/a IV Started by: Azucena Cecil, RN  Height: 5\' 8"  (1.727 m)  Cup Size: D  BMI:  Body mass index is 37.56 kg/(m^2). Weight:  247 lb (112.038 kg)   Tech Comments:  n/a    Nuclear Med Study 1 or 2 day study: 1 day  Stress Test Type:  Stress  Order Authorizing Provider:  Minus Breeding, MD   Resting Radionuclide: Technetium 110m Sestamibi  Resting Radionuclide Dose: 10.3 mCi   Stress Radionuclide:  Technetium 69m Sestamibi  Stress Radionuclide Dose: 29.8 mCi           Stress Protocol Rest HR:70 Stress HR: 148  Rest BP: 137/90 Stress BP: 157/84  Exercise Time (min): 4:04 METS: 5.60   Predicted Max HR: 166 bpm % Max HR: 89.16 bpm Rate Pressure Product: 23236  Dose of Adenosine (mg):  n/a Dose of Lexiscan: n/a mg  Dose of Atropine (mg): n/a Dose of Dobutamine: n/a mcg/kg/min (at max HR)  Stress Test Technologist: Mellody Memos, CCT Nuclear Technologist: Imagene Riches, CNMT   Rest Procedure:  Myocardial perfusion imaging was performed at rest 45 minutes following the intravenous administration of Technetium 46m Sestamibi. Stress Procedure:  The patient performed treadmill  exercise using a Bruce  Protocol for 4 minutes and 4 seconds. The patient stopped due to extreme shortness of breath, dizziness and leg fatigue.  Patient denied any chest pain.  There were no significant ST-T wave changes.  Technetium 21m Sestamibi was injected IV at peak exercise and myocardial perfusion imaging was performed after a brief delay.  Transient Ischemic Dilatation (Normal <1.22):  1.02 Lung/Heart Ratio (Normal <0.45):  0.27 QGS EDV:  n/a ml QGS ESV:  n/a ml LV Ejection Fraction: Study not gated  Rest ECG: NSR - Normal EKG  Stress ECG: No significant ST segment change suggestive of ischemia. and There are scattered PVCs.  QPS Raw Data Images:  Normal; no motion artifact; normal heart/lung ratio. Stress Images:  Normal homogeneous uptake in all areas of the myocardium. Rest Images:  Normal homogeneous uptake in all areas of the myocardium. Subtraction (SDS):  No evidence of ischemia.  Impression Exercise Capacity:  Poor exercise capacity. BP Response:  Hypertensive blood pressure response. Clinical Symptoms:  Extreme dyspnea, dizziness ECG Impression:  There are scattered PVCs. Comparison with Prior Nuclear Study: No significant change from previous study  Overall Impression:  Low risk stress nuclear study without reversible ischemia. Poor exercise performance. PVC's noted during exercise..  LV Wall Motion:  Not-gated due to frequent PVC's.  Samantha Casino, MD, [facility] Board Certified in Nuclear Cardiology Attending Cardiologist [place]  Samantha Casino, MD  05/28/2013 12:28 PM

## 2013-05-30 NOTE — Progress Notes (Signed)
Patient informed. Appointment scheduled.

## 2013-06-02 ENCOUNTER — Ambulatory Visit (HOSPITAL_COMMUNITY)
Admission: RE | Admit: 2013-06-02 | Discharge: 2013-06-02 | Disposition: A | Discharge status: Home / Self Care | Payer: 59 | Source: Ambulatory Visit | Attending: Cardiology | Admitting: Cardiology

## 2013-06-02 DIAGNOSIS — I5189 Other ill-defined heart diseases: Secondary | ICD-10-CM

## 2013-06-02 DIAGNOSIS — I4949 Other premature depolarization: Secondary | ICD-10-CM | POA: Insufficient documentation

## 2013-06-02 DIAGNOSIS — I059 Rheumatic mitral valve disease, unspecified: Secondary | ICD-10-CM

## 2013-06-02 NOTE — Progress Notes (Signed)
2D Echo Performed 06/02/2013    Samantha Hahn, RCS

## 2013-06-03 ENCOUNTER — Encounter: Payer: Self-pay | Admitting: Cardiology

## 2013-06-03 ENCOUNTER — Ambulatory Visit (INDEPENDENT_AMBULATORY_CARE_PROVIDER_SITE_OTHER): Payer: 59 | Admitting: Cardiology

## 2013-06-03 VITALS — BP 126/80 | HR 66 | Ht 67.0 in | Wt 250.0 lb

## 2013-06-03 DIAGNOSIS — I10 Essential (primary) hypertension: Secondary | ICD-10-CM

## 2013-06-03 DIAGNOSIS — R0600 Dyspnea, unspecified: Secondary | ICD-10-CM

## 2013-06-03 DIAGNOSIS — R0609 Other forms of dyspnea: Secondary | ICD-10-CM

## 2013-06-03 DIAGNOSIS — R0989 Other specified symptoms and signs involving the circulatory and respiratory systems: Secondary | ICD-10-CM

## 2013-06-03 NOTE — Patient Instructions (Signed)
The current medical regimen is effective;  continue present plan and medications.  Follow up in 6 months with Dr Hochrein.  You will receive a letter in the mail 2 months before you are due.  Please call us when you receive this letter to schedule your follow up appointment.  

## 2013-06-03 NOTE — Progress Notes (Signed)
HPI The patient presents for evaluation of shortness of breath. A pro BNP level which was slightly high. Echocardiogram demonstrated mild LVH with mild MR.   She did have a low risk Lexiscan Myoview without reversible ischemia. The study was not gated because of PVCs.  She did wear a Holter which demonstrated about 10% PVCs with bigeminy.  She returns for follow up of this.    She is mostly concerned because she is noting heart rates in the 40s. However, this likely represents her ventricular bigeminy and we reviewed this. She feels tired with this. However, she does not get syncope or presyncope. She denies any chest pressure, neck or arm discomfort. She has had chronic dyspnea but this is unchanged. She does not have PND or orthopnea. She's not having any new chest pressure, neck or arm discomfort. She's working out occasionally at [employer].  Allergies  Allergen Reactions  . Clarithromycin Hives  . Vicodin [Hydrocodone-Acetaminophen] Nausea And Vomiting  . Exenatide Other (See Comments)    Rash & itching at injection site    Current Outpatient Prescriptions  Medication Sig Dispense Refill  . albuterol (PROVENTIL HFA;VENTOLIN HFA) 108 (90 BASE) MCG/ACT inhaler Inhale 2 puffs into the lungs every 6 (six) hours as needed. For shortness of breath      . aspirin 325 MG tablet Take 325 mg by mouth daily.      Marland Kitchen atorvastatin (LIPITOR) 20 MG tablet Take 20 mg by mouth daily.      Marland Kitchen b complex vitamins tablet Take 1 tablet by mouth daily.      . Calcium-Vitamin D 600-200 MG-UNIT per tablet Take 1 tablet by mouth daily.      Marland Kitchen doxycycline (DORYX) 100 MG EC tablet Take 100 mg by mouth daily.      . ferrous fumarate (HEMOCYTE - 106 MG FE) 325 (106 FE) MG TABS Take 1 tablet by mouth daily.      Marland Kitchen glipiZIDE (GLUCOTROL XL) 10 MG 24 hr tablet Take 10 mg by mouth daily.      . GNP Lancets MISC Test bid  200 each  3  . loratadine (CLARITIN) 10 MG tablet Take 10 mg by mouth daily after breakfast. For  allergies      . metFORMIN (GLUCOPHAGE) 1000 MG tablet Take 1,000 mg by mouth 2 (two) times daily with a meal.      . metoprolol succinate (TOPROL-XL) 50 MG 24 hr tablet Take 50 mg by mouth daily. Take with or immediately following a meal.      . mometasone-formoterol (DULERA) 100-5 MCG/ACT AERO Inhale 2 puffs into the lungs 2 (two) times daily.      . montelukast (SINGULAIR) 10 MG tablet Take 10 mg by mouth every morning.       . multivitamin (THERAGRAN) per tablet Take 1 tablet by mouth daily.        . pioglitazone (ACTOS) 45 MG tablet TAKE 1 TABLET BY MOUTH DAILY  90 tablet  PRN  . sitaGLIPtin (JANUVIA) 100 MG tablet Take 100 mg by mouth daily.      . SUMAtriptan (IMITREX) 100 MG tablet Take 1 tablet (100 mg total) by mouth once as needed for migraine or headache. May repeat in 2 hours if headache persists or recurs.  10 tablet  6  . TRUETEST TEST test strip TEST TWICE DAILY AS NEEDED  200 each  PRN   No current facility-administered medications for this visit.    Past Medical History  Diagnosis  Date  . Asthma with COPD with status asthmaticus   . Allergy   . Acne rosacea   . GERD (gastroesophageal reflux disease)   . Diabetes mellitus   . Migraines   . Hot flashes   . Anemia   . Abnormal vaginal Pap smear   . Anxiety   . Dysrhythmia     pvc  . Depression     resolved  . Hypertension      resolved,     dr Romel Dumond  . Sleep apnea     workup still in process    Past Surgical History  Procedure Laterality Date  . Abdominal hysterectomy    . Shoulder arthroscopy  06/08/2011    Procedure: ARTHROSCOPY SHOULDER;  Surgeon: Marin Shutter, MD;  Location: [facility] [facility];  Service: Orthopedics;  Laterality: Left;  MUA, LOA, SAD, DCR    ROS:  As stated in the HPI and negative for all other systems.  PHYSICAL EXAM BP 126/80  Pulse 66  Ht 5\' 7"  (1.702 m)  Wt 250 lb (113.399 kg)  BMI 39.15 kg/m2 GENERAL:  Well appearing NECK:  No jugular venous distention, waveform within normal limits,  carotid upstroke brisk and symmetric, no bruits, no thyromegaly LUNGS:  Clear to auscultation bilaterally CHEST:  Unremarkable HEART:  PMI not displaced or sustained,S1 and S2 within normal limits, no S3, no S4, no clicks, no rubs, no murmurs ABD:  Flat, positive bowel sounds normal in frequency in pitch, no bruits, no rebound, no guarding, no midline pulsatile mass, no hepatomegaly, no splenomegaly EXT:  2 plus pulses throughout, trace edema, no cyanosis no clubbing   ASSESSMENT AND PLAN  Dyspnea -  There may be some element of diastolic dysfunction. She will continue with conservative therapies. We reviewed this physiology at length.  PVC (premature ventricular contraction) -  She has otherwise had a negative workup. No further testing or therapy is suggested.  She would prefer conservative therapy.    Overweight -  We discussed this at length today.    MR - I will follow this clinically.

## 2013-06-16 NOTE — Progress Notes (Signed)
Patient ID: Samantha Hahn, female   DOB: Jul 31, 1960, 53 y.o.   MRN: [id number] ATTENDING PHYSICIAN NOTE: I have reviewed the chart and agree with the plan as detailed above. Dorcas Mcmurray MD Pager [phone]

## 2013-06-16 NOTE — Progress Notes (Signed)
Patient ID: Samantha Hahn, female   DOB: 12-08-1960, 53 y.o.   MRN: [id number] ATTENDING PHYSICIAN NOTE: I have reviewed the chart and agree with the plan as detailed above. Dorcas Mcmurray MD Pager [phone]

## 2013-06-17 ENCOUNTER — Encounter: Payer: Self-pay | Admitting: Internal Medicine

## 2013-06-17 ENCOUNTER — Ambulatory Visit (INDEPENDENT_AMBULATORY_CARE_PROVIDER_SITE_OTHER): Payer: 59 | Admitting: Internal Medicine

## 2013-06-17 VITALS — BP 120/80 | HR 82 | Temp 98.4°F | Ht 67.0 in | Wt 246.0 lb

## 2013-06-17 DIAGNOSIS — R7989 Other specified abnormal findings of blood chemistry: Secondary | ICD-10-CM

## 2013-06-17 DIAGNOSIS — I10 Essential (primary) hypertension: Secondary | ICD-10-CM

## 2013-06-17 DIAGNOSIS — R799 Abnormal finding of blood chemistry, unspecified: Secondary | ICD-10-CM

## 2013-06-17 DIAGNOSIS — E119 Type 2 diabetes mellitus without complications: Secondary | ICD-10-CM

## 2013-06-17 LAB — BASIC METABOLIC PANEL
BUN: 21 mg/dL (ref 6–23)
CALCIUM: 9.5 mg/dL (ref 8.4–10.5)
CO2: 27 mEq/L (ref 19–32)
CREATININE: 1.29 mg/dL — AB (ref 0.50–1.10)
Chloride: 106 mEq/L (ref 96–112)
Glucose, Bld: 164 mg/dL — ABNORMAL HIGH (ref 70–99)
Potassium: 4.1 mEq/L (ref 3.5–5.3)
Sodium: 141 mEq/L (ref 135–145)

## 2013-06-17 LAB — HEMOGLOBIN A1C
HEMOGLOBIN A1C: 6.9 % — AB (ref <5.7)
MEAN PLASMA GLUCOSE: 151 mg/dL — AB (ref <117)

## 2013-06-17 MED ORDER — LOSARTAN POTASSIUM 25 MG PO TABS: 25.0000 mg | ORAL_TABLET | Freq: Every day | ORAL | Status: DC

## 2013-06-17 NOTE — Patient Instructions (Addendum)
Basic metabolic panel rechecked today because of elevated serum creatinine. Started on Cozaar 25 mg daily. Return 6-8 weeks.

## 2013-06-17 NOTE — Progress Notes (Signed)
   Subjective:    Patient ID: Samantha Hahn, female    DOB: [birth year], 53 y.o.   MRN: [id number]  HPI 53 year old female who recently saw Dr. Percival Spanish for chest pain. Creatinine was found to be elevated at 1.49 and previously was 1.4. She's not on an ACE or an ARB for long-standing history of diabetes. She does not have history of hypertension. She is on oral medication for type 2 diabetes which is fairly well-controlled. Reviewed with her recent C. met with elevated serum creatinine. Estimated GFR 40 cc per minute. Talk with her about need for ARB for renal protection. We can start with low-dose Cozaar 25 mg daily.    Review of Systems     Objective:   Physical Exam  Not examined. Repeat BUN and creatinine drawn today. Spent 15 minutes speaking with patient about these issues.      Assessment & Plan:  Elevated serum creatinine  Type 2 diabetes mellitus-likely causing elevated serum creatinine  Plan: Start Cozaar 25 mg daily. Followup in 6-8 weeks with repeat basic metabolic panel. Basic metabolic panel redrawn today for followup.

## 2013-07-15 ENCOUNTER — Other Ambulatory Visit: Payer: Self-pay | Admitting: Internal Medicine

## 2013-07-21 ENCOUNTER — Other Ambulatory Visit: Payer: Self-pay

## 2013-07-21 ENCOUNTER — Telehealth: Payer: Self-pay | Admitting: Internal Medicine

## 2013-07-21 MED ORDER — PROMETHAZINE HCL 25 MG PO TABS: 25.0000 mg | ORAL_TABLET | Freq: Three times a day (TID) | ORAL | Status: DC | PRN

## 2013-07-21 NOTE — Telephone Encounter (Signed)
Sounds like viral syndrome. We can call in Phenergan. Stay on clear liquids and advance diet slowly.

## 2013-07-21 NOTE — Telephone Encounter (Signed)
Patient reports nausea but no further vomiting. Some loose stool. Fever unknown today. Advised clear liquids and advance diet slowly. Would like Phenergan as Dr. Renold Genta recommended. Will send to [facility] pharmacy.

## 2013-08-11 ENCOUNTER — Other Ambulatory Visit: Payer: Self-pay | Admitting: Internal Medicine

## 2013-08-13 ENCOUNTER — Ambulatory Visit (INDEPENDENT_AMBULATORY_CARE_PROVIDER_SITE_OTHER): Payer: 59 | Admitting: Family Medicine

## 2013-08-13 VITALS — BP 122/78 | Wt 244.0 lb

## 2013-08-13 DIAGNOSIS — E118 Type 2 diabetes mellitus with unspecified complications: Secondary | ICD-10-CM

## 2013-08-14 NOTE — Progress Notes (Signed)
Patient presents today for 3 month diabetes follow-up as part of the employer-sponsored Link to Wellness program. Current diabetes regimen includes pioglitazone, januvia, glipizide, and atorvastatin. Patient also continues on daily ASA and statin. Most recent MD follow-up was April 2015. Patient has had a couple med changes since last visit. Losartan was added for renal protection given elevated SCr. Patient was prescribed losartan 25 mg and is taking it nightly and tolerating well. She will follow-up with MD soon to evaluate this med change and for follow-up labs. Also, patient has discontinued topiramate and has not needed sumatriptan recently. Topiramate was causing intolerable daytime drowsiness. I have cautioned patient against frequent use of IBU, given elevated SCr and addition of ARB. She uses it only on occassion. Patient also had cardiology follow-up in April for PVCs, she was found to be in bigeminy and no changes were made. Patient will follow-up in October. She reports symptoms have resolved and she is having fewer PVCs.   Diabetes Assessment: Type of Diabetes: Type 2; Sees Diabetes provider 2 times per year; MD managing Diabetes Dr. Tommie Ard Baxley, endo; checks feet daily; checks blood glucose once daily; uses glucometer; takes medications as prescribed; takes an aspirin a day 325 mg; A1c 6.29 May 2013 Other Diabetes History: Patient's current regimen includes Metformin 1000 mg twice daily, Januvia 100 mg daily, pioglitazone 45 mg daily, and glipizide ER 10 mg daily. She also continues on daily ASA and statin. Started ARB in April and is tolerating well. Patient did not bring meter and admits that she has not tested as often as she should, she has reduced testing to 1-2 times per week vs daily, always tests fasting. Fasting glucose ranges 100-140 per patient recall. Couple of episodes of hypoglycemia recently, as low as 50s, but easily corrected. Patient denies signs or symptoms of foot  infection and inspects feet regularly. A1c in April was 6.9, did not repeat A1c today.  Lifestyle Factors: Exercise - Patient reports she is not exercising as much as she should or would like, but maintains two days per week at [facility]. She continues exercising with her 56+ year old parents and enjoys being able to spend time with them and help them stay active as well. She is trying to increase to exercisng three times weekly. She exercises for 60 minutes each time and usually does aerobics, weights, etc.  Diet - No major changes to diet but patient continues to attempt to improve dietary habits. She is now eating fewer meals out, and is cooking at home more often. She is working on portion control and feels she does a good job with this. Patient also tries to eat 3 servings of vegetables per day, and two nights per week she cooks just vegetables for supper. Eats a lot of fruit as a snacks.   Assessment: Patient presents today with improved A1c of 6.9 (prev 7.0). She has made some small improvements in diet but would like to continue working toward these goals. She continues exercising twice weekly and is attempting to increase this to three times weekly on a consistent basis. Glucose testing has deteriorated and patient will try to be more consistent with this as well. She will return in 3 months.  Plan: 1) Schedule appt with Dr. Renold Genta for follow-up labwork (recommended 6-8 weeks after April appt) 2) Schedule yearly eye exam 3) Continue making healthy dietary choices 4) Continue regular exericse, goal of three times week 5) Attempt to increase testing, more consistent 6) Follow-up in 3  months on Wednesday Sept 23rd @ 8:30 am Great to see you today, great job with weight loss and improved A1c!!

## 2013-08-18 ENCOUNTER — Other Ambulatory Visit: Payer: Self-pay | Admitting: Internal Medicine

## 2013-08-21 ENCOUNTER — Other Ambulatory Visit: Payer: Self-pay

## 2013-08-21 DIAGNOSIS — Z1231 Encounter for screening mammogram for malignant neoplasm of breast: Secondary | ICD-10-CM

## 2013-08-24 NOTE — Patient Instructions (Signed)
Take Levaquin 500 milligrams daily for 10 days with a meal.

## 2013-08-24 NOTE — Progress Notes (Signed)
   Subjective:    Patient ID: Samantha Hahn, female    DOB: September 01, 1960, 53 y.o.   MRN: [id number]  HPI In today complaining of pressure around eyes, postnasal drip and cough. No documented fever or chills. No shortness of breath or wheezing.     Review of Systems     Objective:   Physical Exam HEENT exam: Pharynx is clear without exudate. TMs slightly full but not red. Neck is supple. Chest clear. Sounds nasally congested.       Assessment & Plan:  Acute sinusitis  Plan: Levaquin 500 milligrams daily for 10 days- take with food

## 2013-09-02 ENCOUNTER — Other Ambulatory Visit: Payer: Self-pay | Admitting: Internal Medicine

## 2013-09-03 NOTE — Telephone Encounter (Signed)
Encounter complete. 

## 2013-09-23 ENCOUNTER — Ambulatory Visit: Payer: Self-pay

## 2013-10-06 ENCOUNTER — Ambulatory Visit: Payer: PRIVATE HEALTH INSURANCE

## 2013-10-06 ENCOUNTER — Other Ambulatory Visit: Payer: Self-pay | Admitting: Occupational Medicine

## 2013-10-06 DIAGNOSIS — R52 Pain, unspecified: Secondary | ICD-10-CM

## 2013-10-23 ENCOUNTER — Encounter: Payer: Self-pay | Admitting: Family Medicine

## 2013-10-23 NOTE — Progress Notes (Signed)
Patient ID: Samantha Hahn, female   DOB: 07-17-1960, 53 y.o.   MRN: [id number] Reviewed: Agree with the documentation and management of our [place].

## 2013-11-04 ENCOUNTER — Other Ambulatory Visit (HOSPITAL_COMMUNITY): Payer: Self-pay | Admitting: Sports Medicine

## 2013-11-04 DIAGNOSIS — M25562 Pain in left knee: Secondary | ICD-10-CM

## 2013-11-13 ENCOUNTER — Encounter: Payer: Self-pay | Admitting: Internal Medicine

## 2013-11-17 ENCOUNTER — Encounter: Payer: Self-pay | Admitting: Internal Medicine

## 2013-11-17 ENCOUNTER — Ambulatory Visit (INDEPENDENT_AMBULATORY_CARE_PROVIDER_SITE_OTHER): Payer: 59 | Admitting: Internal Medicine

## 2013-11-17 VITALS — BP 130/80 | HR 80 | Temp 98.1°F | Ht 67.0 in | Wt 252.0 lb

## 2013-11-17 DIAGNOSIS — J069 Acute upper respiratory infection, unspecified: Secondary | ICD-10-CM

## 2013-11-17 DIAGNOSIS — J9801 Acute bronchospasm: Secondary | ICD-10-CM

## 2013-11-17 DIAGNOSIS — E119 Type 2 diabetes mellitus without complications: Secondary | ICD-10-CM

## 2013-11-17 MED ORDER — PREDNISONE 10 MG PO KIT: PACK | ORAL | Status: DC

## 2013-11-17 MED ORDER — LEVOFLOXACIN 500 MG PO TABS: 500.0000 mg | ORAL_TABLET | Freq: Every day | ORAL | Status: DC

## 2013-11-17 NOTE — Progress Notes (Signed)
   Subjective:    Patient ID: Samantha Hahn, female    DOB: 1960-07-31, 53 y.o.   MRN: [id number]  HPI  Several day history of URI symptoms. Has had cough and wheezing. Wheezing particularly at night despite using albuterol and Symbicort inhalers. Has not been ill since January 2015 when she had sinusitis. She is a diabetic. No fever or shaking chills. Pulse oximetry is 98% on room air. Throat is slightly sore. Ears are itchy.    Review of Systems     Objective:   Physical Exam TMs are slightly full bilaterally but not red. Pharynx is injected without exudate. Neck is supple without adenopathy. Chest clear to auscultation today without rales or wheezing       Assessment & Plan:  Acute URI  Bronchospasm  Plan: Sterapred DS 10 mg 6 day dosepak take as directed in tapering course. Levaquin 500 milligrams by mouth daily for 7 days. Continue albuterol and Symbicort inhalers. Patient finds offer for cough medication. Call if not better in 7 days or sooner if worse.

## 2013-11-17 NOTE — Patient Instructions (Addendum)
Take Levaquin daily for 7 days. Take prednisone 10 mg dosepak in tapering course as directed. Continue to use Symbicort and albuterol inhalers. Call if not better in 7 days or sooner if worse.

## 2013-11-18 ENCOUNTER — Ambulatory Visit (HOSPITAL_COMMUNITY)
Admission: RE | Admit: 2013-11-18 | Discharge: 2013-11-18 | Disposition: A | Discharge status: Home / Self Care | Payer: PRIVATE HEALTH INSURANCE | Source: Ambulatory Visit | Attending: Sports Medicine | Admitting: Sports Medicine

## 2013-11-18 DIAGNOSIS — M25569 Pain in unspecified knee: Secondary | ICD-10-CM | POA: Insufficient documentation

## 2013-11-18 DIAGNOSIS — W19XXXA Unspecified fall, initial encounter: Secondary | ICD-10-CM | POA: Diagnosis not present

## 2013-11-18 DIAGNOSIS — M25562 Pain in left knee: Secondary | ICD-10-CM

## 2013-12-09 ENCOUNTER — Ambulatory Visit (INDEPENDENT_AMBULATORY_CARE_PROVIDER_SITE_OTHER): Payer: Self-pay | Admitting: Family Medicine

## 2013-12-09 VITALS — BP 122/78 | Wt 254.0 lb

## 2013-12-09 DIAGNOSIS — E119 Type 2 diabetes mellitus without complications: Secondary | ICD-10-CM

## 2013-12-09 NOTE — Progress Notes (Signed)
Patient presents today for 3 month diabetes follow-up as part of the employer-sponsored Link to Wellness program. Current diabetes regimen includes metformin, pioglitazone, januvia, and glipizide. Patient also continues on daily ASA and statin. Most recent MD follow-up was April 2015. Losartan was added for renal protection given elevated SCr. Of note, patient fell at work resulting in knee injury. She did not require surgery but activity was restricted to light duty for several weeks. 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 Will f/u with orthopedist soon at which time he will assess, and hopefully release for full activity. Patient reports significant improvement in pain, swelling, and bruising. Patient has also had recent prednisone taper for sinus infection resulting in elevated glucose.  Diabetes Assessment: Type of Diabetes: Type 2; Sees Diabetes provider 2 times per year; MD managing Diabetes Dr. Tommie Ard Baxley, endo; checks feet daily; checks blood glucose once daily; uses glucometer; takes medications as prescribed; takes an aspirin a day 325 mg Other Diabetes History: Patient's current regimen includes Metformin 1000 mg twice daily, Januvia 100 mg daily, pioglitazone 45 mg daily, and glipizide ER 10 mg daily. She also continues on daily ASA, ARB, and statin. Patient did not bring meter and admits that she has not tested as often as she should, she has reduced testing to 1-2 times per week vs daily, always tests fasting or when symptomatic. Fasting glucose ranges 120-140 per patient recall. Patient denies signs or symptoms of foot infection and inspects feet regularly. She does report intermittent "bee sting" sensation in toes. She describes this as sporadic and lasting only seconds in duration. I have asked her to continue to monitor. We did not repeat A1c today as patient will follow-up soon with MD and have A1c checked at  this time. Eye exam pending for November.  Lifestyle Factors: Exercise - No exercise at this time due to recent knee injury and activity restrictions. Patient still has [employer] and would like to resume exercising soon. I have recommended water aerobics or something low impact.  Diet - No major changes to diet but patient continues to attempt to improve dietary habits. She continues cooking at home more often and is working on portion control and feels she does a good job with this. Patient also tries to eat 3 servings of vegetables per day, and two nights per week she cooks just vegetables for supper. Eats a lot of fruit as a snacks.   Assessment: Patient presents today with for LTW follow-up. She has maintained healthy diet but would like to continue working in this area. She has not exercised recently due to knee injury but will resume once released by ortho. Glucose testing continues to be sparse and will try to be more consistent with this as well. She will return in 3 months.  Plan: 1) Schedule appt with Dr. Renold Genta for diabetes follow-up 2) Continue making healthy dietary choices 3) Resume exercise as tolerated by knee, goal once per week 5) Follow-up in 3 months on Tuesday Jan 19th @ 1:30 pm

## 2013-12-11 ENCOUNTER — Ambulatory Visit: Payer: PRIVATE HEALTH INSURANCE

## 2013-12-15 ENCOUNTER — Other Ambulatory Visit: Payer: Self-pay | Admitting: Internal Medicine

## 2013-12-15 ENCOUNTER — Ambulatory Visit: Payer: PRIVATE HEALTH INSURANCE | Attending: Sports Medicine

## 2013-12-15 DIAGNOSIS — M2242 Chondromalacia patellae, left knee: Secondary | ICD-10-CM | POA: Insufficient documentation

## 2013-12-15 DIAGNOSIS — M7502 Adhesive capsulitis of left shoulder: Secondary | ICD-10-CM | POA: Insufficient documentation

## 2013-12-15 DIAGNOSIS — R262 Difficulty in walking, not elsewhere classified: Secondary | ICD-10-CM | POA: Insufficient documentation

## 2013-12-15 DIAGNOSIS — W1830XD Fall on same level, unspecified, subsequent encounter: Secondary | ICD-10-CM | POA: Insufficient documentation

## 2013-12-15 DIAGNOSIS — E119 Type 2 diabetes mellitus without complications: Secondary | ICD-10-CM | POA: Insufficient documentation

## 2013-12-15 DIAGNOSIS — I10 Essential (primary) hypertension: Secondary | ICD-10-CM | POA: Diagnosis not present

## 2013-12-15 DIAGNOSIS — Z5189 Encounter for other specified aftercare: Secondary | ICD-10-CM | POA: Diagnosis present

## 2013-12-16 ENCOUNTER — Ambulatory Visit: Payer: Self-pay

## 2013-12-17 ENCOUNTER — Ambulatory Visit: Payer: 59 | Attending: Internal Medicine | Admitting: Physical Therapy

## 2013-12-17 DIAGNOSIS — M25569 Pain in unspecified knee: Secondary | ICD-10-CM | POA: Insufficient documentation

## 2013-12-17 DIAGNOSIS — R262 Difficulty in walking, not elsewhere classified: Secondary | ICD-10-CM | POA: Diagnosis not present

## 2013-12-17 DIAGNOSIS — M9420 Chondromalacia, unspecified site: Secondary | ICD-10-CM | POA: Insufficient documentation

## 2013-12-23 ENCOUNTER — Ambulatory Visit
Admission: RE | Admit: 2013-12-23 | Discharge: 2013-12-23 | Disposition: A | Discharge status: Home / Self Care | Payer: 59 | Source: Ambulatory Visit

## 2013-12-23 DIAGNOSIS — Z1231 Encounter for screening mammogram for malignant neoplasm of breast: Secondary | ICD-10-CM

## 2013-12-24 ENCOUNTER — Ambulatory Visit: Payer: PRIVATE HEALTH INSURANCE | Attending: Internal Medicine | Admitting: Physical Therapy

## 2013-12-24 DIAGNOSIS — M25569 Pain in unspecified knee: Secondary | ICD-10-CM | POA: Insufficient documentation

## 2013-12-24 DIAGNOSIS — R262 Difficulty in walking, not elsewhere classified: Secondary | ICD-10-CM | POA: Insufficient documentation

## 2013-12-24 DIAGNOSIS — M9420 Chondromalacia, unspecified site: Secondary | ICD-10-CM | POA: Diagnosis present

## 2013-12-24 DIAGNOSIS — M2242 Chondromalacia patellae, left knee: Secondary | ICD-10-CM

## 2013-12-24 NOTE — Therapy (Signed)
Physical Therapy Treatment  Patient Details  Name: Samantha Hahn MRN: [id number] Date of Birth: [birth year]  Encounter Date: 12/24/2013      PT End of Session - 12/24/13 1558    Visit Number 3   Number of Visits 12   Date for PT Re-Evaluation 01/22/14   PT Start Time [id number]   PT Stop Time 1245   PT Time Calculation (min) 47 min   Activity Tolerance Patient tolerated treatment well      Past Medical History  Diagnosis Date  . Asthma with COPD with status asthmaticus   . Allergy   . Acne rosacea   . GERD (gastroesophageal reflux disease)   . Diabetes mellitus   . Migraines   . Hot flashes   . Anemia   . Abnormal vaginal Pap smear   . Anxiety   . Dysrhythmia     pvc  . Depression     resolved  . Hypertension      resolved,     dr hochrein  . Sleep apnea     workup still in process    Past Surgical History  Procedure Laterality Date  . Abdominal hysterectomy    . Shoulder arthroscopy  06/08/2011    Procedure: ARTHROSCOPY SHOULDER;  Surgeon: Marin Shutter, MD;  Location: [place];  Service: Orthopedics;  Laterality: Left;  MUA, LOA, SAD, DCR    There were no vitals taken for this visit.  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   Time 3   Period Weeks   PT SHORT TERM GOAL #3   Title Improve active LT knee flexion to 130 degrees = RT   Time 3   Period Weeks          PT Long Term Goals - 12/24/13 1605    PT LONG TERM GOAL #1   Title Demondtrate and /or verbalize techniques to  reduce the risk of re-injury to include info on :antiinflamatory (Rice method)   Time 6   Period Weeks   Status Achieved   PT LONG TERM GOAL #2   Title Be independent with advanced home exercise program   Time 6   Period Weeks   Status On-going   PT LONG TERM GOAL #3   Title Report pain decrease 50% or more with working full duty in hospital   Time 6   Period Weeks   Status On-going          Plan - 12/24/13 1601    Clinical Impression Statement Function improving, able to return to 10 hours a day.  Edema still a problem but is improving,  adherent with home edema management    PT Plan Remove tape if irritating, patch ends as the roll , measure and check specific goals        Problem List Patient Active Problem List  Diagnosis Date Noted  . Elevated serum creatinine 06/17/2013  . Dizziness 05/21/2013  . Bradycardia 05/21/2013  . Chest pain 05/21/2013  . Adenomatous colon polyp 11/21/2012  . OSA (obstructive sleep apnea) 10/31/2011  . GE reflux 05/22/2011  . Asthma 05/22/2011  . Allergic rhinitis 05/22/2011  . History of bipolar disorder 05/22/2011  . Fatigue 04/27/2011  . PVC (premature ventricular contraction) 04/27/2011  . Overweight 04/27/2011  . Back pain 03/14/2011  . Dyspnea 03/14/2011  . Type II or unspecified type diabetes mellitus without mention of complication, not stated as uncontrolled 09/18/2010  . Depression 09/18/2010  . Migraine headache 09/18/2010  . Hypertension 09/18/2010                                            HARRIS,KAREN 12/24/2013, 4:11 PM

## 2013-12-30 ENCOUNTER — Ambulatory Visit: Payer: PRIVATE HEALTH INSURANCE

## 2013-12-30 DIAGNOSIS — M9420 Chondromalacia, unspecified site: Secondary | ICD-10-CM | POA: Diagnosis not present

## 2013-12-30 DIAGNOSIS — M2242 Chondromalacia patellae, left knee: Secondary | ICD-10-CM

## 2013-12-30 NOTE — Therapy (Signed)
Physical Therapy Treatment  Patient Details  Name: Samantha Hahn MRN: [id number] Date of Birth: May 03, 1960  Encounter Date: 12/30/2013      PT End of Session - 12/30/13 1258    Visit Number 4   Number of Visits 12   PT Start Time [id number]   PT Stop Time 1250   PT Time Calculation (min) 65 min   Activity Tolerance Patient tolerated treatment well   Behavior During Therapy [facility] for tasks assessed/performed      Past Medical History  Diagnosis Date  . Asthma with COPD with status asthmaticus   . Allergy   . Acne rosacea   . GERD (gastroesophageal reflux disease)   . Diabetes mellitus   . Migraines   . Hot flashes   . Anemia   . Abnormal vaginal Pap smear   . Anxiety   . Dysrhythmia     pvc  . Depression     resolved  . Hypertension      resolved,     dr hochrein  . Sleep apnea     workup still in process    Past Surgical History  Procedure Laterality Date  . Abdominal hysterectomy    . Shoulder arthroscopy  06/08/2011    Procedure: ARTHROSCOPY SHOULDER;  Surgeon: Marin Shutter, MD;  Location: [place];  Service: Orthopedics;  Laterality: Left;  MUA, LOA, SAD, DCR    There were no vitals taken for this visit.  Visit Diagnosis:  Chondromalacia of patella, left      Subjective Assessment - 12/30/13 1154    Symptoms Knee feeling good. Have gone  to the [place] to work out on bike and elliptical. Have used the treadmill also   Currently in Pain? Yes   Pain Score 4    Pain Location Knee   Pain Orientation Left;Posterior   Pain Descriptors / Indicators Tightness   Pain Type --  subacute   Pain Onset More than a month ago   Pain Frequency Constant   Aggravating Factors  Working with heavier load   Pain Relieving Factors cold and elevation, medication   Effect of Pain on Daily Activities increase pain with working   Multiple Pain Sites No            [facility] Adult PT Treatment/Exercise - 12/30/13 0001    Knee/Hip Exercises: Aerobic   Stationary Bike L2 10 minutes   Tread Mill trial  at 1 MPH with 15% incline she reported more comfort with walking than with platform flat   Knee/Hip Exercises: Supine   Quad Sets AROM;1 set;20 reps  cued to press into towel roll 5 sec, and lift heel   Knee/Hip Exercises: Sidelying   Hip ABduction Strengthening;AAROM;Right;Left;1 set  8-10 reps with leg ER /IR and neutral,tactile and verbal cue   Clams --  x12 RT and LT with cues for alighment and core stability.    Manual Therapy   Manual Therapy --  kineseotape 2 fans on thigh and 2 fans on calf, 2 y's patell    Hip hingeing single and double leg with verbal ,tactile and demo cues along with practice to pin upper arms to body. .   She is doing better with this      PT Education - 12/30/13 1257    Education provided Yes   Person(s) Educated Patient   Methods Explanation;Demonstration;Verbal cues   Comprehension Verbalized understanding;Returned demonstration          PT Short Term Goals - 12/30/13 1301  PT SHORT TERM GOAL #1   Status Achieved   PT SHORT TERM GOAL #3   Status Achieved            Plan - 12/30/13 1258    Clinical Impression Statement Samantha Hahn completed the session without increased pain. She is improved with hip hingeing exercises.    Pt will benefit from skilled therapeutic intervention in order to improve on the following deficits Increased edema;Pain;Decreased strength   Rehab Potential Good   PT Frequency 2x / week   PT Duration 4 weeks   PT Treatment/Interventions Functional mobility training;Therapeutic exercise;Manual techniques   PT Next Visit Plan Continue stretngth, kineseotape for swelling, hip strengthening   PT Home Exercise Plan SAQ, hip hindging   Consulted and Agree with Plan of Care Patient   PT Plan remove tape if irritating, cont exercise and ifting mechnanics, edema control        Problem List Patient Active Problem List   Diagnosis Date Noted  . Elevated serum creatinine 06/17/2013  . Dizziness 05/21/2013   . Bradycardia 05/21/2013  . Chest pain 05/21/2013  . Adenomatous colon polyp 11/21/2012  . OSA (obstructive sleep apnea) 10/31/2011  . GE reflux 05/22/2011  . Asthma 05/22/2011  . Allergic rhinitis 05/22/2011  . History of bipolar disorder 05/22/2011  . Fatigue 04/27/2011  . PVC (premature ventricular contraction) 04/27/2011  . Overweight 04/27/2011  . Back pain 03/14/2011  . Dyspnea 03/14/2011  . Type II or unspecified type diabetes mellitus without mention of complication, not stated as uncontrolled 09/18/2010  . Depression 09/18/2010  . Migraine headache 09/18/2010  . Hypertension 09/18/2010                                              Darrel Hoover 12/30/2013, 1:04 PM

## 2013-12-30 NOTE — Patient Instructions (Signed)
Asked pt to resume short arc quad sets but with small towel roll to facilitate terminal knee extension.  Reviewed hip hinging exercises single leg and double leg with cues to pin upper arms to chest and for trunk alignment. She was able to demo this correctly.

## 2014-01-01 ENCOUNTER — Ambulatory Visit: Payer: PRIVATE HEALTH INSURANCE

## 2014-01-06 ENCOUNTER — Ambulatory Visit: Payer: PRIVATE HEALTH INSURANCE | Admitting: Physical Therapy

## 2014-01-06 DIAGNOSIS — M9420 Chondromalacia, unspecified site: Secondary | ICD-10-CM | POA: Diagnosis not present

## 2014-01-06 DIAGNOSIS — M2242 Chondromalacia patellae, left knee: Secondary | ICD-10-CM

## 2014-01-06 NOTE — Therapy (Signed)
Physical Therapy Treatment  Patient Details  Name: Samantha Hahn MRN: [id number] Date of Birth: 25-Jun-1960  Encounter Date: 01/06/2014      PT End of Session - 01/06/14 1325    Visit Number 5   Number of Visits 12   Date for PT Re-Evaluation 01/22/14   PT Start Time 1104   PT Stop Time 1150   PT Time Calculation (min) 46 min   Activity Tolerance Patient tolerated treatment well      Past Medical History  Diagnosis Date  . Asthma with COPD with status asthmaticus   . Allergy   . Acne rosacea   . GERD (gastroesophageal reflux disease)   . Diabetes mellitus   . Migraines   . Hot flashes   . Anemia   . Abnormal vaginal Pap smear   . Anxiety   . Dysrhythmia     pvc  . Depression     resolved  . Hypertension      resolved,     dr hochrein  . Sleep apnea     workup still in process    Past Surgical History  Procedure Laterality Date  . Abdominal hysterectomy    . Shoulder arthroscopy  06/08/2011    Procedure: ARTHROSCOPY SHOULDER;  Surgeon: Marin Shutter, MD;  Location: [place];  Service: Orthopedics;  Laterality: Left;  MUA, LOA, SAD, DCR    There were no vitals taken for this visit.  Visit Diagnosis:  Chondromalacia of patella, left      Subjective Assessment - 01/06/14 1110    Symptoms 2/10 now Able to walk more (6 floors) 3/10 at the most over the weekends[facility] Has noticed increased swelling, increased reddness Rt lower leg.   Pain Score 2    Pain Location Knee   Pain Onset More than a month ago   Pain Frequency Intermittent   Aggravating Factors  cold , elevation, ibuprophen 1 tablet daily            [facility] Adult PT Treatment/Exercise - 01/06/14 1118    Lumbar Exercises: Sidelying   Clam 10 reps  red band each side added to home exercise program   Knee/Hip Exercises: Standing   Heel Raises 20 reps  5 second holds   Heel Raises Limitations 10 two feet 5,   Forward Lunges 10 reps  modified, pole used counter blocked, each   Step Down  Left  painful even with modifications 3 reps only   Functional Squat 10 reps   Functional Squat Limitations --  unequal weight shift   SLS 5 rep, knee straight, Bent 5 reps each,33 best strsight,    Other Standing Knee Exercises Terminal knee extension blue band 10 reps   Knee/Hip Exercises: Supine   Quad Sets Left;1 set  5 second holds, cued for technique   Straight Leg Raises Left;1 set;Strengthening  5 second hold   Knee/Hip Exercises: Sidelying   Hip ADduction Left;1 set  5 second holds   Manual Therapy   Manual Therapy Edema management  taping 2 fans of kinesiotex tape medial lateral thigh   Manual Therapy   Other Manual Therapy taping "y" to activate quads (kinesiotex  tape)          PT Education - 01/06/14 1325    Education provided Yes   Person(s) Educated Patient   Methods Explanation;Demonstration   Comprehension Verbalized understanding;Returned demonstration              Problem List Patient  Active Problem List   Diagnosis Date Noted  . Elevated serum creatinine 06/17/2013  . Dizziness 05/21/2013  . Bradycardia 05/21/2013  . Chest pain 05/21/2013  . Adenomatous colon polyp 11/21/2012  . OSA (obstructive sleep apnea) 10/31/2011  . GE reflux 05/22/2011  . Asthma 05/22/2011  . Allergic rhinitis 05/22/2011  . History of bipolar disorder 05/22/2011  . Fatigue 04/27/2011  . PVC (premature ventricular contraction) 04/27/2011  . Overweight 04/27/2011  . Back pain 03/14/2011  . Dyspnea 03/14/2011  . Type II or unspecified type diabetes mellitus without mention of complication, not stated as uncontrolled 09/18/2010  . Depression 09/18/2010  . Migraine headache 09/18/2010  . Hypertension 09/18/2010                                              HARRIS,KAREN PTA 01/06/2014, 1:30 PM

## 2014-01-06 NOTE — Patient Instructions (Addendum)
Add red band to the side lying clam exercises you are already doing.  10 reps.  Call MD to inform him of increased redness Lt lower leg. Per Pearson Forster PT. Consider wearing the  TED hose you have at home for edema control.

## 2014-01-06 NOTE — Progress Notes (Signed)
Patient ID: Samantha Hahn, female   DOB: 06-17-1960, 53 y.o.   MRN: [id number] Reviewed: Agree with the documentation and management of our [place].

## 2014-01-08 ENCOUNTER — Ambulatory Visit: Payer: PRIVATE HEALTH INSURANCE

## 2014-01-08 ENCOUNTER — Telehealth: Payer: Self-pay | Admitting: *Deleted

## 2014-01-08 DIAGNOSIS — M9420 Chondromalacia, unspecified site: Secondary | ICD-10-CM | POA: Diagnosis not present

## 2014-01-08 DIAGNOSIS — M2242 Chondromalacia patellae, left knee: Secondary | ICD-10-CM

## 2014-01-08 NOTE — Therapy (Signed)
Physical Therapy Treatment  Patient Details  Name: Samantha Hahn MRN: [id number] Date of Birth: 1960-05-26  Encounter Date: 01/08/2014      PT End of Session - 01/08/14 1145    Visit Number 6   Number of Visits 12   Date for PT Re-Evaluation 01/22/14   PT Start Time 1100   PT Stop Time 1145   PT Time Calculation (min) 45 min   Activity Tolerance Patient tolerated treatment well   Behavior During Therapy [facility] for tasks assessed/performed      Past Medical History  Diagnosis Date  . Asthma with COPD with status asthmaticus   . Allergy   . Acne rosacea   . GERD (gastroesophageal reflux disease)   . Diabetes mellitus   . Migraines   . Hot flashes   . Anemia   . Abnormal vaginal Pap smear   . Anxiety   . Dysrhythmia     pvc  . Depression     resolved  . Hypertension      resolved,     dr hochrein  . Sleep apnea     workup still in process    Past Surgical History  Procedure Laterality Date  . Abdominal hysterectomy    . Shoulder arthroscopy  06/08/2011    Procedure: ARTHROSCOPY SHOULDER;  Surgeon: Marin Shutter, MD;  Location: [place];  Service: Orthopedics;  Laterality: Left;  MUA, LOA, SAD, DCR    There were no vitals taken for this visit.  Visit Diagnosis:  Chondromalacia of patella, left      Subjective Assessment - 01/08/14 1104    Symptoms No pain today. Saw Dr Delilah Shan and he said to watch skin and use support hose at work.  Less redness after elevation.    Currently in Pain? No/denies   Pain Score 0-No pain   Aggravating Factors  walking full day at work   Pain Relieving Factors ld elevation, meds   Effect of Pain on Daily Activities increase pain with working   Multiple Pain Sites No            [facility] Adult PT Treatment/Exercise - 01/08/14 1106    Knee/Hip Exercises: Aerobic   Stationary Bike L2 10 minutes   Knee/Hip Exercises: Standing   Heel Raises 20 reps;3 seconds  LT leg 10 reps   Side Lunges Limitations X10 with limmted knee flexion   Functional Squat 20 reps  varied height to max proper technique   Knee/Hip Exercises: Supine   Short Arc Quad Sets Strengthening;Left;20 reps  5 sec hold   Straight Leg Raises Left;2 sets;10 reps;AROM  5 sec hold   Knee/Hip Exercises: Sidelying   Hip ABduction Left;10 reps;2 sets   Knee/Hip Exercises: Prone   Hamstring Curl --    25  reps LT 5 pounds   Other Prone Exercises TKE x 15 10 sec hold   Manual Therapy   Manual Therapy --  Kineseotape 2 fans . Bruise noted and avoided Lateral L knee          PT Education - 01/08/14 1144    Education provided Yes   Education Details Reviewed tape and too look for bruising and remove if needed   Methods Explanation   Comprehension Verbalized understanding              Plan - 01/08/14 1146    Clinical Impression Statement No pain post session.    Pt will benefit from skilled therapeutic intervention in order to improve on  the following deficits Increased edema;Pain;Decreased strength   Rehab Potential Good   PT Frequency 2x / week   PT Duration 3 weeks   PT Treatment/Interventions Functional mobility training;Therapeutic exercise;Manual techniques   PT Next Visit Plan Continue stretngth, kineseotape for swelling, hip strengthening   PT Home Exercise Plan SAQ, hip hindging   Consulted and Agree with Plan of Care Patient   PT Plan remove tape if irritating, cont exercise and ifting mechnanics, edema control        Problem List Patient Active Problem List   Diagnosis Date Noted  . Elevated serum creatinine 06/17/2013  . Dizziness 05/21/2013  . Bradycardia 05/21/2013  . Chest pain 05/21/2013  . Adenomatous colon polyp 11/21/2012  . OSA (obstructive sleep apnea) 10/31/2011  . GE reflux 05/22/2011  . Asthma 05/22/2011  . Allergic rhinitis 05/22/2011  . History of bipolar disorder 05/22/2011  . Fatigue 04/27/2011  . PVC (premature ventricular contraction) 04/27/2011  . Overweight 04/27/2011  . Back pain 03/14/2011  .  Dyspnea 03/14/2011  . Type II or unspecified type diabetes mellitus without mention of complication, not stated as uncontrolled 09/18/2010  . Depression 09/18/2010  . Migraine headache 09/18/2010  . Hypertension 09/18/2010                                              Darrel Hoover PT 01/08/2014, 11:48 AM

## 2014-01-08 NOTE — Telephone Encounter (Signed)
appts made and printed...td 

## 2014-01-20 ENCOUNTER — Ambulatory Visit: Payer: PRIVATE HEALTH INSURANCE | Attending: Internal Medicine | Admitting: Physical Therapy

## 2014-01-20 DIAGNOSIS — M9420 Chondromalacia, unspecified site: Secondary | ICD-10-CM | POA: Diagnosis present

## 2014-01-20 DIAGNOSIS — M2242 Chondromalacia patellae, left knee: Secondary | ICD-10-CM

## 2014-01-20 DIAGNOSIS — M25569 Pain in unspecified knee: Secondary | ICD-10-CM | POA: Insufficient documentation

## 2014-01-20 DIAGNOSIS — R262 Difficulty in walking, not elsewhere classified: Secondary | ICD-10-CM | POA: Diagnosis not present

## 2014-01-20 NOTE — Therapy (Signed)
Outpatient Rehabilitation [facility] [address] [place], [place], [address] Phone: [phone]   Fax:  [phone]  Physical Therapy Treatment  Patient Details  Name: Samantha Hahn MRN: [id number] Date of Birth: January 06, 1961  Encounter Date: 01/20/2014    Past Medical History  Diagnosis Date  . Asthma with COPD with status asthmaticus   . Allergy   . Acne rosacea   . GERD (gastroesophageal reflux disease)   . Diabetes mellitus   . Migraines   . Hot flashes   . Anemia   . Abnormal vaginal Pap smear   . Anxiety   . Dysrhythmia     pvc  . Depression     resolved  . Hypertension      resolved,     dr hochrein  . Sleep apnea     workup still in process    Past Surgical History  Procedure Laterality Date  . Abdominal hysterectomy    . Shoulder arthroscopy  06/08/2011    Procedure: ARTHROSCOPY SHOULDER;  Surgeon: Marin Shutter, MD;  Location: [place];  Service: Orthopedics;  Laterality: Left;  MUA, LOA, SAD, DCR    There were no vitals taken for this visit.  Visit Diagnosis:  Chondromalacia of patella, left      Subjective Assessment - 01/20/14 1424    Symptoms No pain. Swelling improved, worse at end of work days. (soreness)  Now released to full duty all floors, Has not had a chance to really test it.   Currently in Pain? Yes   Multiple Pain Sites No            [facility] Adult PT Treatment/Exercise - 01/20/14 1426    Knee/Hip Exercises: Aerobic   Stationary Bike Level 2 10 minutes   Knee/Hip Exercises: Standing   Other Standing Knee Exercises --  Hip hinge. good technique   Other Standing Knee Exercises Terminal knee extension 3 positions, Red band, 10 reps each   Knee/Hip Exercises: Supine   Short Arc Quad Sets AROM;Left;2 sets  1 set0 LBS.  1 set 2 LBS.   Straight Leg Raises Left;2 sets  5 second holds   Knee/Hip Exercises: Sidelying   Hip ABduction 2 sets  1 set 2 lbs   Knee/Hip Exercises: Prone   Hamstring Curl 2 sets;3 seconds   machine.1, 2 legs 30 Lbs.   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 Elevated serum creatinine 06/17/2013  . Dizziness 05/21/2013  . Bradycardia 05/21/2013  . Chest pain 05/21/2013  . Adenomatous colon polyp 11/21/2012  . OSA (obstructive sleep apnea) 10/31/2011  . GE reflux 05/22/2011  . Asthma 05/22/2011  .  Allergic rhinitis 05/22/2011  . History of bipolar disorder 05/22/2011  . Fatigue 04/27/2011  . PVC (premature ventricular contraction) 04/27/2011  . Overweight 04/27/2011  . Back pain 03/14/2011  . Dyspnea 03/14/2011  . Type II or unspecified type diabetes mellitus without mention of complication, not stated as uncontrolled 09/18/2010  . Depression 09/18/2010  . Migraine headache 09/18/2010  . Hypertension 09/18/2010   Melvenia Needles, PTA 01/20/2014 4:22 PM Phone: [phone] Fax: [phone]  Steve Gregg PTA 01/20/2014, 4:22 PM

## 2014-01-22 ENCOUNTER — Ambulatory Visit: Payer: PRIVATE HEALTH INSURANCE

## 2014-01-22 DIAGNOSIS — M9420 Chondromalacia, unspecified site: Secondary | ICD-10-CM | POA: Diagnosis not present

## 2014-01-22 DIAGNOSIS — M2242 Chondromalacia patellae, left knee: Secondary | ICD-10-CM

## 2014-01-22 NOTE — Therapy (Signed)
Outpatient Rehabilitation [facility] [address] [place], [place], [address] Phone: [phone]   Fax:  [phone]  Physical Therapy Treatment  Patient Details  Name: Samantha Hahn MRN: [id number] Date of Birth: 02/28/1960  Encounter Date: 01/22/2014      PT End of Session - 01/22/14 1619    Visit Number 7   Number of Visits 12   Date for PT Re-Evaluation 01/22/14   PT Start Time 9373   PT Stop Time 1615   PT Time Calculation (min) 30 min   Activity Tolerance Patient tolerated treatment well  No pain   Behavior During Therapy [facility] for tasks assessed/performed      Past Medical History  Diagnosis Date  . Asthma with COPD with status asthmaticus   . Allergy   . Acne rosacea   . GERD (gastroesophageal reflux disease)   . Diabetes mellitus   . Migraines   . Hot flashes   . Anemia   . Abnormal vaginal Pap smear   . Anxiety   . Dysrhythmia     pvc  . Depression     resolved  . Hypertension      resolved,     dr hochrein  . Sleep apnea     workup still in process    Past Surgical History  Procedure Laterality Date  . Abdominal hysterectomy    . Shoulder arthroscopy  06/08/2011    Procedure: ARTHROSCOPY SHOULDER;  Surgeon: Marin Shutter, MD;  Location: [place];  Service: Orthopedics;  Laterality: Left;  MUA, LOA, SAD, DCR    There were no vitals taken for this visit.  Visit Diagnosis:  Chondromalacia of patella, left      Subjective Assessment - 01/22/14 1550    Symptoms Feeling good with no knee pain. Less swelling has decreased pain   Multiple Pain Sites No          [facility] PT Assessment - 01/22/14 1552    AROM   Left Knee Extension --  -10 degrees   Left Knee Flexion --  130 degrees   Strength   Left Knee Flexion 5/5   Left Knee Extension 5/5          [facility] Adult PT Treatment/Exercise - 01/22/14 1546    Knee/Hip Exercises: Stretches   Active Hamstring Stretch --  Nustep L5 LE 5 minutes   Knee/Hip Exercises: Aerobic   Stationary Bike --   Knee/Hip Exercises: Standing   Functional Squat --  Reviewed ihip hinge and wall sit with hip flexion    Functional Squat Limitations This was done to promote spine stability and hip flexibility          PT Education - 01/22/14 1619    Education provided Yes   Education Details Wall sit with hip hinge   Person(s) Educated Patient   Methods Explanation;Demonstration;Verbal cues   Comprehension Verbalized understanding;Returned demonstration            PT Long Term Goals - 01/22/14 1622    PT LONG TERM GOAL #1   Title Demondtrate and /or verbalize techniques to reduce the risk of re-injury to include info on :antiinflamatory (Rice method)   Status Achieved   PT LONG TERM GOAL #2   Title Be independent with advanced home exercise program   Status Achieved   PT LONG TERM GOAL #3   Title 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 she reprot greater ease in getting out of chair and with descending stairs   PT Next Visit Plan Discharge today   Consulted and Agree with Plan of Care Patient   PT Plan Discharge to HEP                               Problem List Patient Active Problem List   Diagnosis Date Noted  . Elevated serum creatinine 06/17/2013  . Dizziness 05/21/2013  . Bradycardia 05/21/2013  . Chest pain 05/21/2013  . Adenomatous colon polyp 11/21/2012  . OSA (obstructive sleep apnea) 10/31/2011  . GE reflux 05/22/2011  . Asthma 05/22/2011  . Allergic rhinitis 05/22/2011  . History of bipolar disorder 05/22/2011  . Fatigue 04/27/2011  . PVC (premature ventricular contraction) 04/27/2011  . Overweight 04/27/2011  . Back pain 03/14/2011  . Dyspnea 03/14/2011  . Type II or unspecified type diabetes mellitus without  mention of complication, not stated as uncontrolled 09/18/2010  . Depression 09/18/2010  . Migraine headache 09/18/2010  . Hypertension 09/18/2010    Darrel Hoover PT 01/22/2014, 4:23 PM   PHYSICAL THERAPY DISCHARGE SUMMARY  Visits from Start of Care: 7  Current functional level related to goals / functional outcomes: Independent at work and home with no pain since swelling has subsided   Remaining deficits: None though she understands she needs some caution with work and home activity for now   Education / Equipment: HEP  Plan: Patient agrees to discharge.  Patient goals were met. Patient is being discharged due to meeting the stated rehab goals.  ?????

## 2014-01-22 NOTE — Patient Instructions (Signed)
Modified hip hinge for hip flexibility and spine stability and isometric quads with wall sit. Samantha Hahn was able to perform this correctly. She will add this to HEP

## 2014-01-27 ENCOUNTER — Other Ambulatory Visit: Payer: Self-pay

## 2014-01-27 MED ORDER — GLIPIZIDE ER 10 MG PO TB24: 10.0000 mg | ORAL_TABLET | Freq: Every day | ORAL | Status: DC

## 2014-02-24 ENCOUNTER — Ambulatory Visit: Payer: Self-pay | Admitting: Internal Medicine

## 2014-02-26 ENCOUNTER — Ambulatory Visit: Payer: Self-pay | Admitting: Internal Medicine

## 2014-02-27 ENCOUNTER — Telehealth: Payer: Self-pay | Admitting: *Deleted

## 2014-02-27 NOTE — Telephone Encounter (Signed)
Patient returned call information for FMLA papers reviewed faxed completed paperwork

## 2014-02-27 NOTE — Telephone Encounter (Signed)
Patient called she states she is calling to give verbal consent for [place] to complete and fax her FMLA papers to [employer] they will be faxing them over later today.

## 2014-03-03 ENCOUNTER — Encounter: Payer: Self-pay | Admitting: Internal Medicine

## 2014-03-03 ENCOUNTER — Ambulatory Visit (INDEPENDENT_AMBULATORY_CARE_PROVIDER_SITE_OTHER): Payer: 59 | Admitting: Internal Medicine

## 2014-03-03 VITALS — BP 130/76 | HR 76 | Temp 97.4°F | Wt 249.0 lb

## 2014-03-03 DIAGNOSIS — L719 Rosacea, unspecified: Secondary | ICD-10-CM

## 2014-03-03 DIAGNOSIS — E119 Type 2 diabetes mellitus without complications: Secondary | ICD-10-CM

## 2014-03-03 DIAGNOSIS — Z8709 Personal history of other diseases of the respiratory system: Secondary | ICD-10-CM

## 2014-03-03 DIAGNOSIS — J309 Allergic rhinitis, unspecified: Secondary | ICD-10-CM

## 2014-03-03 DIAGNOSIS — F32A Depression, unspecified: Secondary | ICD-10-CM

## 2014-03-03 DIAGNOSIS — F329 Major depressive disorder, single episode, unspecified: Secondary | ICD-10-CM

## 2014-03-03 DIAGNOSIS — Z8669 Personal history of other diseases of the nervous system and sense organs: Secondary | ICD-10-CM

## 2014-03-03 DIAGNOSIS — I10 Essential (primary) hypertension: Secondary | ICD-10-CM

## 2014-03-03 DIAGNOSIS — G4700 Insomnia, unspecified: Secondary | ICD-10-CM

## 2014-03-03 MED ORDER — DOXYCYCLINE HYCLATE 100 MG PO TBEC: 100.0000 mg | DELAYED_RELEASE_TABLET | Freq: Every day | ORAL | Status: DC

## 2014-03-03 MED ORDER — BUDESONIDE-FORMOTEROL FUMARATE 160-4.5 MCG/ACT IN AERO: 2.0000 | INHALATION_SPRAY | Freq: Two times a day (BID) | RESPIRATORY_TRACT | Status: DC

## 2014-03-10 ENCOUNTER — Encounter: Payer: Self-pay | Admitting: Nurse Practitioner

## 2014-03-10 ENCOUNTER — Ambulatory Visit (INDEPENDENT_AMBULATORY_CARE_PROVIDER_SITE_OTHER): Payer: 59 | Admitting: Nurse Practitioner

## 2014-03-10 VITALS — BP 132/82 | HR 69 | Wt 252.0 lb

## 2014-03-10 DIAGNOSIS — G4700 Insomnia, unspecified: Secondary | ICD-10-CM

## 2014-03-10 DIAGNOSIS — G43109 Migraine with aura, not intractable, without status migrainosus: Secondary | ICD-10-CM

## 2014-03-10 DIAGNOSIS — F329 Major depressive disorder, single episode, unspecified: Secondary | ICD-10-CM

## 2014-03-10 DIAGNOSIS — F32A Depression, unspecified: Secondary | ICD-10-CM

## 2014-03-10 MED ORDER — ESZOPICLONE 2 MG PO TABS: 2.0000 mg | ORAL_TABLET | Freq: Every evening | ORAL | Status: DC | PRN

## 2014-03-10 MED ORDER — PROMETHAZINE HCL 25 MG PO TABS: 25.0000 mg | ORAL_TABLET | Freq: Three times a day (TID) | ORAL | Status: DC | PRN

## 2014-03-10 MED ORDER — HYDROCODONE-ACETAMINOPHEN 5-300 MG PO TABS: 5.0000 mg | ORAL_TABLET | ORAL | Status: DC | PRN

## 2014-03-10 MED ORDER — BUPROPION HCL 100 MG PO TABS: 100.0000 mg | ORAL_TABLET | ORAL | Status: DC

## 2014-03-10 NOTE — Patient Instructions (Signed)

## 2014-03-10 NOTE — Progress Notes (Signed)
Diagnosis: Migraine with aura, insomnia, depression, type 2 diabetes, controlled hypertension, sleep apnea, obesity, elevated cholesterol  History: Samantha Hahn 54 y.o. No obstetric history on file. Presents to [facility] for evaluation of migraine with aura. She has had migraines for over 30 years and her grandmother, mother and daughter all have them. She is not really being treated at this time in part because she has never found anything to be particularly helpful. She has seen a Neurologist who put her on Topamax and the side effects were unacceptable. She has been to the ED and was given Imitrex which did not work well either. She admits to depression that has been lifelong and currently untreated. She was on Zoloft for a time but felt blunted emotionally.  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 She needs FMLA papers filled out today. She also has uncontrolled insomnia. She has had sleep studies and been diagnosed with sleep apnea. She has not been able to get together with insurance and equipment for apnea mask. She takes up to 2 hours to fall asleep and awaken every 1-2 hours.   Location: Bilateral occipital, can move to temples  Number of Headache days/month: 30/30 Severe: 4/ that can last up to 2 days each Moderate: 8 Mild: daily  Current Outpatient Prescriptions on File Prior to Visit  Medication Sig Dispense Refill  . albuterol (PROVENTIL HFA;VENTOLIN HFA) 108 (90 BASE) MCG/ACT inhaler Inhale 2 puffs into the lungs every 6 (six) hours as needed. For shortness of breath    . aspirin 325 MG tablet Take 325 mg by mouth daily.    Marland Kitchen atorvastatin (LIPITOR) 20 MG tablet TAKE 1 TABLET BY MOUTH DAILY. 90 tablet 3  . b complex vitamins tablet Take 1 tablet by mouth daily.    . Beclomethasone Dipropionate (QNASL) 80 MCG/ACT AERS Place 1 application into the nose daily.    . budesonide-formoterol (SYMBICORT) 160-4.5 MCG/ACT inhaler Inhale 2 puffs into  the lungs 2 (two) times daily. 1 Inhaler 11  . Calcium-Vitamin D 600-200 MG-UNIT per tablet Take 1 tablet by mouth daily.    Marland Kitchen doxycycline (DORYX) 100 MG EC tablet Take 1 tablet (100 mg total) by mouth daily. 90 tablet 3  . ferrous fumarate (HEMOCYTE - 106 MG FE) 325 (106 FE) MG TABS Take 1 tablet by mouth daily.    Marland Kitchen glipiZIDE (GLUCOTROL XL) 10 MG 24 hr tablet Take 1 tablet (10 mg total) by mouth daily. 90 tablet 3  . GNP Lancets MISC Test bid 200 each 3  . JANUVIA 100 MG tablet TAKE 1 TABLET BY MOUTH ONCE DAILY 90 tablet 3  . loratadine (CLARITIN) 10 MG tablet Take 10 mg by mouth daily after breakfast. For allergies    . losartan (COZAAR) 25 MG tablet TAKE 1 TABLET BY MOUTH DAILY. 90 tablet 3  . metFORMIN (GLUCOPHAGE) 1000 MG tablet TAKE 1 TABLET BY MOUTH TWICE DAILY 180 tablet 3  . metoprolol succinate (TOPROL-XL) 50 MG 24 hr tablet TAKE 1 TABLET BY MOUTH ONCE DAILY 90 tablet PRN  . montelukast (SINGULAIR) 10 MG tablet Take 10 mg by mouth every morning.     . multivitamin (THERAGRAN) per tablet Take 1 tablet by mouth daily.      . pioglitazone (ACTOS) 45 MG tablet TAKE 1 TABLET BY MOUTH DAILY 90 tablet PRN  . TRUETEST TEST test strip TEST TWICE DAILY AS NEEDED 200 each PRN   No current facility-administered medications on  file prior to visit.    Acute/ prevention: OTC medications, Imitrex, Toprol, Phenergan  Past Medical History  Diagnosis Date  . Asthma with COPD with status asthmaticus   . Allergy   . Acne rosacea   . GERD (gastroesophageal reflux disease)   . Diabetes mellitus   . Migraines   . Hot flashes   . Anemia   . Abnormal vaginal Pap smear   . Anxiety   . Dysrhythmia     pvc  . Depression     resolved  . Hypertension      resolved,     dr hochrein  . Sleep apnea     workup still in process   Past Surgical History  Procedure Laterality Date  . Abdominal hysterectomy    . Shoulder arthroscopy  06/08/2011    Procedure: ARTHROSCOPY SHOULDER;  Surgeon: Marin Shutter, MD;  Location: [place];  Service: Orthopedics;  Laterality: Left;  MUA, LOA, SAD, DCR   Family History  Problem Relation Age of Onset  . Heart disease Mother 56    CAD  . Asthma Mother   . Asthma Sister   . Colon cancer Neg Hx   . Esophageal cancer Neg Hx   . Rectal cancer Neg Hx   . Stomach cancer Neg Hx    Social History:  reports that she has never smoked. She has never used smokeless tobacco. She reports that she drinks alcohol. She reports that she does not use illicit drugs. She works as Community education officer at [employer]: Allergy to [place] is N/V Allergies  Allergen Reactions  . Clarithromycin Hives  . Vicodin [Hydrocodone-Acetaminophen] Nausea And Vomiting  . Exenatide Other (See Comments)    Rash & itching at injection site    Triggers: worsening depression and insomnia  Birth control: age/ hysterectomy  [id number] with aura, insomnia, depression, type 2 diabetes, controlled hypertension, sleep apnea, obesity, elevated cholesterol, asthma  Exam: well developed, obese, caucasian female  General: NAD HEENT: Negative Cardiac: Irregular rhythm  Lungs: clear Neuro: negative Skin:Warm and dry  Impression:migraine - classic Depression Insomnia  Plan: Discussed the pathophysiology of migraine and medication management including risk and benefits. She is advised to never use Imitrex or any Triptan considering her general state of health and cardiac risk factors. Her FMLA papers are filled out and faxed in today. We will treat her depression with Wellbutrin starting low dose 100mg . Will treat insomnia with Lunesta 2 mg. She will be given Vidocen and phenergan to treat severe migraines and she may continue to use OTC medications. Will see her back in one month.    Time Spent:: one hour

## 2014-03-21 ENCOUNTER — Encounter: Payer: Self-pay | Admitting: Internal Medicine

## 2014-03-21 NOTE — Patient Instructions (Signed)
Return in May for physical examination. FMLA form completed. Prescriptions refilled patient's request

## 2014-03-21 NOTE — Progress Notes (Signed)
   Subjective:    Patient ID: Samantha Hahn, female    DOB: 02-27-1960, 54 y.o.   MRN: [id number]  HPI 54 year old White Female in today for medication recheck. History of diabetes mellitus, hypertension, depression, GE reflux, asthma, insomnia, migraine headaches. Needs to have FMLA form completed for work absenteeism for migraines. Feels well. In April hemoglobin A1c  was 6.9%. Needs a number of medications refill. History of rosacea for which she takes doxycycline and this was refilled today. Have refilled Symbicort for asthma. Blood pressure is stable on current regimen. Phenergan refilled for nausea with migraine headache.    Review of Systems     Objective:   Physical Exam  Skin warm and dry. Nodes none. Neck supple without JVD thyromegaly or carotid bruits. Chest clear to auscultation. Cardiac exam regular rate and rhythm. Extremities without edema.      Assessment & Plan:

## 2014-03-31 ENCOUNTER — Encounter: Payer: Self-pay | Admitting: Nurse Practitioner

## 2014-04-14 ENCOUNTER — Encounter: Payer: Self-pay | Admitting: Nurse Practitioner

## 2014-04-14 ENCOUNTER — Ambulatory Visit (INDEPENDENT_AMBULATORY_CARE_PROVIDER_SITE_OTHER): Payer: 59 | Admitting: Nurse Practitioner

## 2014-04-14 VITALS — BP 108/67 | HR 51 | Wt 255.0 lb

## 2014-04-14 DIAGNOSIS — G43109 Migraine with aura, not intractable, without status migrainosus: Secondary | ICD-10-CM

## 2014-04-14 DIAGNOSIS — F32A Depression, unspecified: Secondary | ICD-10-CM

## 2014-04-14 DIAGNOSIS — F329 Major depressive disorder, single episode, unspecified: Secondary | ICD-10-CM

## 2014-04-14 DIAGNOSIS — G4700 Insomnia, unspecified: Secondary | ICD-10-CM

## 2014-04-14 MED ORDER — BUPROPION HCL ER (SR) 150 MG PO TB12: 150.0000 mg | ORAL_TABLET | Freq: Every day | ORAL | Status: DC

## 2014-04-14 MED ORDER — ESZOPICLONE 3 MG PO TABS: 3.0000 mg | ORAL_TABLET | Freq: Every day | ORAL | Status: DC

## 2014-04-14 NOTE — Progress Notes (Signed)
History:  Samantha Hahn is a 54 y.o. No obstetric history on file. who presents to [facility] today for follow up with migraine headaches. She was last seen jan 19/2016. Since then she has started and done well with Wellbutrin 100 mg. She feels 40-50 % better with depression and is willing to slowly push dose upward. She is doing better with Lunesta but doesn't feel like it is strong enough. She is willing to reconnect with Dr Gwenette Greet and work toward an apnea mask. Her migraines are better she is having some headache free days. Severe 4 moderate 8 mild 10. The migraine cocktail is working well for severe migraines. She is interested in migraine study.  The following portions of the patient's history were reviewed and updated as appropriate: allergies, current medications, past family history, past medical history, past social history, past surgical history and problem list.  Review of Systems:  Pertinent items are noted in HPI.  Objective:  Physical Exam BP 108/67 mmHg  Pulse 51  Wt 255 lb (115.667 kg) GENERAL: Well-developed, well-nourished female in no acute distress. Obese HEENT: Normocephalic, atraumatic.  NECK: Supple. Normal thyroid.  LUNGS: . Clear to auscultation bilaterally.  HEART: Regular rate and rhythm with no adventitious sounds. Bradycardia EXTREMITIES: No cyanosis, clubbing, or edema, 2+ distal pulses.   Labs and Imaging No results found.  Assessment & Plan:  Assessment:  Migraine Depression Insomnia  Plans: Will increase Wellbutrin to 150 mg daily Will increase Lunesta to 3 mg Advised to see Dr Gwenette Greet and get apnea mask Consider CGRP migraine study RTC 3 months or prn  Olegario Messier, NP 04/14/2014 3:10 PM

## 2014-04-14 NOTE — Patient Instructions (Signed)

## 2014-06-09 ENCOUNTER — Ambulatory Visit: Payer: Self-pay | Admitting: Pharmacist

## 2014-06-09 ENCOUNTER — Ambulatory Visit (INDEPENDENT_AMBULATORY_CARE_PROVIDER_SITE_OTHER): Payer: Self-pay | Admitting: Family Medicine

## 2014-06-09 ENCOUNTER — Other Ambulatory Visit: Payer: Self-pay | Admitting: Internal Medicine

## 2014-06-09 VITALS — BP 128/74 | Ht 67.0 in | Wt 246.0 lb

## 2014-06-09 DIAGNOSIS — E119 Type 2 diabetes mellitus without complications: Secondary | ICD-10-CM

## 2014-06-09 NOTE — Patient Instructions (Addendum)
1)  Great job with dietary improvements, attempt to eat on a more regular schedule 2)  Maintain exercise at least twice per week, attempt to increase to three times weekly 3)  Continue testing as needed, especially when/if symptomatic 4)  Follow-up in 3 months on July 19th @ 2:30 pm   Great to see you today!

## 2014-06-09 NOTE — Progress Notes (Signed)
Subjective:  Patient presents today for 3 month diabetes follow-up as part of the employer-sponsored Link to Wellness program.  Current diabetes regimen includes glipizide, januvia, pioglitazone, and metformin.  Patient also continues on daily ASA, ARB, and statin.  Most recent MD follow-up was Sept 2015.  Patient has a pending appt for 6 month follow-up.  No med changes or major health changes at this time.    Assessment/Plan:  Patient is a 54 yo female with DM type 2. Most recent A1C was 6.9% which is above goal of less than 6.5%. Weight has decreased by 8 lbs since last visit.  Patient is compliant with medications and is tolerating regimen well.   She did not bring a meter today because she does not test, cannot recall the last time she tested.  She continues to use TrueResult on occasion and we have refilled strips today as her previous supply had expired.  Eye and dental exams are up to date.    Lifestyle:  Physical Activity-  [employer] twice weekly for 30-40 min each time, using treadmill and elliptical.    Nutrition-  Patient feels she has improved diet and is now eating more salads, and making better choices when eating out.  Still cooking at home some and has done well with portion control.  Reports eating fewer treats now, such as icecream, etc.  Paitent does want to attempt to eat on a more regular schedule now.     Goals for Next Visit:  1)  Great job with dietary improvements, attempt to eat on a more regular schedule 2)  Maintain exercise at least twice per week, attempt to increase to three times weekly 3)  Continue testing as needed, especially when/if symptomatic 4)  Follow-up in 3 months on July 19th @ 2:30 pm   Great to see you today!   Tilman Neat, PharmD [facility]

## 2014-06-10 ENCOUNTER — Other Ambulatory Visit: Payer: Self-pay | Admitting: *Deleted

## 2014-06-10 MED ORDER — METRONIDAZOLE 0.75 % EX CREA: TOPICAL_CREAM | Freq: Two times a day (BID) | CUTANEOUS | Status: DC

## 2014-06-10 NOTE — Telephone Encounter (Signed)
Refill on metronidiazole cream sent to pharmacy

## 2014-06-16 NOTE — Progress Notes (Signed)
Patient ID: Samantha Hahn, female   DOB: September 21, 1960, 54 y.o.   MRN: [id number] ATTENDING PHYSICIAN NOTE: I have reviewed the chart and agree with the plan as detailed above. Dorcas Mcmurray MD Pager [phone]

## 2014-06-23 ENCOUNTER — Other Ambulatory Visit: Payer: 59 | Admitting: Internal Medicine

## 2014-06-23 DIAGNOSIS — Z130 Encounter for screening for diseases of the blood and blood-forming organs and certain disorders involving the immune mechanism: Secondary | ICD-10-CM

## 2014-06-23 DIAGNOSIS — Z1329 Encounter for screening for other suspected endocrine disorder: Secondary | ICD-10-CM

## 2014-06-23 DIAGNOSIS — E119 Type 2 diabetes mellitus without complications: Secondary | ICD-10-CM

## 2014-06-23 DIAGNOSIS — Z0000 Encounter for general adult medical examination without abnormal findings: Secondary | ICD-10-CM

## 2014-06-23 DIAGNOSIS — Z13220 Encounter for screening for lipoid disorders: Secondary | ICD-10-CM

## 2014-06-23 DIAGNOSIS — Z1321 Encounter for screening for nutritional disorder: Secondary | ICD-10-CM

## 2014-06-23 LAB — COMPLETE METABOLIC PANEL WITH GFR
ALK PHOS: 65 U/L (ref 39–117)
ALT: 19 U/L (ref 0–35)
AST: 15 U/L (ref 0–37)
Albumin: 3.6 g/dL (ref 3.5–5.2)
BILIRUBIN TOTAL: 0.5 mg/dL (ref 0.2–1.2)
BUN: 28 mg/dL — ABNORMAL HIGH (ref 6–23)
CO2: 23 meq/L (ref 19–32)
CREATININE: 1.46 mg/dL — AB (ref 0.50–1.10)
Calcium: 8.7 mg/dL (ref 8.4–10.5)
Chloride: 106 mEq/L (ref 96–112)
GFR, Est African American: 46 mL/min — ABNORMAL LOW
GFR, Est Non African American: 40 mL/min — ABNORMAL LOW
Glucose, Bld: 169 mg/dL — ABNORMAL HIGH (ref 70–99)
Potassium: 5.1 mEq/L (ref 3.5–5.3)
SODIUM: 139 meq/L (ref 135–145)
Total Protein: 6.1 g/dL (ref 6.0–8.3)

## 2014-06-23 LAB — CBC WITH DIFFERENTIAL/PLATELET
Basophils Absolute: 0.1 10*3/uL (ref 0.0–0.1)
Basophils Relative: 1 % (ref 0–1)
EOS ABS: 0.3 10*3/uL (ref 0.0–0.7)
EOS PCT: 5 % (ref 0–5)
HCT: 34.5 % — ABNORMAL LOW (ref 36.0–46.0)
Hemoglobin: 11 g/dL — ABNORMAL LOW (ref 12.0–15.0)
LYMPHS PCT: 20 % (ref 12–46)
Lymphs Abs: 1.1 10*3/uL (ref 0.7–4.0)
MCH: 29.4 pg (ref 26.0–34.0)
MCHC: 31.9 g/dL (ref 30.0–36.0)
MCV: 92.2 fL (ref 78.0–100.0)
MPV: 11.1 fL (ref 8.6–12.4)
Monocytes Absolute: 0.4 10*3/uL (ref 0.1–1.0)
Monocytes Relative: 7 % (ref 3–12)
Neutro Abs: 3.8 10*3/uL (ref 1.7–7.7)
Neutrophils Relative %: 67 % (ref 43–77)
PLATELETS: 183 10*3/uL (ref 150–400)
RBC: 3.74 MIL/uL — AB (ref 3.87–5.11)
RDW: 14.4 % (ref 11.5–15.5)
WBC: 5.6 10*3/uL (ref 4.0–10.5)

## 2014-06-23 LAB — TSH: TSH: 0.872 u[IU]/mL (ref 0.350–4.500)

## 2014-06-23 LAB — LIPID PANEL
CHOL/HDL RATIO: 3.4 ratio
CHOLESTEROL: 125 mg/dL (ref 0–200)
HDL: 37 mg/dL — AB (ref >=46)
LDL CALC: 59 mg/dL (ref 0–99)
TRIGLYCERIDES: 143 mg/dL (ref <150)
VLDL: 29 mg/dL (ref 0–40)

## 2014-06-24 LAB — VITAMIN D 25 HYDROXY (VIT D DEFICIENCY, FRACTURES): VIT D 25 HYDROXY: 30 ng/mL (ref 30–100)

## 2014-06-24 LAB — HEMOGLOBIN A1C
Hgb A1c MFr Bld: 7.4 % — ABNORMAL HIGH (ref <5.7)
MEAN PLASMA GLUCOSE: 166 mg/dL — AB (ref <117)

## 2014-06-25 ENCOUNTER — Encounter: Payer: 59 | Admitting: Internal Medicine

## 2014-06-30 ENCOUNTER — Ambulatory Visit (INDEPENDENT_AMBULATORY_CARE_PROVIDER_SITE_OTHER): Payer: 59 | Admitting: Internal Medicine

## 2014-06-30 ENCOUNTER — Encounter: Payer: Self-pay | Admitting: Internal Medicine

## 2014-06-30 VITALS — BP 118/80 | HR 54 | Temp 97.9°F | Ht 67.0 in | Wt 245.0 lb

## 2014-06-30 DIAGNOSIS — Z0000 Encounter for general adult medical examination without abnormal findings: Secondary | ICD-10-CM | POA: Diagnosis not present

## 2014-06-30 DIAGNOSIS — I10 Essential (primary) hypertension: Secondary | ICD-10-CM | POA: Diagnosis not present

## 2014-06-30 DIAGNOSIS — E119 Type 2 diabetes mellitus without complications: Secondary | ICD-10-CM

## 2014-06-30 DIAGNOSIS — K219 Gastro-esophageal reflux disease without esophagitis: Secondary | ICD-10-CM | POA: Diagnosis not present

## 2014-06-30 DIAGNOSIS — N183 Chronic kidney disease, stage 3 unspecified: Secondary | ICD-10-CM

## 2014-06-30 DIAGNOSIS — Z8669 Personal history of other diseases of the nervous system and sense organs: Secondary | ICD-10-CM | POA: Diagnosis not present

## 2014-06-30 DIAGNOSIS — J309 Allergic rhinitis, unspecified: Secondary | ICD-10-CM | POA: Diagnosis not present

## 2014-06-30 DIAGNOSIS — Z23 Encounter for immunization: Secondary | ICD-10-CM

## 2014-06-30 DIAGNOSIS — F3170 Bipolar disorder, currently in remission, most recent episode unspecified: Secondary | ICD-10-CM | POA: Diagnosis not present

## 2014-06-30 DIAGNOSIS — G4733 Obstructive sleep apnea (adult) (pediatric): Secondary | ICD-10-CM | POA: Diagnosis not present

## 2014-06-30 DIAGNOSIS — Z8709 Personal history of other diseases of the respiratory system: Secondary | ICD-10-CM | POA: Diagnosis not present

## 2014-06-30 LAB — POCT URINALYSIS DIPSTICK
BILIRUBIN UA: NEGATIVE
Glucose, UA: NEGATIVE
Ketones, UA: NEGATIVE
Leukocytes, UA: NEGATIVE
NITRITE UA: NEGATIVE
PH UA: 5
Protein, UA: NEGATIVE
RBC UA: NEGATIVE
Spec Grav, UA: >=1.03
Urobilinogen, UA: NEGATIVE

## 2014-06-30 NOTE — Progress Notes (Signed)
Subjective:    Patient ID: Samantha Hahn, female    DOB: December 03, 1960, 54 y.o.   MRN: [id number]  HPI  54 year old White Female for health maintenance and evaluation of medical issues. She has a history of migraine headaches, essential hypertension, asthma, depression, insomnia, allergic rhinitis, acne rosacea, controlled type 2 diabetes without complication. History of sleep apnea, bipolar disorder, history of PVCs.  Diabetes is managed by Dr. Elyse Hahn.  Patient had vaginal hysterectomy in 2006. After that she had a consult with gastroenterologist for diarrhea and nausea weight loss and abdominal bloating. Symptoms seemed to start after vaginal hysterectomy. She was treated with Nexium and was given Flagyl for 5 days with improvement.  History of allergic rhinitis with positive allergy skin tests to grasses, weeds, trees, dog and house dust mite. This was done by Dr. Neldon Mc in 2001. She also had spirometry showing small airways disease. Following albuterol, FEV1 did not change significantly. She has been diagnosed with asthma.  History of GE reflux with component of reflux inducing respiratory disease. Has been placed on Pulmicort and Rhinocort aqua as well as Allegra-D and Patanol eyedrops.  She had a cardiac evaluation in 2007 consisting of a nuclear medicine study which was negative and a normal 2-D echocardiogram at that time she had palpitations.  In 2011 she fell at home and had a brief loss of consciousness. Was diagnosed with a minor head injury. CT of the spine and head were normal.  Patient has GYN physician.  Patient had colonoscopy in 2013 and had adenomatous colon polyp. Colonoscopy was done by Dr. Henrene Pastor  Has influenza vaccine through employment.  In 2013 she had adhesive capsulitis of left shoulder and had left shoulder release by Dr. Onnie Graham.  Social history: She is divorced and has a Scientist, water quality. Does not smoke or consume alcohol. She has one adult daughter. She works at  [employer]. 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  Family history: Brother with history of hypertrophic cardiomyopathy. One sister in good health. Parents in good health.  Recent laboratory studies show an elevated serum creatinine. She will be referred to nephrologist for further evaluation. Appears to be developing chronic kidney disease  Review of Systems  Constitutional: Negative for fatigue.       Occasional fatigue  HENT: Negative.   Eyes: Negative.   Respiratory: Negative.   Cardiovascular: Negative.   Gastrointestinal: Negative.   Endocrine: Negative.   Genitourinary: Negative.   Allergic/Immunologic: Positive for environmental allergies.  Neurological:       Headaches improved  Hematological: Negative.        Objective:   Physical Exam  Constitutional: She is oriented to person, place, and time. She appears well-developed and well-nourished. No distress.  HENT:  Head: Normocephalic and atraumatic.  Right Ear: External ear normal.  Left Ear: External ear normal.  Mouth/Throat: Oropharynx is clear and moist. No oropharyngeal exudate.  Eyes: Conjunctivae and EOM are normal. Pupils are equal, round, and reactive to light. Right eye exhibits no discharge. Left eye exhibits no discharge.  Neck: Neck supple. No JVD present. No thyromegaly present.  Cardiovascular: Normal rate, regular rhythm, normal heart sounds and intact distal pulses.   No murmur heard. Pulmonary/Chest: Effort normal and breath sounds normal. No respiratory distress. She has no wheezes. She has no rales.  Breasts normal female  Abdominal: Bowel sounds are normal.  She exhibits no distension. There is no rebound and no guarding.  Genitourinary:  Deferred to GYN  Musculoskeletal: Normal range of motion. She exhibits no edema.  Lymphadenopathy:    She has no cervical adenopathy.  Neurological:  She is alert and oriented to person, place, and time. She has normal reflexes. No cranial nerve deficit. Coordination normal.  Skin: Skin is warm and dry. No rash noted. She is not diaphoretic.  Psychiatric: She has a normal mood and affect. Her behavior is normal. Judgment and thought content normal.  Vitals reviewed.         Assessment & Plan:  Normal health maintenance exam  Essential hypertension-stable  Controlled type 2 diabetes mellitus-followed by Dr. Elyse Hahn  Sleep apnea  History of bipolar disorder-stable  History of migraine headaches  History of asthma-stable  History of allergic rhinitis-stable  GE reflux-stable  History of adenomatous colon polyp  Chronic kidney disease-creatinine is increased from 1.29 1.46. Refer to nephrologist for evaluation. Have ultrasound of kidneys. Likely multifactorial but most likely hypertensive and diabetic related. May have taken NSAIDS for management of migraines.  Plan: Nephrology consult. Return in 6 months. Continue same medications

## 2014-07-01 LAB — MICROALBUMIN / CREATININE URINE RATIO
Creatinine, Urine: 206.3 mg/dL
MICROALB/CREAT RATIO: 19.9 mg/g (ref 0.0–30.0)
Microalb, Ur: 4.1 mg/dL — ABNORMAL HIGH (ref <2.0)

## 2014-07-07 ENCOUNTER — Other Ambulatory Visit: Payer: Self-pay

## 2014-07-15 ENCOUNTER — Ambulatory Visit
Admission: RE | Admit: 2014-07-15 | Discharge: 2014-07-15 | Disposition: A | Discharge status: Home / Self Care | Payer: 59 | Source: Ambulatory Visit | Attending: Internal Medicine | Admitting: Internal Medicine

## 2014-07-15 DIAGNOSIS — N183 Chronic kidney disease, stage 3 unspecified: Secondary | ICD-10-CM

## 2014-07-16 ENCOUNTER — Telehealth: Payer: Self-pay | Admitting: *Deleted

## 2014-07-17 NOTE — Telephone Encounter (Signed)
Reviewed [place] results with patient

## 2014-07-21 ENCOUNTER — Ambulatory Visit (INDEPENDENT_AMBULATORY_CARE_PROVIDER_SITE_OTHER): Payer: 59 | Admitting: Physician Assistant

## 2014-07-21 ENCOUNTER — Encounter: Payer: Self-pay | Admitting: Physician Assistant

## 2014-07-21 VITALS — BP 114/72 | HR 101 | Wt 247.0 lb

## 2014-07-21 DIAGNOSIS — G43109 Migraine with aura, not intractable, without status migrainosus: Secondary | ICD-10-CM

## 2014-07-21 DIAGNOSIS — G4730 Sleep apnea, unspecified: Secondary | ICD-10-CM | POA: Diagnosis not present

## 2014-07-21 DIAGNOSIS — G4700 Insomnia, unspecified: Secondary | ICD-10-CM

## 2014-07-21 MED ORDER — HYDROCODONE-ACETAMINOPHEN 5-300 MG PO TABS: 5.0000 mg | ORAL_TABLET | ORAL | Status: DC | PRN

## 2014-07-21 NOTE — Patient Instructions (Signed)

## 2014-07-21 NOTE — Progress Notes (Signed)
Patient ID: Samantha Hahn, female   DOB: December 06, 1960, 54 y.o.   MRN: [id number] History:  Samantha Hahn is a 54 y.o. No obstetric history on file. who presents to clinic today for headache follow up.  Wellbutrin at 150mg  has been helpful.  Lunesta at 3mg  works better than the 2mg .  She is not using Lunesta every night but more like 2-3 times per week.  She feels like things are going well overall.  She decided against trying the headache study.    Vicodin and phenergan work well for acute headache but she must lie down with it.  Ibuprofen 800mg  is helpful for mild to moderate HA.  Most HA's are left temporal with some left facial numbness.  She has numbness prior to HA and sometimes without the HA.  Worse with movement.  Throbbing, photophobia, phonophobia, nausea and vomitting are associatied. She had root canal for an abscess in her right lower jaw.  Her creatinine levesl are increased and she is starting with a renal specialist soon.  Tomorrow she will see Dr. Gwenette Greet to re-discuss C-PAP.   She denies fever, weakness, SOB, CP.    HIT6: Number of days in the last 4 weeks with:  Severe headache: 3 Moderate headache: 7 Mild headache: 9  No headache: 9  Past Medical History  Diagnosis Date  . Asthma with COPD with status asthmaticus   . Allergy   . Acne rosacea   . GERD (gastroesophageal reflux disease)   . Diabetes mellitus   . Migraines   . Hot flashes   . Anemia   . Abnormal vaginal Pap smear   . Anxiety   . Dysrhythmia     pvc  . Depression     resolved  . Hypertension      resolved,     dr hochrein  . Sleep apnea     workup still in process    History   Social History  . Marital Status: Divorced    Spouse Name: N/A  . Number of Children: N/A  . Years of Education: N/A   Occupational History  . PT [place]   Social History Main Topics  . Smoking status: Never Smoker   . Smokeless tobacco: Never Used  . Alcohol Use: Yes     Comment: occ  . Drug Use: No  . Sexual  Activity: Not on file   Other Topics Concern  . Not on file   Social History Narrative    Family History  Problem Relation Age of Onset  . Heart disease Mother 55    CAD  . Asthma Mother   . Asthma Sister   . Colon cancer Neg Hx   . Esophageal cancer Neg Hx   . Rectal cancer Neg Hx   . Stomach cancer Neg Hx     Allergies  Allergen Reactions  . Clarithromycin Hives  . Vicodin [Hydrocodone-Acetaminophen] Nausea And Vomiting  . Exenatide Other (See Comments)    Rash & itching at injection site    Current Outpatient Prescriptions on File Prior to Visit  Medication Sig Dispense Refill  . albuterol (PROVENTIL HFA;VENTOLIN HFA) 108 (90 BASE) MCG/ACT inhaler Inhale 2 puffs into the lungs every 6 (six) hours as needed. For shortness of breath    . aspirin 325 MG tablet Take 325 mg by mouth daily.    Marland Kitchen atorvastatin (LIPITOR) 20 MG tablet TAKE 1 TABLET BY MOUTH DAILY. 90 tablet 3  . b complex vitamins tablet Take 1  tablet by mouth daily.    . budesonide-formoterol (SYMBICORT) 160-4.5 MCG/ACT inhaler Inhale 2 puffs into the lungs 2 (two) times daily. 1 Inhaler 11  . buPROPion (WELLBUTRIN SR) 150 MG 12 hr tablet Take 1 tablet (150 mg total) by mouth daily. 30 tablet 11  . Calcium-Vitamin D 600-200 MG-UNIT per tablet Take 1 tablet by mouth daily.    Marland Kitchen doxycycline (DORYX) 100 MG EC tablet Take 1 tablet (100 mg total) by mouth daily. 90 tablet 3  . Eszopiclone 3 MG TABS Take 1 tablet (3 mg total) by mouth at bedtime. Take immediately before bedtime 30 tablet 4  . ferrous fumarate (HEMOCYTE - 106 MG FE) 325 (106 FE) MG TABS Take 1 tablet by mouth daily.    Marland Kitchen glipiZIDE (GLUCOTROL XL) 10 MG 24 hr tablet Take 1 tablet (10 mg total) by mouth daily. 90 tablet 3  . Hydrocodone-Acetaminophen 5-300 MG TABS Take 5-300 mg by mouth every 4 (four) hours as needed. 30 each 0  . JANUVIA 100 MG tablet TAKE 1 TABLET BY MOUTH ONCE DAILY 90 tablet 3  . loratadine (CLARITIN) 10 MG tablet Take 10 mg by mouth  daily after breakfast. For allergies    . losartan (COZAAR) 25 MG tablet TAKE 1 TABLET BY MOUTH DAILY. 90 tablet 3  . metFORMIN (GLUCOPHAGE) 1000 MG tablet TAKE 1 TABLET BY MOUTH TWICE DAILY 180 tablet 3  . metoprolol succinate (TOPROL-XL) 50 MG 24 hr tablet TAKE 1 TABLET BY MOUTH ONCE DAILY 90 tablet PRN  . metroNIDAZOLE (METROCREAM) 0.75 % cream Apply topically 2 (two) times daily. To face 45 g prn  . montelukast (SINGULAIR) 10 MG tablet Take 10 mg by mouth every morning.     . multivitamin (THERAGRAN) per tablet Take 1 tablet by mouth daily.      . pioglitazone (ACTOS) 45 MG tablet TAKE 1 TABLET BY MOUTH DAILY 90 tablet PRN  . promethazine (PHENERGAN) 25 MG tablet Take 1 tablet (25 mg total) by mouth every 8 (eight) hours as needed for nausea or vomiting. 30 tablet 3  . QNASL 80 MCG/ACT AERS INHALE 1 PUFF IN EACH NOSTRIL ONCE DAILY FOR STUFFY NOSE OR DRAINAGE 8.7 g 11  . TRUEPLUS LANCETS 30G MISC TEST BLOOD GLUCOSE TWICE DAILY 200 each 3  . TRUETEST TEST test strip TEST TWICE DAILY AS NEEDED 200 each 6   No current facility-administered medications on file prior to visit.     Review of Systems:  All pertinent positive/negative included in HPI, all other review of systems are negative  Objective:  Physical Exam BP 114/72 mmHg  Pulse 101  Wt 247 lb (112.038 kg) CONSTITUTIONAL: Well-developed, well-nourished female in no acute distress.  EYES: EOM intact ENT: Normocephalic CARDIOVASCULAR: Regular rate and rhythm with no adventitious sounds.  RESPIRATORY: Normal rate. Clear to auscultation bilaterally.  ENDOCRINE: Normal thyroid.  MUSCULOSKELETAL: Normal ROM, strength equal bilaterally SKIN: Warm, dry without erythema  NEUROLOGICAL: Alert, oriented, CN II-XII grossly intact, Appropriate balance, Sensation equal bilaterally PSYCH: Normal behavior, mood   Assessment & Plan:  Assessment: 1. Migraine with aura and without status migrainosus, not intractable   2.      Insomnia 3.       Sleep apnea   Plans: Wellbutrin, Lunesta play a role in the prevention of migraine Ibuprofen, Vicoden (despite listed intolerance on allergy list) and Phenergan for acute treatment.   Refills provided for Vicoden only.  All others are already in place.   FMLA to be filled out.  Pt reports this is needed every 6 months.  See Dr. Gwenette Greet for sleep apnea.  CPAP encouraged if this is still recommended.  RTC 6 months or sooner PRN  Paticia Stack, PA-C 07/21/2014 1:38 PM '

## 2014-07-22 ENCOUNTER — Ambulatory Visit: Payer: Self-pay | Admitting: Pulmonary Disease

## 2014-07-28 ENCOUNTER — Encounter: Payer: Self-pay | Admitting: Pharmacist

## 2014-08-31 ENCOUNTER — Other Ambulatory Visit: Payer: Self-pay | Admitting: Internal Medicine

## 2014-09-08 ENCOUNTER — Ambulatory Visit: Payer: Self-pay | Admitting: Pharmacist

## 2014-09-30 ENCOUNTER — Ambulatory Visit: Payer: Self-pay | Admitting: Pharmacist

## 2014-10-06 ENCOUNTER — Other Ambulatory Visit: Payer: Self-pay | Admitting: *Deleted

## 2014-10-06 ENCOUNTER — Other Ambulatory Visit: Payer: Self-pay | Admitting: Internal Medicine

## 2014-10-06 ENCOUNTER — Ambulatory Visit (INDEPENDENT_AMBULATORY_CARE_PROVIDER_SITE_OTHER): Payer: Self-pay | Admitting: Family Medicine

## 2014-10-06 VITALS — BP 110/72 | Wt 246.0 lb

## 2014-10-06 DIAGNOSIS — E118 Type 2 diabetes mellitus with unspecified complications: Secondary | ICD-10-CM

## 2014-10-06 MED ORDER — MONTELUKAST SODIUM 10 MG PO TABS: 10.0000 mg | ORAL_TABLET | Freq: Every morning | ORAL | Status: DC

## 2014-10-06 NOTE — Telephone Encounter (Signed)
singulair refilled.

## 2014-10-06 NOTE — Patient Instructions (Signed)
1)  Great job with dietary improvements, attempt to eat supper earlier, by 6-7 pm 2)  Maintain exercise at least twice per week, attempt to increase to three times weekly 3)  Continue testing as needed, especially when/if symptomatic 4)  Follow-up in 3 months on Tuesday Nov 22nd @ 11:00 am  Great to see you today!

## 2014-10-06 NOTE — Progress Notes (Signed)
Subjective:  Patient presents today for 3 month diabetes follow-up as part of the employer-sponsored Link to Wellness program.  Current diabetes regimen includes glipizide, januvia, pioglitazone, and metformin.  Patient also continues on daily ASA, ARB, and statin.  Most recent MD follow-up was May 2016.  Patient has a pending appt for 6 month follow-up in Nov.  No med changes.  SCr was elevated at most recent PCP visit, referred to renal and has renal appt next week.     Assessment/Plan:  Patient is a 54 yo female with DM type 2. Most recent A1C was 7.1% (prev 6.9%) which is above goal of less than 6.5%. Weight has remained the same since last visit.  Patient is compliant with medications and is tolerating regimen well.   She did not bring a meter today because she does not test regularly.  When she has tested recently fasting has been 140s.  She continues to use TrueResult on occasion and we have refilled strips today as her previous supply had expired.  Eye and dental exams are up to date.    Eye exam was done earlier this month, no diabetic damage noted.  However, pt does have floaters and occasionally experienced a flash of light along with the floaters.  Opthalmologist notes there is a risk of retinal detachement and is watching closely for this.     Lifestyle:  Physical Activity-  No longer using [employer] due to father being sick with facial melanoma.  She spends a lot of time at her parents house caring for them and taking them to doctors appts.  When she is there, she and her mother walk the driveway for 15 minutes twice weekly.  She would like to increase to three times weekly on a consistent basis.     Nutrition-  Patient feels she has improved diet and continues eating more salads, and making better choices when eating out.  She is also managing portion control well.  Reports eating fewer treats now, such as icecream, etc, eats these only 2-3 times weekly vs daily.  Patient admits that  supper is usually fairly late, around 8 pm on average.  She will make attempts to eat earlier in the evening.    Goals for Next Visit:  1)  Great job with dietary improvements, attempt to eat supper earlier, by 6-7 pm 2)  Maintain exercise at least twice per week, attempt to increase to three times weekly 3)  Continue testing as needed, especially when/if symptomatic 4)  Follow-up in 3 months on Tuesday Nov 22nd @ 11:00 am  Great to see you today!   Tilman Neat, PharmD [facility]

## 2014-10-21 ENCOUNTER — Other Ambulatory Visit: Payer: Self-pay | Admitting: Internal Medicine

## 2014-10-28 NOTE — Progress Notes (Signed)
ATTENDING PHYSICIAN NOTE: I have reviewed the chart and agree with the plan as detailed above. Darletta Noblett MD Pager [phone]  

## 2014-12-15 ENCOUNTER — Other Ambulatory Visit: Payer: Self-pay

## 2014-12-15 DIAGNOSIS — Z1231 Encounter for screening mammogram for malignant neoplasm of breast: Secondary | ICD-10-CM

## 2014-12-29 ENCOUNTER — Encounter: Payer: 59 | Admitting: Physician Assistant

## 2015-01-04 ENCOUNTER — Other Ambulatory Visit: Payer: 59 | Admitting: Internal Medicine

## 2015-01-04 DIAGNOSIS — I10 Essential (primary) hypertension: Secondary | ICD-10-CM

## 2015-01-04 DIAGNOSIS — Z79899 Other long term (current) drug therapy: Secondary | ICD-10-CM

## 2015-01-04 DIAGNOSIS — E119 Type 2 diabetes mellitus without complications: Secondary | ICD-10-CM

## 2015-01-04 LAB — LIPID PANEL
CHOL/HDL RATIO: 3.2 ratio (ref <=5.0)
Cholesterol: 120 mg/dL — ABNORMAL LOW (ref 125–200)
HDL: 38 mg/dL — AB (ref >=46)
LDL CALC: 56 mg/dL (ref <130)
TRIGLYCERIDES: 130 mg/dL (ref <150)
VLDL: 26 mg/dL (ref <30)

## 2015-01-04 LAB — HEPATIC FUNCTION PANEL
ALBUMIN: 3.6 g/dL (ref 3.6–5.1)
ALK PHOS: 84 U/L (ref 33–130)
ALT: 14 U/L (ref 6–29)
AST: 17 U/L (ref 10–35)
BILIRUBIN TOTAL: 0.5 mg/dL (ref 0.2–1.2)
Bilirubin, Direct: 0.1 mg/dL (ref <=0.2)
Indirect Bilirubin: 0.4 mg/dL (ref 0.2–1.2)
TOTAL PROTEIN: 6.3 g/dL (ref 6.1–8.1)

## 2015-01-04 LAB — HEMOGLOBIN A1C
HEMOGLOBIN A1C: 7 % — AB (ref <5.7)
MEAN PLASMA GLUCOSE: 154 mg/dL — AB (ref <117)

## 2015-01-05 ENCOUNTER — Ambulatory Visit (INDEPENDENT_AMBULATORY_CARE_PROVIDER_SITE_OTHER): Payer: 59 | Admitting: Physician Assistant

## 2015-01-05 ENCOUNTER — Ambulatory Visit: Payer: 59 | Admitting: Internal Medicine

## 2015-01-05 ENCOUNTER — Encounter: Payer: Self-pay | Admitting: Physician Assistant

## 2015-01-05 VITALS — BP 117/84 | HR 76 | Ht 67.0 in | Wt 247.0 lb

## 2015-01-05 DIAGNOSIS — N183 Chronic kidney disease, stage 3 (moderate): Secondary | ICD-10-CM

## 2015-01-05 DIAGNOSIS — G4700 Insomnia, unspecified: Secondary | ICD-10-CM

## 2015-01-05 DIAGNOSIS — G43109 Migraine with aura, not intractable, without status migrainosus: Secondary | ICD-10-CM

## 2015-01-05 DIAGNOSIS — F329 Major depressive disorder, single episode, unspecified: Secondary | ICD-10-CM | POA: Diagnosis not present

## 2015-01-05 DIAGNOSIS — N189 Chronic kidney disease, unspecified: Secondary | ICD-10-CM | POA: Insufficient documentation

## 2015-01-05 DIAGNOSIS — F32A Depression, unspecified: Secondary | ICD-10-CM

## 2015-01-05 LAB — MICROALBUMIN, URINE: MICROALB UR: 0.2 mg/dL

## 2015-01-05 MED ORDER — GABAPENTIN 100 MG PO CAPS: 100.0000 mg | ORAL_CAPSULE | Freq: Every day | ORAL | Status: DC

## 2015-01-05 MED ORDER — ESZOPICLONE 3 MG PO TABS: 3.0000 mg | ORAL_TABLET | Freq: Every day | ORAL | Status: DC

## 2015-01-05 MED ORDER — BUPROPION HCL ER (SR) 150 MG PO TB12: 150.0000 mg | ORAL_TABLET | Freq: Every day | ORAL | Status: DC

## 2015-01-05 MED ORDER — HYDROCODONE-ACETAMINOPHEN 5-300 MG PO TABS: 5.0000 mg | ORAL_TABLET | ORAL | Status: DC | PRN

## 2015-01-05 NOTE — Progress Notes (Signed)
Patient ID: Samantha Hahn, female   DOB: 09-09-1960, 54 y.o.   MRN: [id number] History:  Samantha Hahn is a 54 y.o.  who presents to clinic today for f/u of headaches.  She has a HA today but feels her HAs are overall unchanged. . Her kidney functioning has worsened (likely diabetes related CKD level 3) and she was told she could use no more ibuprofen.  She formerly took Ibuprofen with vicodin for HA relief.  Pt is stressed as her mom had a couple strokes since last seen.  Pt is now having to provide increased care for her mom.  Her own headaches are continuing only her medication for aborting the HA is now less effective.  There have been no quality changes in the HA.  She still has HA nearly every day at some point.  She notes stress plays a role and stress is definitely increased lately.  She use phenergan to combat that nausea related to vicodin use but generally does not have nausea with HA only.    HIT6:61 Number of days in the last 4 weeks with:  Severe headache: 4 Moderate headache: 8 Mild headache: 8  No headache: 8   Past Medical History  Diagnosis Date  . Asthma with COPD with status asthmaticus ([place])   . Allergy   . Acne rosacea   . GERD (gastroesophageal reflux disease)   . Diabetes mellitus   . Migraines   . Hot flashes   . Anemia   . Abnormal vaginal Pap smear   . Anxiety   . Dysrhythmia     pvc  . Depression     resolved  . Hypertension      resolved,     dr hochrein  . Sleep apnea     workup still in process    Social History   Social History  . Marital Status: Divorced    Spouse Name: N/A  . Number of Children: N/A  . Years of Education: N/A   Occupational History  . PT [place]   Social History Main Topics  . Smoking status: Never Smoker   . Smokeless tobacco: Never Used  . Alcohol Use: Yes     Comment: occ  . Drug Use: No  . Sexual Activity: Not Currently    Birth Control/ Protection: Surgical   Other Topics Concern  . Not on file   Social  History Narrative    Family History  Problem Relation Age of Onset  . Heart disease Mother 23    CAD  . Asthma Mother   . Asthma Sister   . Colon cancer Neg Hx   . Esophageal cancer Neg Hx   . Rectal cancer Neg Hx   . Stomach cancer Neg Hx     Allergies  Allergen Reactions  . Clarithromycin Hives  . Vicodin [Hydrocodone-Acetaminophen] Nausea And Vomiting  . Exenatide Other (See Comments)    Rash & itching at injection site    Current Outpatient Prescriptions on File Prior to Visit  Medication Sig Dispense Refill  . albuterol (PROVENTIL HFA;VENTOLIN HFA) 108 (90 BASE) MCG/ACT inhaler Inhale 2 puffs into the lungs every 6 (six) hours as needed. For shortness of breath    . aspirin 325 MG tablet Take 325 mg by mouth daily.    Marland Kitchen atorvastatin (LIPITOR) 20 MG tablet TAKE 1 TABLET BY MOUTH DAILY. 90 tablet 3  . b complex vitamins tablet Take 1 tablet by mouth daily.    . budesonide-formoterol (  SYMBICORT) 160-4.5 MCG/ACT inhaler Inhale 2 puffs into the lungs 2 (two) times daily. 1 Inhaler 11  . buPROPion (WELLBUTRIN SR) 150 MG 12 hr tablet Take 1 tablet (150 mg total) by mouth daily. 30 tablet 11  . Calcium-Vitamin D 600-200 MG-UNIT per tablet Take 1 tablet by mouth daily.    Marland Kitchen doxycycline (DORYX) 100 MG EC tablet Take 1 tablet (100 mg total) by mouth daily. 90 tablet 3  . Eszopiclone 3 MG TABS Take 1 tablet (3 mg total) by mouth at bedtime. Take immediately before bedtime 30 tablet 4  . ferrous fumarate (HEMOCYTE - 106 MG FE) 325 (106 FE) MG TABS Take 1 tablet by mouth daily.    Marland Kitchen glipiZIDE (GLUCOTROL XL) 10 MG 24 hr tablet Take 1 tablet (10 mg total) by mouth daily. 90 tablet 3  . Hydrocodone-Acetaminophen 5-300 MG TABS Take 5-300 mg by mouth every 4 (four) hours as needed. 30 each 0  . JANUVIA 100 MG tablet TAKE 1 TABLET BY MOUTH ONCE DAILY 90 tablet 3  . loratadine (CLARITIN) 10 MG tablet Take 10 mg by mouth daily after breakfast. For allergies    . losartan (COZAAR) 25 MG tablet  TAKE 1 TABLET BY MOUTH DAILY. 90 tablet 3  . metFORMIN (GLUCOPHAGE) 1000 MG tablet TAKE 1 TABLET BY MOUTH TWICE DAILY 180 tablet PRN  . metoprolol succinate (TOPROL-XL) 50 MG 24 hr tablet TAKE 1 TABLET BY MOUTH ONCE DAILY 90 tablet PRN  . metroNIDAZOLE (METROCREAM) 0.75 % cream Apply topically 2 (two) times daily. To face 45 g prn  . montelukast (SINGULAIR) 10 MG tablet Take 1 tablet (10 mg total) by mouth every morning. 90 tablet 1  . multivitamin (THERAGRAN) per tablet Take 1 tablet by mouth daily.      . pioglitazone (ACTOS) 45 MG tablet TAKE 1 TABLET BY MOUTH ONCE DAILY 90 tablet 2  . promethazine (PHENERGAN) 25 MG tablet Take 1 tablet (25 mg total) by mouth every 8 (eight) hours as needed for nausea or vomiting. 30 tablet 3  . QNASL 80 MCG/ACT AERS INHALE 1 PUFF IN EACH NOSTRIL ONCE DAILY FOR STUFFY NOSE OR DRAINAGE 8.7 g 11  . TRUEPLUS LANCETS 30G MISC TEST BLOOD GLUCOSE TWICE DAILY 200 each 3  . TRUETEST TEST test strip TEST TWICE DAILY AS NEEDED 200 each 6   No current facility-administered medications on file prior to visit.     Review of Systems:  All pertinent positive/negative included in HPI, all other review of systems are negative  Objective:  Physical Exam BP 117/84 mmHg  Pulse 76  Ht 5\' 7"  (1.702 m)  Wt 247 lb (112.038 kg)  BMI 38.68 kg/m2 CONSTITUTIONAL: Well-developed, well-nourished female in no acute distress.  EYES: EOM intact ENT: Normocephalic CARDIOVASCULAR: Regular rate and rhythm with no adventitious sounds.  RESPIRATORY: Normal rate. Clear to auscultation bilaterally.  ENDOCRINE: Normal thyroid.  MUSCULOSKELETAL: Normal ROM, strength equal bilaterally, notable muscle spasm within the paraspinal muscles of cervical region and trapezius Bilaterally. R>L   SKIN: Warm, dry without erythema  NEUROLOGICAL: Alert, oriented, CN II-XII grossly intact, Appropriate balance, No dysmetria, Sensation equal bilaterally, Romberg negative.   PSYCH: Normal behavior,  mood   Assessment & Plan:  Assessment: 1. Migraine with aura and without status migrainosus, not intractable   2. Depression   3. Chronic kidney disease (CKD), stage 3 (moderate)   4. Insomnia   5.     Muscle Spasm  Plan: Migraines worsening with increased frequency - likely stress  related.  Pt to begin gabapentin 100mg  once daily for prevention of migraine.   This may also help her sleep.  Will begin and titrate very slowly as she has a history of not tolerating medications well.   Insomnia - stable - uses Lunesta 2-3 nights per week.  She may not need as often with initiation of gabapentin.   Acute treatment of HA with Vicodin and Phenergan.  She maintains a low and controlled level of narcotic use without routinely asking for increases.  Will maintain.  Depression - Will maintain wellbutrin as pt has not tolerated changes well in past.  Pt encouraged to discuss further with PCP if continues to have concern.  CKD - newly diagnosed by renal doctor and related to kidney.  Will keep in mind for prescription management.  No further NSAIDs.   Pt declines Occipital nerve block today for acute treatment.   Return in 1 month to re-eval after starting new medication (gabapentin).  Plan to increase at that time.   FMLA to be completed.   Pt STRONGLY encouraged to exercise which would help with all of the above.  Light walking advised 3 days per week to start.  Can go to mall to walk.   Follow-up in 1 months or sooner PRN  Paticia Stack, PA-C 01/05/2015 9:48 AM

## 2015-01-05 NOTE — Patient Instructions (Signed)

## 2015-01-07 ENCOUNTER — Encounter: Payer: Self-pay | Admitting: *Deleted

## 2015-01-12 ENCOUNTER — Ambulatory Visit: Payer: Self-pay | Admitting: Pharmacist

## 2015-01-12 ENCOUNTER — Ambulatory Visit: Payer: 59 | Admitting: Internal Medicine

## 2015-01-12 ENCOUNTER — Ambulatory Visit: Payer: Self-pay | Admitting: Internal Medicine

## 2015-01-13 ENCOUNTER — Ambulatory Visit: Payer: Self-pay

## 2015-01-22 ENCOUNTER — Telehealth: Payer: Self-pay | Admitting: *Deleted

## 2015-01-22 NOTE — Telephone Encounter (Signed)
-----   Message from Francia Greaves sent at 01/21/2015  4:49 PM EST ----- Regarding: FMLA Contact: [phone] Wants to know if we received FMLA paper work on her, if its filled out, anything she needs to do.Marland Kitchen?

## 2015-01-22 NOTE — Telephone Encounter (Signed)
Spoke to pt, informed her that we had faxed her FMLA to the number that was provided, pt will check with them and call us back if we need to resend it.

## 2015-01-25 ENCOUNTER — Encounter: Payer: Self-pay | Admitting: Internal Medicine

## 2015-01-25 ENCOUNTER — Encounter (INDEPENDENT_AMBULATORY_CARE_PROVIDER_SITE_OTHER): Payer: Self-pay

## 2015-01-25 ENCOUNTER — Ambulatory Visit (INDEPENDENT_AMBULATORY_CARE_PROVIDER_SITE_OTHER): Payer: 59 | Admitting: Internal Medicine

## 2015-01-25 VITALS — BP 100/70 | HR 46 | Temp 97.8°F | Resp 20 | Wt 238.0 lb

## 2015-01-25 DIAGNOSIS — E119 Type 2 diabetes mellitus without complications: Secondary | ICD-10-CM | POA: Diagnosis not present

## 2015-01-25 DIAGNOSIS — E785 Hyperlipidemia, unspecified: Secondary | ICD-10-CM | POA: Diagnosis not present

## 2015-01-25 DIAGNOSIS — Z23 Encounter for immunization: Secondary | ICD-10-CM | POA: Diagnosis not present

## 2015-01-25 DIAGNOSIS — I10 Essential (primary) hypertension: Secondary | ICD-10-CM | POA: Diagnosis not present

## 2015-01-25 DIAGNOSIS — Z8659 Personal history of other mental and behavioral disorders: Secondary | ICD-10-CM | POA: Diagnosis not present

## 2015-01-25 DIAGNOSIS — R748 Abnormal levels of other serum enzymes: Secondary | ICD-10-CM

## 2015-01-25 DIAGNOSIS — R7989 Other specified abnormal findings of blood chemistry: Secondary | ICD-10-CM

## 2015-01-25 NOTE — Patient Instructions (Addendum)
Prevnar given. Return in 6 months. Continue same medications.

## 2015-01-26 ENCOUNTER — Other Ambulatory Visit: Payer: Self-pay | Admitting: Internal Medicine

## 2015-01-26 LAB — BASIC METABOLIC PANEL
BUN: 33 mg/dL — AB (ref 7–25)
CO2: 25 mmol/L (ref 20–31)
CREATININE: 1.6 mg/dL — AB (ref 0.50–1.05)
Calcium: 9 mg/dL (ref 8.6–10.4)
Chloride: 107 mmol/L (ref 98–110)
Glucose, Bld: 140 mg/dL — ABNORMAL HIGH (ref 65–99)
Potassium: 4.9 mmol/L (ref 3.5–5.3)
Sodium: 144 mmol/L (ref 135–146)

## 2015-01-27 ENCOUNTER — Ambulatory Visit: Payer: Self-pay | Admitting: Pharmacist

## 2015-02-02 ENCOUNTER — Ambulatory Visit: Payer: Self-pay | Admitting: Pharmacist

## 2015-02-02 ENCOUNTER — Encounter: Payer: Self-pay | Admitting: Physician Assistant

## 2015-02-02 ENCOUNTER — Ambulatory Visit (INDEPENDENT_AMBULATORY_CARE_PROVIDER_SITE_OTHER): Payer: 59 | Admitting: Physician Assistant

## 2015-02-02 VITALS — BP 136/78 | HR 80 | Wt 247.0 lb

## 2015-02-02 DIAGNOSIS — G43109 Migraine with aura, not intractable, without status migrainosus: Secondary | ICD-10-CM | POA: Diagnosis not present

## 2015-02-02 DIAGNOSIS — G4700 Insomnia, unspecified: Secondary | ICD-10-CM | POA: Diagnosis not present

## 2015-02-02 MED ORDER — GABAPENTIN 100 MG PO CAPS: 100.0000 mg | ORAL_CAPSULE | Freq: Three times a day (TID) | ORAL | Status: DC

## 2015-02-02 NOTE — Progress Notes (Signed)
Patient ID: Samantha Hahn, female   DOB: [birth year], 54 y.o.   MRN: [id number] History:  Samantha Hahn is a 54 y.o. who presents to clinic today for follow up of headaches.  She has started Neurontin at 100mg  once a day at night.   That has worked well for her.  She has not had bad side effects and is not noting sleepiness with this.  She has been under more stress and has noted her headaches are perhaps slightly improved over the last week.  However she notes this may be a fluke.  No change in the quality of HA.    Her mom has had 4 strokes now and Samantha Hahn is caring for her and her dad whom just had a cardiac cath.    HIT6: 55 Number of days in the last 4 weeks with:  Severe headache: 2 Moderate headache: 6 Mild headache: 12  No headache: 8   Past Medical History  Diagnosis Date  . Asthma with COPD with status asthmaticus ([place])   . Allergy   . Acne rosacea   . GERD (gastroesophageal reflux disease)   . Diabetes mellitus   . Migraines   . Hot flashes   . Anemia   . Abnormal vaginal Pap smear   . Anxiety   . Dysrhythmia     pvc  . Depression     resolved  . Hypertension      resolved,     dr hochrein  . Sleep apnea     workup still in process    Social History   Social History  . Marital Status: Divorced    Spouse Name: N/A  . Number of Children: N/A  . Years of Education: N/A   Occupational History  . PT [place]   Social History Main Topics  . Smoking status: Never Smoker   . Smokeless tobacco: Never Used  . Alcohol Use: Yes     Comment: occ  . Drug Use: No  . Sexual Activity: Not Currently    Birth Control/ Protection: Surgical   Other Topics Concern  . Not on file   Social History Narrative    Family History  Problem Relation Age of Onset  . Heart disease Mother 36    CAD  . Asthma Mother   . Asthma Sister   . Colon cancer Neg Hx   . Esophageal cancer Neg Hx   . Rectal cancer Neg Hx   . Stomach cancer Neg Hx     Allergies  Allergen Reactions   . Clarithromycin Hives  . Vicodin [Hydrocodone-Acetaminophen] Nausea And Vomiting  . Exenatide Other (See Comments)    Rash & itching at injection site    Current Outpatient Prescriptions on File Prior to Visit  Medication Sig Dispense Refill  . albuterol (PROVENTIL HFA;VENTOLIN HFA) 108 (90 BASE) MCG/ACT inhaler Inhale 2 puffs into the lungs every 6 (six) hours as needed. For shortness of breath    . aspirin 325 MG tablet Take 325 mg by mouth daily.    Marland Kitchen atorvastatin (LIPITOR) 20 MG tablet TAKE 1 TABLET BY MOUTH DAILY. 90 tablet 3  . b complex vitamins tablet Take 1 tablet by mouth daily.    . budesonide-formoterol (SYMBICORT) 160-4.5 MCG/ACT inhaler Inhale 2 puffs into the lungs 2 (two) times daily. 1 Inhaler 11  . buPROPion (WELLBUTRIN SR) 150 MG 12 hr tablet Take 1 tablet (150 mg total) by mouth daily. 30 tablet 3  . Calcium-Vitamin D 600-200 MG-UNIT  per tablet Take 1 tablet by mouth daily.    Marland Kitchen doxycycline (DORYX) 100 MG EC tablet Take 1 tablet (100 mg total) by mouth daily. 90 tablet 3  . Eszopiclone 3 MG TABS Take 1 tablet (3 mg total) by mouth at bedtime. Take immediately before bedtime 30 tablet 4  . ferrous fumarate (HEMOCYTE - 106 MG FE) 325 (106 FE) MG TABS Take 1 tablet by mouth daily.    Marland Kitchen gabapentin (NEURONTIN) 100 MG capsule Take 1 capsule (100 mg total) by mouth at bedtime. 30 capsule 1  . glipiZIDE (GLUCOTROL XL) 10 MG 24 hr tablet Take 1 tablet (10 mg total) by mouth daily. 90 tablet 3  . HYDROcodone-acetaminophen (NORCO/VICODIN) 5-325 MG tablet   0  . JANUVIA 100 MG tablet TAKE 1 TABLET BY MOUTH ONCE DAILY 90 tablet 3  . loratadine (CLARITIN) 10 MG tablet Take 10 mg by mouth daily after breakfast. For allergies    . losartan (COZAAR) 25 MG tablet TAKE 1 TABLET BY MOUTH DAILY. 90 tablet 3  . metFORMIN (GLUCOPHAGE) 1000 MG tablet TAKE 1 TABLET BY MOUTH TWICE DAILY 180 tablet PRN  . metoprolol succinate (TOPROL-XL) 50 MG 24 hr tablet TAKE 1 TABLET BY MOUTH ONCE DAILY 90  tablet PRN  . metroNIDAZOLE (METROCREAM) 0.75 % cream Apply topically 2 (two) times daily. To face 45 g prn  . montelukast (SINGULAIR) 10 MG tablet Take 1 tablet (10 mg total) by mouth every morning. 90 tablet 1  . multivitamin (THERAGRAN) per tablet Take 1 tablet by mouth daily.      . pioglitazone (ACTOS) 45 MG tablet TAKE 1 TABLET BY MOUTH ONCE DAILY 90 tablet 2  . promethazine (PHENERGAN) 25 MG tablet Take 1 tablet (25 mg total) by mouth every 8 (eight) hours as needed for nausea or vomiting. 30 tablet 3  . QNASL 80 MCG/ACT AERS INHALE 1 PUFF IN EACH NOSTRIL ONCE DAILY FOR STUFFY NOSE OR DRAINAGE 8.7 g 11  . TRUEPLUS LANCETS 30G MISC TEST BLOOD GLUCOSE TWICE DAILY 200 each 3  . TRUETEST TEST test strip TEST TWICE DAILY AS NEEDED 200 each 6   No current facility-administered medications on file prior to visit.     Review of Systems:  All pertinent positive/negative included in HPI, all other review of systems are negative  Objective:  Physical Exam BP 136/78 mmHg  Pulse 80  Wt 247 lb (112.038 kg) CONSTITUTIONAL: Well-developed, well-nourished female in no acute distress.  EYES: EOM intact ENT: Normocephalic CARDIOVASCULAR: Regular rate and rhythm with no adventitious sounds.  RESPIRATORY: Normal rate. Clear to auscultation bilaterally.  ENDOCRINE: Normal thyroid.  MUSCULOSKELETAL: Normal ROM, strength equal bilaterally, bilat trap muscle spasm noted SKIN: Warm, dry without erythema  NEUROLOGICAL: Alert, oriented, CN II-XII grossly intact, Appropriate balance.   PSYCH: Normal behavior, mood   Assessment & Plan:  Assessment: 1. Migraine with aura and without status migrainosus, not intractable   2. Insomnia    Plan: Will increase Gabapentin up to TID.  She will routinely use bid and may add the third dose as tolerated or prn.   Continue acute meds unchanged. Follow-up in 79months or sooner PRN  Paticia Stack, PA-C 02/02/2015 10:10 AM

## 2015-02-02 NOTE — Patient Instructions (Signed)

## 2015-02-05 ENCOUNTER — Other Ambulatory Visit: Payer: Self-pay | Admitting: Internal Medicine

## 2015-02-16 ENCOUNTER — Ambulatory Visit: Payer: Self-pay

## 2015-03-22 ENCOUNTER — Other Ambulatory Visit: Payer: Self-pay

## 2015-03-22 MED ORDER — GLIPIZIDE ER 10 MG PO TB24: 10.0000 mg | ORAL_TABLET | Freq: Every day | ORAL | Status: DC

## 2015-03-22 MED FILL — BUPROPION SR 150 MG TABLET: 150 | 30 days supply | Qty: 30 | Fill #8

## 2015-03-22 MED FILL — glipiZIDE XL 10 MG TB24: 10 | 90 days supply | Qty: 90 | Fill #0

## 2015-03-22 MED FILL — PIOGLITAZONE HCL 45 MG TAB: 45 | 90 days supply | Qty: 90 | Fill #2

## 2015-03-22 NOTE — Telephone Encounter (Signed)
Please advise; refill auth fax received 03/21/14

## 2015-03-31 ENCOUNTER — Ambulatory Visit: Payer: Self-pay

## 2015-04-13 ENCOUNTER — Ambulatory Visit: Payer: Self-pay | Admitting: Pharmacist

## 2015-04-15 ENCOUNTER — Other Ambulatory Visit: Payer: Self-pay | Admitting: Internal Medicine

## 2015-04-15 MED FILL — BUPROPION SR 150 MG TABLET: 150 | 30 days supply | Qty: 30 | Fill #0

## 2015-04-15 MED FILL — DOXYCYCLINE HYCLATE 100 MG: 100 | 90 days supply | Qty: 90 | Fill #0

## 2015-04-15 MED FILL — GABAPENTIN 100 MG CAPSULE: 100 | 30 days supply | Qty: 90 | Fill #1

## 2015-04-15 MED FILL — metFORMIN HCL 1000 MG TABS: 1000 | 90 days supply | Qty: 180 | Fill #2

## 2015-04-18 NOTE — Progress Notes (Signed)
   Subjective:    Patient ID: Samantha Hahn, female    DOB: December 13, 1960, 55 y.o.   MRN: [id number]  HPI In today for six-month recheck on essential hypertension, controlled like to diabetes mellitus, history of depression, hyperlipidemia. Lipid panel was within normal limits. She has a history of elevated serum creatinine and is followed at [facility]. Serum creatinine is 1.60. Long-standing history of essential hypertension. Diabetes well controlled with hemoglobin A1c of 7%. Depression treated by psychiatrist.    Review of Systems     Objective:   Physical Exam  Skin warm and dry. Nodes none. Neck is supple without JVD thyromegaly or carotid bruits. Chest clear to auscultation. Cardiac exam regular rate and rhythm normal S1 and S2. Extremities without edema.      Assessment & Plan:

## 2015-04-20 ENCOUNTER — Encounter: Payer: Self-pay | Admitting: Pharmacist

## 2015-04-20 ENCOUNTER — Ambulatory Visit (INDEPENDENT_AMBULATORY_CARE_PROVIDER_SITE_OTHER): Payer: Self-pay | Admitting: Family Medicine

## 2015-04-20 VITALS — BP 124/74 | Wt 248.0 lb

## 2015-04-20 DIAGNOSIS — E118 Type 2 diabetes mellitus with unspecified complications: Secondary | ICD-10-CM

## 2015-04-20 MED FILL — LOSARTAN POTASSIUM 25 MG TA: 25 | 90 days supply | Qty: 90 | Fill #1

## 2015-04-20 NOTE — Progress Notes (Signed)
Subjective:  Patient presents today for 3 month diabetes follow-up as part of the employer-sponsored Link to Wellness program.  Current diabetes regimen includes glipizide, januvia, pioglitazone, and metformin.  Patient also continues on daily ASA, ARB, and statin.  Most recent MD follow-up was Dec 2016.  Patient has a pending appt for 6 month labs/physical in June 2017.  No med changes.  SCr remained elevated at most recent PCP visit, MD aware, pt is also seen by nephrology.       Assessment/Plan:    Patient is a 55 yo female with DM type 2. Most recent A1C was 7.0% (prev 7.1%) which remains above goal of less than 6.5%. Weight has remained the same since last visit.  Patient is compliant with medications and is tolerating regimen well.   She did not bring a meter today because she does not test regularly.  She usually tests 1-2 times per month and as needed for symptoms of hypoglycemia.  When she has tested recently fasting has been 100-140s.  Hypoglycemia is rare and has occurred twice in the previous 3-6 months, as low as 40s.  Patient does demonstrate appropriate correction.  She continues to use TrueResult on occasion and we have refilled strips today as her previous supply had expired.  Eye and dental exams are up to date.  Also of note, patient has noticed some mild intermittent numbness in left foot toe.  I have asked pt to test glucose at these times to determine a possible correlation between elevations and numbness.         Lifestyle:  Physical Activity-  Still maintains [employer] membership but has not had time for exercise over the previous several months due to adjusting to caring for elderly parents (together they have suffered three strokes, gall bladder blockage, and stent placement).  Patient is now getting back to a more normal routine and would like to walk at least once weekly to start.    Nutrition-  Diet has been somewhat erratic due to care of elderly parents and needing to eat on  the go a lot.  She also often eats with her parents and reports that often these meals are the healthiest ones as she attempts to ensure that her parents have healthy diets.  On a positive note, she has been able to eat earlier in the evening vs later.     Goals for Next Visit:  1)  Resume healthy dietary choices and continue to monitor portion control 2)  Resume exercising once weekly, walking  3)  Continue testing as needed, especially when/if symptomatic and when numbness occurs 4)  Continue to be compliant with medications 5)  Follow-up on Wednesday July 19th @ 1:30 pm  Great to see you today!   Tilman Neat, PharmD [facility]

## 2015-04-20 NOTE — Patient Instructions (Signed)
1)  Resume healthy dietary choices and continue to monitor portion control 2)  Resume exercising once weekly, walking  3)  Continue testing as needed, especially when/if symptomatic and when numbness occurs 4)  Continue to be compliant with medications 5)  Follow-up on Wednesday July 19th @ 1:30 pm  Great to see you today!

## 2015-04-23 MED FILL — ATORVASTATIN 20 MG TABLET: 20 | 90 days supply | Qty: 90 | Fill #2

## 2015-05-02 NOTE — Progress Notes (Signed)
I have reviewed this pharmacist's note and agree  

## 2015-05-17 ENCOUNTER — Ambulatory Visit: Payer: Self-pay

## 2015-05-19 ENCOUNTER — Other Ambulatory Visit: Payer: Self-pay | Admitting: Internal Medicine

## 2015-05-19 MED FILL — JANUVIA 100 MG TABLET: 100 | 90 days supply | Qty: 90 | Fill #2

## 2015-05-19 MED FILL — MONTELUKAST SOD 10 MG TAB: 10 | 90 days supply | Qty: 90 | Fill #0

## 2015-06-03 MED FILL — METOPROLOL SUCC ER 50 MG TA: 50 | 90 days supply | Qty: 90 | Fill #1

## 2015-06-03 MED FILL — BUPROPION SR 150 MG TABLET: 150 | 30 days supply | Qty: 30 | Fill #1

## 2015-06-07 ENCOUNTER — Ambulatory Visit: Payer: Self-pay

## 2015-06-11 ENCOUNTER — Encounter: Payer: 59 | Admitting: Physician Assistant

## 2015-06-14 MED FILL — GABAPENTIN 100 MG CAPSULE: 100 | 30 days supply | Qty: 90 | Fill #2

## 2015-06-17 ENCOUNTER — Other Ambulatory Visit: Payer: Self-pay

## 2015-06-17 DIAGNOSIS — Z1231 Encounter for screening mammogram for malignant neoplasm of breast: Secondary | ICD-10-CM

## 2015-06-18 ENCOUNTER — Encounter (INDEPENDENT_AMBULATORY_CARE_PROVIDER_SITE_OTHER): Payer: 59 | Admitting: *Deleted

## 2015-06-18 DIAGNOSIS — G43009 Migraine without aura, not intractable, without status migrainosus: Secondary | ICD-10-CM

## 2015-06-21 ENCOUNTER — Encounter: Payer: Self-pay | Admitting: *Deleted

## 2015-06-23 ENCOUNTER — Ambulatory Visit: Payer: Self-pay

## 2015-07-08 MED FILL — BUPROPION SR 150 MG TABLET: 150 | 30 days supply | Qty: 30 | Fill #2

## 2015-07-15 ENCOUNTER — Encounter: Payer: Self-pay | Admitting: Internal Medicine

## 2015-07-15 ENCOUNTER — Ambulatory Visit (INDEPENDENT_AMBULATORY_CARE_PROVIDER_SITE_OTHER): Payer: 59 | Admitting: Internal Medicine

## 2015-07-15 VITALS — BP 122/80 | HR 73 | Temp 97.0°F | Resp 18 | Wt 250.0 lb

## 2015-07-15 DIAGNOSIS — T148 Other injury of unspecified body region: Secondary | ICD-10-CM | POA: Diagnosis not present

## 2015-07-15 DIAGNOSIS — S8992XA Unspecified injury of left lower leg, initial encounter: Secondary | ICD-10-CM | POA: Diagnosis not present

## 2015-07-15 DIAGNOSIS — S8991XA Unspecified injury of right lower leg, initial encounter: Secondary | ICD-10-CM | POA: Diagnosis not present

## 2015-07-15 DIAGNOSIS — S8001XA Contusion of right knee, initial encounter: Secondary | ICD-10-CM

## 2015-07-15 DIAGNOSIS — T07XXXA Unspecified multiple injuries, initial encounter: Secondary | ICD-10-CM

## 2015-07-15 NOTE — Progress Notes (Signed)
   Subjective:    Patient ID: Samantha Hahn, female    DOB: [birth year], 55 y.o.   MRN: [id number]  HPI 55 year old female was at movie theater in [place] yesterday with her mother. She was exiting the theater room, going down around and fell onto a carpeted surface injuring both knees. Says she was trying to help her mother exit. Sustained bruises both knees. Today she fell outside as she felt her left knee give way. She struck both knees again.  Patient says that she has been seen at  [facility] previously. We will attempt again appointment for her to be seen there.  Says left knee is very sore and stiff.    Review of Systems     Objective:   Physical Exam  Multiple contused areas both knees. Has 2 small contusions left inner arm. Appears to have small effusion right knee. No joint line tenderness in the right knee. No ligament tenderness in the right knee. She has tenderness along left lateral collateral ligament of left knee and joint line tenderness. No effusion in left knee. Good range of motion both knees and minimal quadriceps inhibition.      Assessment & Plan:  Contusions both knees secondary to 2 falls  Hematoma right knee  Possible internal derangement left knee  Plan: I spoke is knees for 20 minutes twice daily. Note given to be out of work through Sunday, May 28. Orthopedic appointment. We'll defer x-rays to orthopedist. Patient has ibuprofen and can take 800 mg 3 times daily. She also has Vicodin on hand at home and may take that as needed for pain.

## 2015-07-15 NOTE — Patient Instructions (Signed)
Ibuprofen and Vicodin as needed for pain soreness and stiffness. Ice knees for 20 minutes twice daily. Out of work until Monday, May 29. Orthopedic appointment.

## 2015-07-16 ENCOUNTER — Telehealth: Payer: Self-pay | Admitting: Internal Medicine

## 2015-07-16 DIAGNOSIS — S8992XA Unspecified injury of left lower leg, initial encounter: Secondary | ICD-10-CM | POA: Diagnosis not present

## 2015-07-16 NOTE — Telephone Encounter (Signed)
Fax received from [employer] [facility] [employer].  Appointment with Dr. Vickki Hearing 07/16/15 @ 2pm.

## 2015-07-20 ENCOUNTER — Other Ambulatory Visit: Payer: Self-pay | Admitting: Internal Medicine

## 2015-07-20 MED FILL — glipiZIDE XL 10 MG TB24: 10 | 90 days supply | Qty: 90 | Fill #1

## 2015-07-20 MED FILL — PIOGLITAZONE HCL 45 MG TAB: 45 | 90 days supply | Qty: 90 | Fill #0

## 2015-07-23 DIAGNOSIS — S82832D Other fracture of upper and lower end of left fibula, subsequent encounter for closed fracture with routine healing: Secondary | ICD-10-CM | POA: Diagnosis not present

## 2015-07-26 ENCOUNTER — Other Ambulatory Visit: Payer: 59 | Admitting: Internal Medicine

## 2015-07-27 ENCOUNTER — Encounter: Payer: 59 | Admitting: Internal Medicine

## 2015-07-28 DIAGNOSIS — E669 Obesity, unspecified: Secondary | ICD-10-CM | POA: Diagnosis not present

## 2015-07-28 DIAGNOSIS — E119 Type 2 diabetes mellitus without complications: Secondary | ICD-10-CM | POA: Diagnosis not present

## 2015-07-28 DIAGNOSIS — N183 Chronic kidney disease, stage 3 (moderate): Secondary | ICD-10-CM | POA: Diagnosis not present

## 2015-07-28 DIAGNOSIS — I10 Essential (primary) hypertension: Secondary | ICD-10-CM | POA: Diagnosis not present

## 2015-07-30 ENCOUNTER — Encounter: Payer: 59 | Admitting: Physician Assistant

## 2015-08-13 MED FILL — DOXYCYCLINE HYCLATE 100 MG: 100 | 90 days supply | Qty: 90 | Fill #1

## 2015-08-13 MED FILL — GABAPENTIN 100 MG CAPSULE: 100 | 30 days supply | Qty: 90 | Fill #3

## 2015-08-13 MED FILL — BUPROPION SR 150 MG TABLET: 150 | 30 days supply | Qty: 30 | Fill #3

## 2015-08-19 DIAGNOSIS — I10 Essential (primary) hypertension: Secondary | ICD-10-CM | POA: Diagnosis not present

## 2015-08-23 MED FILL — ATORVASTATIN 20 MG TABLET: 20 | 90 days supply | Qty: 90 | Fill #3

## 2015-08-27 ENCOUNTER — Ambulatory Visit (INDEPENDENT_AMBULATORY_CARE_PROVIDER_SITE_OTHER): Payer: 59 | Admitting: Physician Assistant

## 2015-08-27 ENCOUNTER — Encounter: Payer: Self-pay | Admitting: Physician Assistant

## 2015-08-27 VITALS — BP 128/74 | HR 75 | Resp 16 | Ht 68.0 in | Wt 250.0 lb

## 2015-08-27 DIAGNOSIS — G43109 Migraine with aura, not intractable, without status migrainosus: Secondary | ICD-10-CM | POA: Diagnosis not present

## 2015-08-27 DIAGNOSIS — F329 Major depressive disorder, single episode, unspecified: Secondary | ICD-10-CM

## 2015-08-27 DIAGNOSIS — F32A Depression, unspecified: Secondary | ICD-10-CM

## 2015-08-27 MED ORDER — GABAPENTIN 100 MG PO CAPS: 100.0000 mg | ORAL_CAPSULE | Freq: Three times a day (TID) | ORAL | Status: DC

## 2015-08-27 MED ORDER — BUPROPION HCL ER (SR) 200 MG PO TB12: 200.0000 mg | ORAL_TABLET | Freq: Every day | ORAL | Status: DC

## 2015-08-27 MED ORDER — PROMETHAZINE HCL 25 MG PO TABS: 25.0000 mg | ORAL_TABLET | Freq: Three times a day (TID) | ORAL | Status: DC | PRN

## 2015-08-27 MED ORDER — ESZOPICLONE 1 MG PO TABS: 1.0000 mg | ORAL_TABLET | Freq: Every evening | ORAL | Status: DC | PRN

## 2015-08-27 MED ORDER — HYDROCODONE-ACETAMINOPHEN 5-325 MG PO TABS: 1.0000 | ORAL_TABLET | Freq: Four times a day (QID) | ORAL | Status: DC | PRN

## 2015-08-27 MED FILL — PROMETHAZINE 25 MG TABLET: 25 | 10 days supply | Qty: 30 | Fill #0

## 2015-08-27 MED FILL — BUPROPION HCL SR 200 MG TAB: 200 | 30 days supply | Qty: 30 | Fill #0

## 2015-08-27 MED FILL — HYDROCODON-APAP 5-325: 5-325 | 8 days supply | Qty: 30 | Fill #0

## 2015-08-27 MED FILL — ESZOPICLONE 1 MG TABLET: 1 | 30 days supply | Qty: 30 | Fill #0

## 2015-08-27 NOTE — Patient Instructions (Signed)

## 2015-08-27 NOTE — Progress Notes (Signed)
Patient ID: Samantha Hahn, female   DOB: 01/03/1961, 55 y.o.   MRN: [id number] History:  Samantha Hahn is a 55 y.o. No obstetric history on file. who presents to clinic today for headache management.  Creatinine has increased some and therefore she had to discontinue Losartan.  Her BP has been good despite this change.  Her blood sugars are still all over the place.  Her counselor recommended increasing Wellbutrin as she is having some difficulty managing the stress of aging parents.   This helps overall with headache prevention.   Last visit, Neurontin was increased to twice daily with the option for a third dosing as needed.  Twice daily dosing has helped with her HA pain and she has only needed to use the 3rd cap 6 times in the last 6 months.  HAs are both less severe and less frequent.   Same HA quality.     HIT6:57 Number of days in the last 4 weeks with:  Severe headache: 1 Moderate headache: 18 Mild headache: 7 No headache: 4  Past Medical History  Diagnosis Date  . Asthma with COPD with status asthmaticus ([place])   . Allergy   . Acne rosacea   . GERD (gastroesophageal reflux disease)   . Diabetes mellitus   . Migraines   . Hot flashes   . Anemia   . Abnormal vaginal Pap smear   . Anxiety   . Dysrhythmia     pvc  . Depression     resolved  . Hypertension      resolved,     dr hochrein  . Sleep apnea     workup still in process    Social History   Social History  . Marital Status: Divorced    Spouse Name: N/A  . Number of Children: N/A  . Years of Education: N/A   Occupational History  . PT [employer]   Social History Main Topics  . Smoking status: Never Smoker   . Smokeless tobacco: Never Used  . Alcohol Use: Yes     Comment: occ  . Drug Use: No  . Sexual Activity: Not Currently    Birth Control/ Protection: Surgical   Other Topics Concern  . Not on file   Social History Narrative    Family History  Problem Relation Age of Onset  . Heart disease  Mother 67    CAD  . Asthma Mother   . Asthma Sister   . Colon cancer Neg Hx   . Esophageal cancer Neg Hx   . Rectal cancer Neg Hx   . Stomach cancer Neg Hx     Allergies  Allergen Reactions  . Clarithromycin Hives  . Vicodin [Hydrocodone-Acetaminophen] Nausea And Vomiting  . Exenatide Other (See Comments)    Rash & itching at injection site    Current Outpatient Prescriptions on File Prior to Visit  Medication Sig Dispense Refill  . albuterol (PROVENTIL HFA;VENTOLIN HFA) 108 (90 BASE) MCG/ACT inhaler Inhale 2 puffs into the lungs every 6 (six) hours as needed. For shortness of breath    . aspirin 325 MG tablet Take 325 mg by mouth daily.    Marland Kitchen atorvastatin (LIPITOR) 20 MG tablet TAKE 1 TABLET BY MOUTH DAILY. 90 tablet 3  . b complex vitamins tablet Take 1 tablet by mouth daily.    . budesonide-formoterol (SYMBICORT) 160-4.5 MCG/ACT inhaler Inhale 2 puffs into the lungs 2 (two) times daily. 1 Inhaler 11  . Calcium-Vitamin D 600-200 MG-UNIT per  tablet Take 1 tablet by mouth daily.    Marland Kitchen doxycycline (VIBRA-TABS) 100 MG tablet TAKE 1 TABLET BY MOUTH DAILY. 90 tablet 1  . ferrous fumarate (HEMOCYTE - 106 MG FE) 325 (106 FE) MG TABS Take 1 tablet by mouth daily.    Marland Kitchen gabapentin (NEURONTIN) 100 MG capsule Take 1 capsule (100 mg total) by mouth 3 (three) times daily. 90 capsule 5  . glipiZIDE (GLUCOTROL XL) 10 MG 24 hr tablet Take 1 tablet (10 mg total) by mouth daily. 90 tablet 3  . HYDROcodone-acetaminophen (NORCO/VICODIN) 5-325 MG tablet Reported on 04/20/2015  0  . JANUVIA 100 MG tablet TAKE 1 TABLET BY MOUTH ONCE DAILY 90 tablet 3  . loratadine (CLARITIN) 10 MG tablet Take 10 mg by mouth daily after breakfast. For allergies    . losartan (COZAAR) 25 MG tablet TAKE 1 TABLET BY MOUTH DAILY. 90 tablet 3  . metFORMIN (GLUCOPHAGE) 1000 MG tablet TAKE 1 TABLET BY MOUTH TWICE DAILY 180 tablet PRN  . metoprolol succinate (TOPROL-XL) 50 MG 24 hr tablet TAKE 1 TABLET BY MOUTH ONCE DAILY 90 tablet  1  . metroNIDAZOLE (METROCREAM) 0.75 % cream Apply topically 2 (two) times daily. To face 45 g prn  . montelukast (SINGULAIR) 10 MG tablet TAKE 1 TABLET BY MOUTH EVERY MORNING. 90 tablet 1  . multivitamin (THERAGRAN) per tablet Take 1 tablet by mouth daily.      . pioglitazone (ACTOS) 45 MG tablet TAKE 1 TABLET BY MOUTH ONCE DAILY 90 tablet 3  . promethazine (PHENERGAN) 25 MG tablet Take 1 tablet (25 mg total) by mouth every 8 (eight) hours as needed for nausea or vomiting. 30 tablet 3  . QNASL 80 MCG/ACT AERS INHALE 1 PUFF IN EACH NOSTRIL ONCE DAILY FOR STUFFY NOSE OR DRAINAGE 8.7 g 11  . TRUEPLUS LANCETS 30G MISC TEST BLOOD GLUCOSE TWICE DAILY 200 each 3  . TRUETEST TEST test strip TEST TWICE DAILY AS NEEDED 200 each 6   No current facility-administered medications on file prior to visit.     Review of Systems:  All pertinent positive/negative included in HPI, all other review of systems are negative  Objective:  Physical Exam BP 128/74 mmHg  Pulse 75  Resp 16  Ht 5\' 8"  (1.727 m)  Wt 250 lb (113.399 kg)  BMI 38.02 kg/m2 CONSTITUTIONAL: Well-developed, well-nourished female in no acute distress.  EYES: EOM intact ENT: Normocephalic CARDIOVASCULAR: Regular rate and rhythm with no adventitious sounds.  RESPIRATORY: Normal rate. Clear to auscultation bilaterally.  ENDOCRINE: Normal thyroid.  MUSCULOSKELETAL: Normal ROM, strength equal bilaterally, cervical muscle spasm noted SKIN: Warm, dry without erythema  NEUROLOGICAL: Alert, oriented, CN II-XII grossly intact, Appropriate balance PSYCH: Normal behavior, mood   Assessment & Plan:  Assessment: Improving migraine headaches - likely improved on Neurontin Depression worsened - comorbid with migraine 1. Depression   2. Migraine with aura and without status migrainosus, not intractable      Plan: Maintain current neurontin plan and acute medications Increase wellbutrin to 200mg  daily. Continue with counselor Follow-up in  6 months or sooner PRN  Paticia Stack, PA-C 08/27/2015 10:32 AM

## 2015-08-30 MED FILL — metFORMIN HCL 1000 MG TABS: 1000 | 90 days supply | Qty: 180 | Fill #3

## 2015-08-31 DIAGNOSIS — S82832D Other fracture of upper and lower end of left fibula, subsequent encounter for closed fracture with routine healing: Secondary | ICD-10-CM | POA: Diagnosis not present

## 2015-09-07 MED FILL — JANUVIA 100 MG TABLET: 100 | 90 days supply | Qty: 90 | Fill #3

## 2015-09-07 MED FILL — MONTELUKAST SOD 10 MG TAB: 10 | 90 days supply | Qty: 90 | Fill #1

## 2015-09-08 ENCOUNTER — Ambulatory Visit: Payer: Self-pay | Admitting: Pharmacist

## 2015-09-15 ENCOUNTER — Other Ambulatory Visit: Payer: Self-pay | Admitting: Internal Medicine

## 2015-09-15 MED FILL — METOPROLOL SUCC ER 50 MG TA: 50 | 90 days supply | Qty: 90 | Fill #0

## 2015-09-16 ENCOUNTER — Other Ambulatory Visit: Payer: Self-pay | Admitting: Internal Medicine

## 2015-09-16 MED FILL — QNASL 80 MCG NASAL SPRAY: 80 | 30 days supply | Qty: 9 | Fill #0

## 2015-09-16 MED FILL — SYMBICORT 160-4.5 MCG INH: 160-4.5 | 30 days supply | Qty: 10 | Fill #0

## 2015-09-20 ENCOUNTER — Other Ambulatory Visit: Payer: 59 | Admitting: Internal Medicine

## 2015-09-20 DIAGNOSIS — E118 Type 2 diabetes mellitus with unspecified complications: Secondary | ICD-10-CM

## 2015-09-20 DIAGNOSIS — I10 Essential (primary) hypertension: Secondary | ICD-10-CM | POA: Diagnosis not present

## 2015-09-20 DIAGNOSIS — Z0000 Encounter for general adult medical examination without abnormal findings: Secondary | ICD-10-CM | POA: Diagnosis not present

## 2015-09-20 DIAGNOSIS — N183 Chronic kidney disease, stage 3 (moderate): Secondary | ICD-10-CM

## 2015-09-20 LAB — CBC WITH DIFFERENTIAL/PLATELET
BASOS PCT: 0 %
Basophils Absolute: 0 cells/uL (ref 0–200)
Eosinophils Absolute: 336 cells/uL (ref 15–500)
Eosinophils Relative: 6 %
HEMATOCRIT: 36 % (ref 35.0–45.0)
HEMOGLOBIN: 11.4 g/dL — AB (ref 11.7–15.5)
LYMPHS ABS: 1120 {cells}/uL (ref 850–3900)
Lymphocytes Relative: 20 %
MCH: 29.6 pg (ref 27.0–33.0)
MCHC: 31.7 g/dL — ABNORMAL LOW (ref 32.0–36.0)
MCV: 93.5 fL (ref 80.0–100.0)
MONO ABS: 448 {cells}/uL (ref 200–950)
MPV: 11.2 fL (ref 7.5–12.5)
Monocytes Relative: 8 %
NEUTROS ABS: 3696 {cells}/uL (ref 1500–7800)
Neutrophils Relative %: 66 %
Platelets: 181 10*3/uL (ref 140–400)
RBC: 3.85 MIL/uL (ref 3.80–5.10)
RDW: 13.8 % (ref 11.0–15.0)
WBC: 5.6 10*3/uL (ref 3.8–10.8)

## 2015-09-20 LAB — COMPLETE METABOLIC PANEL WITH GFR
ALBUMIN: 3.8 g/dL (ref 3.6–5.1)
ALK PHOS: 63 U/L (ref 33–130)
ALT: 22 U/L (ref 6–29)
AST: 17 U/L (ref 10–35)
BUN: 29 mg/dL — AB (ref 7–25)
CO2: 26 mmol/L (ref 20–31)
Calcium: 8.9 mg/dL (ref 8.6–10.4)
Chloride: 106 mmol/L (ref 98–110)
Creat: 1.64 mg/dL — ABNORMAL HIGH (ref 0.50–1.05)
GFR, Est African American: 40 mL/min — ABNORMAL LOW (ref >=60)
GFR, Est Non African American: 35 mL/min — ABNORMAL LOW (ref >=60)
GLUCOSE: 146 mg/dL — AB (ref 65–99)
POTASSIUM: 4.8 mmol/L (ref 3.5–5.3)
SODIUM: 141 mmol/L (ref 135–146)
TOTAL PROTEIN: 6 g/dL — AB (ref 6.1–8.1)
Total Bilirubin: 0.5 mg/dL (ref 0.2–1.2)

## 2015-09-20 LAB — TSH: TSH: 0.66 m[IU]/L

## 2015-09-20 LAB — LIPID PANEL
CHOL/HDL RATIO: 2.6 ratio (ref <=5.0)
Cholesterol: 99 mg/dL — ABNORMAL LOW (ref 125–200)
HDL: 38 mg/dL — ABNORMAL LOW (ref >=46)
LDL Cholesterol: 31 mg/dL (ref <130)
Triglycerides: 151 mg/dL — ABNORMAL HIGH (ref <150)
VLDL: 30 mg/dL (ref <30)

## 2015-09-20 LAB — HEMOGLOBIN A1C
HEMOGLOBIN A1C: 6.9 % — AB (ref <5.7)
Mean Plasma Glucose: 151 mg/dL

## 2015-09-20 NOTE — Addendum Note (Signed)
Addended by: Milta Deiters on: 09/20/2015 03:34 PM   Modules accepted: Orders

## 2015-09-21 ENCOUNTER — Ambulatory Visit (INDEPENDENT_AMBULATORY_CARE_PROVIDER_SITE_OTHER): Payer: 59 | Admitting: Internal Medicine

## 2015-09-21 ENCOUNTER — Encounter: Payer: Self-pay | Admitting: Internal Medicine

## 2015-09-21 VITALS — BP 132/74 | HR 60 | Temp 97.6°F | Ht 68.0 in | Wt 252.0 lb

## 2015-09-21 DIAGNOSIS — N183 Chronic kidney disease, stage 3 unspecified: Secondary | ICD-10-CM

## 2015-09-21 DIAGNOSIS — Z0000 Encounter for general adult medical examination without abnormal findings: Secondary | ICD-10-CM | POA: Diagnosis not present

## 2015-09-21 DIAGNOSIS — J309 Allergic rhinitis, unspecified: Secondary | ICD-10-CM

## 2015-09-21 DIAGNOSIS — Z8669 Personal history of other diseases of the nervous system and sense organs: Secondary | ICD-10-CM

## 2015-09-21 DIAGNOSIS — K219 Gastro-esophageal reflux disease without esophagitis: Secondary | ICD-10-CM | POA: Diagnosis not present

## 2015-09-21 DIAGNOSIS — I10 Essential (primary) hypertension: Secondary | ICD-10-CM

## 2015-09-21 DIAGNOSIS — F32A Depression, unspecified: Secondary | ICD-10-CM

## 2015-09-21 DIAGNOSIS — F329 Major depressive disorder, single episode, unspecified: Secondary | ICD-10-CM | POA: Diagnosis not present

## 2015-09-21 DIAGNOSIS — F3170 Bipolar disorder, currently in remission, most recent episode unspecified: Secondary | ICD-10-CM

## 2015-09-21 DIAGNOSIS — E119 Type 2 diabetes mellitus without complications: Secondary | ICD-10-CM | POA: Diagnosis not present

## 2015-09-21 DIAGNOSIS — G4700 Insomnia, unspecified: Secondary | ICD-10-CM

## 2015-09-21 DIAGNOSIS — Z8709 Personal history of other diseases of the respiratory system: Secondary | ICD-10-CM

## 2015-09-21 DIAGNOSIS — G4733 Obstructive sleep apnea (adult) (pediatric): Secondary | ICD-10-CM | POA: Diagnosis not present

## 2015-09-21 LAB — VITAMIN D 25 HYDROXY (VIT D DEFICIENCY, FRACTURES): Vit D, 25-Hydroxy: 39 ng/mL (ref 30–100)

## 2015-09-21 LAB — MICROALBUMIN / CREATININE URINE RATIO
Creatinine, Urine: 131 mg/dL (ref 20–320)
MICROALB UR: 2.2 mg/dL
Microalb Creat Ratio: 17 mcg/mg creat (ref <30)

## 2015-09-24 ENCOUNTER — Encounter: Payer: 59 | Admitting: Physician Assistant

## 2015-09-28 MED FILL — BUPROPION HCL SR 200 MG TAB: 200 | 30 days supply | Qty: 30 | Fill #1

## 2015-09-29 MED FILL — GABAPENTIN 100 MG CAPSULE: 100 | 90 days supply | Qty: 270 | Fill #0

## 2015-10-05 ENCOUNTER — Other Ambulatory Visit: Payer: Self-pay | Admitting: Pharmacist

## 2015-10-05 ENCOUNTER — Encounter: Payer: Self-pay | Admitting: Pharmacist

## 2015-10-05 VITALS — BP 122/78 | Wt 246.0 lb

## 2015-10-05 DIAGNOSIS — E1122 Type 2 diabetes mellitus with diabetic chronic kidney disease: Secondary | ICD-10-CM

## 2015-10-05 DIAGNOSIS — N183 Chronic kidney disease, stage 3 (moderate): Principal | ICD-10-CM

## 2015-10-05 NOTE — Patient Outreach (Signed)
[facility]) Care Management  10/05/2015  Samantha Hahn 1960-08-31 [id number]   Subjective:  Patient presents today for 3 month diabetes follow-up as part of the employer-sponsored Link to Wellness program. Current diabetes regimen includes glipizide, januvia, pioglitazone, and metformin. Patient also continues on daily ASA and statin. ARB was discontinued by nephrologist due to stage 3 CKD.  Most recent MD follow-up was Aug 2017. Patient has a pending appt for 6 month labs/physical in Feb 2018. SCr remained elevated at most recent PCP visit, pt is no being seen by nephrology once yearly and as needed.      Assessment/Plan:    Patient is a 55 yo female with DM type 2. Most recent A1C was 6.9% (prev 7.0%) which remains above goal of less than 6.5%. Weight has decreased by 5 lb since last visit.  Patient is compliant with medications and is tolerating regimen well.   She did not bring a meter today because she does not test regularly.  She usually tests only as needed for symptoms of hypoglycemia.  Hypoglycemia is occassional (2-3 times monthly) as low as 40s. Usually occurs at night, awakening pt from sleep with symptoms of GI issues, vision changes, or shakiness.  Patient does demonstrate appropriate correction by testing, eating bread, crackers, or drinking milk and retesting in 15 minutes.  She continues to use TrueResult on occasion and we have refilled strips today as her previous supply had expired.  I have encouraged her to check exp date on current strips.  When strips expired or used up, we will switch to TrueMetrix.  Eye and dental exams are up to date.  Patient is due for eye exam this fall and notes that office generally calls with a yearly reminder to schedule.  Also of note, patient has noticed some mild intermittent numbness in left foot toe.  No worsening.        Lifestyle:  Physical Activity-  Still maintains [employer] membership but has not had time for exercise  over the previous several months due to adjusting to caring for elderly parents (together they have suffered three strokes, gall bladder blockage, and stent placement).  Has started walking a little lately, walks dog twice weekly, 15-20 minute walk, but this walk is slow with several stops as the dog is advanced in age.  We have discussed walking an extra lap without the dog in order to walk faster.  We have also discussed adding mild weight training once weekly to improve muscle tone and hopefully enhance weight loss.  I have also encouraged patient to walk for at least 30-45 minutes on one day of the week to enhance fat burning.    Nutrition-  Patient reports portions have become better controlled and she now finds herself filling full faster. She continues eating out often as she cares for her parents and they are not able to cook for themselves.  They often eat at [employer] and order vegetable plates.  If they eat fast food it is almost always [employer]-fil-a.  She does enjoy diet sodas and has recently switched to diet sprite to avoid caffeine.  She will make an effort to avoid soda altogether.  And instead drink more water.   Goals for Next Visit:  1)  Attempt to avoid drinking sodas and instead drink water 2)  Investigate kidney friendly diet, including low sodium, low phosphorus, etc. 3)  Explore the [employer] for Nutrition info: 2)  Continue exercising and attempt  to add at least one additional day of exercise, try to exceed 30 minutes on one day per week to enhance fat burning.  Consider adding mild weight lifting 1-2 days per week to tone muscle and enhance calorie burning.  3)  Continue testing as needed 4)  Continue to be compliant with medications 5)  Follow-up on Tuesday November 14th @ 11:00 am   Great to see you today!

## 2015-10-10 NOTE — Progress Notes (Signed)
Subjective:    Patient ID: Samantha Hahn, female    DOB: 1960-10-12, 55 y.o.   MRN: [id number]  HPI 55 year old White Female in today for health maintenance exam and evaluation of medical issues. History of diabetes mellitus. Has seen Dr. Elyse Hahn in the past but not recently.   She has a history of migraine headaches, essential hypertension, asthma, depression, insomnia, allergic rhinitis, acne rosacea, history of sleep apnea, bipolar disorder and PVCs.  She had a vaginal hysterectomy in 2006. After that she had consult will gastroenterologist for diarrhea and nausea weight loss and abdominal bloating. Symptoms seemed to start after surgery. She was treated with Nexium and was treated with Flagyl for 5 days with improvement.  History of allergic rhinitis with positive skin test to grasses, weeds, trees, dog and house dust mite. This was done by Dr. Neldon Hahn in 2001. She also has barometer he showing small airways disease. Following albuterol, FEV1 did not change significantly. She has been diagnosed with asthma.  History of GE reflux with component of reflux-induced respiratory disease. Has been placed on Pulmicort and Rhinocort  as well as Allegra-D and Patanol eyedrops.  In 2011, she fell at home and had brief loss of consciousness. Was diagnosed with minor head injury. CT of the spine and head were normal.  Patient has GYN physician.  She had colonoscopy in 2013 and had adenomatous colon polyp. Colonoscopy was done by Dr. Henrene Hahn.  Has influenza vaccine through employment.  In 2013 she had adhesive capsulitis of the left shoulder and had left shoulder release by Dr. supple.  Social history: She is divorced and has a Samantha Hahn. Does not smoke or consume alcohol. She has one adult daughter. 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 Stress with caring for elderly parents.  Family history: Brother with history of hypertrophic cardiomyopathy. One sister in good health. Mother  with history of stroke, heart disease, and kidney stones as well as diabetes. Father with history of diabetes.  Does have history of elevated serum creatinine and was referred to nephrologist. Seems to have some mild chronic kidney disease. This is been stable.    Review of Systems  Constitutional: Positive for fatigue.  Respiratory:       History of asthma  Cardiovascular: Negative.   Genitourinary: Negative.   Musculoskeletal: Negative.   Neurological:       History of migraine headaches       Objective:   Physical Exam  Constitutional: She is oriented to person, place, and time. She appears well-developed and well-nourished. No distress.  HENT:  Head: Normocephalic and atraumatic.  Right Ear: External ear normal.  Left Ear: External ear normal.  Mouth/Throat: Oropharynx is clear and moist. No oropharyngeal exudate.  Eyes: Conjunctivae are normal. Pupils are equal, round, and reactive to light. Right eye exhibits no discharge. Left eye exhibits no discharge.  Neck: Neck Samantha Hahn. No JVD present. No thyromegaly present.  Cardiovascular: Normal rate, normal heart sounds and intact distal pulses.   No murmur heard. Pulmonary/Chest: Effort normal and breath sounds normal. She has no wheezes. She has no rales.  Breasts normal female  Abdominal: Soft. Bowel sounds are normal. She exhibits no distension and no mass. There is no tenderness. There is no rebound and no guarding.  Genitourinary:  Genitourinary Comments: Pap deferred status post vaginal hysterectomy. Had negative Pap smear 2013. GYN care done by Samantha Dimmer, PA  Musculoskeletal: She exhibits no edema.  Lymphadenopathy:    She  has no cervical adenopathy.  Neurological: She is alert and oriented to person, place, and time.  Skin: Skin is warm and dry. No rash noted. She is not diaphoretic.  Psychiatric: She has a normal mood and affect. Her behavior is normal. Judgment and thought content normal.  Vitals  reviewed.         Assessment & Plan:  Controlled type 2 diabetes mellitus  Asthma  Essential hypertension-stable  Sleep apnea  History of bipolar disorder-stable  History of  migraine headaches  History of allergic rhinitis  GE reflux-stable  History of adenomatous colon polyp  History of fall and fractured fibula-healing  Chronic kidney disease stage III  followed by nephrology. Has been told to avoid ibuprofen. Last seen 07/28/2015 at [facility]. GFR estimated 40-45 mL/m. Followed by Samantha Hahn yearly.  Plan: Return in 6 months or as needed.

## 2015-10-26 ENCOUNTER — Ambulatory Visit: Payer: Self-pay

## 2015-11-01 MED FILL — PIOGLITAZONE HCL 45 MG TAB: 45 | 90 days supply | Qty: 90 | Fill #1

## 2015-11-04 MED FILL — BUPROPION HCL SR 200 MG TAB: 200 | 30 days supply | Qty: 30 | Fill #2

## 2015-11-05 MED FILL — glipiZIDE XL 10 MG TB24: 10 | 90 days supply | Qty: 90 | Fill #2

## 2015-11-11 ENCOUNTER — Other Ambulatory Visit: Payer: Self-pay | Admitting: Internal Medicine

## 2015-11-11 MED FILL — metFORMIN HCL 1000 MG TABS: 1000 | 90 days supply | Qty: 180 | Fill #0

## 2015-11-11 MED FILL — DOXYCYCLINE HYCLATE 100 MG: 100 | 90 days supply | Qty: 90 | Fill #0

## 2015-11-11 MED FILL — ATORVASTATIN 20 MG TABLET: 20 | 90 days supply | Qty: 90 | Fill #0

## 2015-11-19 ENCOUNTER — Encounter: Payer: 59 | Admitting: Physician Assistant

## 2015-11-19 DIAGNOSIS — R51 Headache: Secondary | ICD-10-CM

## 2015-11-25 MED FILL — BUPROPION HCL SR 200 MG TAB: 200 | 30 days supply | Qty: 30 | Fill #3

## 2015-12-02 ENCOUNTER — Ambulatory Visit: Payer: Self-pay

## 2015-12-13 ENCOUNTER — Other Ambulatory Visit: Payer: Self-pay | Admitting: Internal Medicine

## 2015-12-13 MED FILL — MONTELUKAST SOD 10 MG TAB: 10 | 90 days supply | Qty: 90 | Fill #0

## 2015-12-20 ENCOUNTER — Other Ambulatory Visit: Payer: Self-pay | Admitting: *Deleted

## 2015-12-20 MED ORDER — JANUVIA 100 MG PO TABS: 100.0000 mg | ORAL_TABLET | Freq: Every day | ORAL | 3 refills | Status: DC

## 2015-12-20 MED FILL — METOPROLOL SUCC ER 50 MG TA: 50 | 90 days supply | Qty: 90 | Fill #1

## 2015-12-20 MED FILL — JANUVIA 100 MG TABLET: 100 | 90 days supply | Qty: 90 | Fill #0

## 2015-12-23 ENCOUNTER — Other Ambulatory Visit: Payer: Self-pay | Admitting: Allergy and Immunology

## 2015-12-23 MED FILL — SYMBICORT 160-4.5 MCG INH: 160-4.5 | 30 days supply | Qty: 10 | Fill #1

## 2015-12-23 MED FILL — VENTOLIN HFA 90 MCG INHALER: 108 (90 BAS | 17 days supply | Qty: 18 | Fill #0

## 2015-12-23 MED FILL — QNASL 80 MCG NASAL SPRAY: 80 | 30 days supply | Qty: 9 | Fill #1

## 2016-01-03 MED FILL — BUPROPION HCL SR 200 MG TAB: 200 | 30 days supply | Qty: 30 | Fill #4

## 2016-01-04 ENCOUNTER — Ambulatory Visit: Payer: 59 | Admitting: Internal Medicine

## 2016-01-04 ENCOUNTER — Ambulatory Visit: Payer: Self-pay | Admitting: Pharmacist

## 2016-01-06 ENCOUNTER — Ambulatory Visit (INDEPENDENT_AMBULATORY_CARE_PROVIDER_SITE_OTHER): Payer: 59 | Admitting: Internal Medicine

## 2016-01-06 ENCOUNTER — Encounter: Payer: Self-pay | Admitting: Internal Medicine

## 2016-01-06 VITALS — BP 128/78 | HR 73 | Temp 98.5°F | Ht 68.0 in | Wt 248.0 lb

## 2016-01-06 DIAGNOSIS — I10 Essential (primary) hypertension: Secondary | ICD-10-CM

## 2016-01-06 DIAGNOSIS — J069 Acute upper respiratory infection, unspecified: Secondary | ICD-10-CM | POA: Diagnosis not present

## 2016-01-06 DIAGNOSIS — N183 Chronic kidney disease, stage 3 unspecified: Secondary | ICD-10-CM

## 2016-01-06 DIAGNOSIS — J9801 Acute bronchospasm: Secondary | ICD-10-CM | POA: Diagnosis not present

## 2016-01-06 DIAGNOSIS — E119 Type 2 diabetes mellitus without complications: Secondary | ICD-10-CM

## 2016-01-06 MED ORDER — HYDROCODONE-HOMATROPINE 5-1.5 MG/5ML PO SYRP: 5.0000 mL | ORAL_SOLUTION | Freq: Three times a day (TID) | ORAL | 0 refills | Status: DC | PRN

## 2016-01-06 MED ORDER — PREDNISONE 10 MG PO TABS: ORAL_TABLET | ORAL | 0 refills | Status: DC

## 2016-01-06 MED ORDER — LEVOFLOXACIN 500 MG PO TABS: 500.0000 mg | ORAL_TABLET | Freq: Every day | ORAL | 0 refills | Status: DC

## 2016-01-06 MED FILL — HYDROCODONE-HOMATROPINE SYR: 5-1.5 | 8 days supply | Qty: 120 | Fill #0

## 2016-01-06 MED FILL — levoFLOXacin 500 MG TABS: 500 | 10 days supply | Qty: 10 | Fill #0

## 2016-01-06 MED FILL — predniSONE 10 MG TABS: 10 | 6 days supply | Qty: 21 | Fill #0

## 2016-01-11 ENCOUNTER — Encounter: Payer: Self-pay | Admitting: Physician Assistant

## 2016-01-11 ENCOUNTER — Ambulatory Visit (INDEPENDENT_AMBULATORY_CARE_PROVIDER_SITE_OTHER): Payer: 59 | Admitting: Physician Assistant

## 2016-01-11 VITALS — BP 126/83 | HR 71 | Resp 20 | Ht 67.0 in | Wt 244.0 lb

## 2016-01-11 DIAGNOSIS — G43109 Migraine with aura, not intractable, without status migrainosus: Secondary | ICD-10-CM | POA: Diagnosis not present

## 2016-01-11 DIAGNOSIS — F329 Major depressive disorder, single episode, unspecified: Secondary | ICD-10-CM

## 2016-01-11 DIAGNOSIS — F32A Depression, unspecified: Secondary | ICD-10-CM

## 2016-01-11 MED ORDER — GABAPENTIN 100 MG PO CAPS: 100.0000 mg | ORAL_CAPSULE | Freq: Three times a day (TID) | ORAL | 2 refills | Status: DC

## 2016-01-11 MED ORDER — BUPROPION HCL ER (SR) 200 MG PO TB12: 200.0000 mg | ORAL_TABLET | Freq: Every day | ORAL | 1 refills | Status: DC

## 2016-01-11 MED FILL — GABAPENTIN 100 MG CAPSULE: 100 | 90 days supply | Qty: 270 | Fill #0

## 2016-01-11 NOTE — Patient Instructions (Signed)
Migraine Headache A migraine headache is an intense, throbbing pain on one side or both sides of the head. Migraines may also cause other symptoms, such as nausea, vomiting, and sensitivity to light and noise. What are the causes? Doing or taking certain things may also trigger migraines, such as:  Alcohol.  Smoking.  Medicines, such as: ? Medicine used to treat chest pain (nitroglycerine). ? Birth control pills. ? Estrogen pills. ? Certain blood pressure medicines.  Aged cheeses, chocolate, or caffeine.  Foods or drinks that contain nitrates, glutamate, aspartame, or tyramine.  Physical activity.  Other things that may trigger a migraine include:  Menstruation.  Pregnancy.  Hunger.  Stress, lack of sleep, too much sleep, or fatigue.  Weather changes.  What increases the risk? The following factors may make you more likely to experience migraine headaches:  Age. Risk increases with age.  Family history of migraine headaches.  Being Caucasian.  Depression and anxiety.  Obesity.  Being a woman.  Having a hole in the heart (patent foramen ovale) or other heart problems.  What are the signs or symptoms? The main symptom of this condition is pulsating or throbbing pain. Pain may:  Happen in any area of the head, such as on one side or both sides.  Interfere with daily activities.  Get worse with physical activity.  Get worse with exposure to bright lights or loud noises.  Other symptoms may include:  Nausea.  Vomiting.  Dizziness.  General sensitivity to bright lights, loud noises, or smells.  Before you get a migraine, you may get warning signs that a migraine is developing (aura). An aura may include:  Seeing flashing lights or having blind spots.  Seeing bright spots, halos, or zigzag lines.  Having tunnel vision or blurred vision.  Having numbness or a tingling feeling.  Having trouble talking.  Having muscle weakness.  How is this  diagnosed? A migraine headache can be diagnosed based on:  Your symptoms.  A physical exam.  Tests, such as CT scan or MRI of the head. These imaging tests can help rule out other causes of headaches.  Taking fluid from the spine (lumbar puncture) and analyzing it (cerebrospinal fluid analysis, or CSF analysis).  How is this treated? A migraine headache is usually treated with medicines that:  Relieve pain.  Relieve nausea.  Prevent migraines from coming back.  Treatment may also include:  Acupuncture.  Lifestyle changes like avoiding foods that trigger migraines.  Follow these instructions at home: Medicines  Take over-the-counter and prescription medicines only as told by your health care provider.  Do not drive or use heavy machinery while taking prescription pain medicine.  To prevent or treat constipation while you are taking prescription pain medicine, your health care provider may recommend that you: ? Drink enough fluid to keep your urine clear or pale yellow. ? Take over-the-counter or prescription medicines. ? Eat foods that are high in fiber, such as fresh fruits and vegetables, whole grains, and beans. ? Limit foods that are high in fat and processed sugars, such as fried and sweet foods. Lifestyle  Avoid alcohol use.  Do not use any products that contain nicotine or tobacco, such as cigarettes and e-cigarettes. If you need help quitting, ask your health care provider.  Get at least 8 hours of sleep every night.  Limit your stress. General instructions   Keep a journal to find out what may trigger your migraine headaches. For example, write down: ? What you eat and   drink. ? How much sleep you get. ? Any change to your diet or medicines.  If you have a migraine: ? Avoid things that make your symptoms worse, such as bright lights. ? It may help to lie down in a dark, quiet room. ? Do not drive or use heavy machinery. ? Ask your health care provider  what activities are safe for you while you are experiencing symptoms.  Keep all follow-up visits as told by your health care provider. This is important. Contact a health care provider if:  You develop symptoms that are different or more severe than your usual migraine symptoms. Get help right away if:  Your migraine becomes severe.  You have a fever.  You have a stiff neck.  You have vision loss.  Your muscles feel weak or like you cannot control them.  You start to lose your balance often.  You develop trouble walking.  You faint. This information is not intended to replace advice given to you by your health care provider. Make sure you discuss any questions you have with your health care provider. Document Released: 02/06/2005 Document Revised: 08/27/2015 Document Reviewed: 07/26/2015 Elsevier Interactive Patient Education  2017 Elsevier Inc.   

## 2016-01-11 NOTE — Progress Notes (Signed)
History:  Samantha Hahn is a 55 y.o. who presents to clinic today for HA eval.  She has noticed improvement.  No severe headaches until last Tuesday.  She took Hydrocodone and phenergan and went to bed.  She felt better the following day.  She feels they are generally not as severe but with the same characteristics.  She is taking neurontin twice daily as a routine and adding a third when she has pain.   Her depression is worsening.  She sometimes feels she cannot get out of bed.   She is now seeing an EAP counselor whom has recommended increasing/adding depression med.     HIT6:48 Number of days in the last 4 weeks with:  Severe headache: 1 Moderate headache: 7 Mild headache: 8  No headache: 12   Past Medical History:  Diagnosis Date  . Abnormal vaginal Pap smear   . Acne rosacea   . Allergy   . Anemia   . Anxiety   . Asthma with COPD with status asthmaticus ([place])   . Depression    resolved  . Diabetes mellitus   . Dysrhythmia    pvc  . GERD (gastroesophageal reflux disease)   . Hot flashes   . Hypertension     resolved,     dr hochrein  . Migraines   . Sleep apnea    workup still in process    Social History   Social History  . Marital status: Divorced    Spouse name: N/A  . Number of children: N/A  . Years of education: N/A   Occupational History  . [employer]   Social History Main Topics  . Smoking status: Never Smoker  . Smokeless tobacco: Never Used  . Alcohol use Yes     Comment: occ  . Drug use: No  . Sexual activity: Not Currently    Birth control/ protection: Surgical   Other Topics Concern  . Not on file   Social History Narrative  . No narrative on file    Family History  Problem Relation Age of Onset  . Heart disease Mother 26    CAD  . Asthma Mother   . Dementia Mother   . Stroke Mother   . Asthma Sister   . Heart Problems Father   . Colon cancer Neg Hx   . Esophageal cancer Neg Hx   . Rectal cancer Neg Hx   . Stomach cancer  Neg Hx     Allergies  Allergen Reactions  . Clarithromycin Hives  . Vicodin [Hydrocodone-Acetaminophen] Nausea And Vomiting  . Exenatide Other (See Comments)    Rash & itching at injection site    Current Outpatient Prescriptions on File Prior to Visit  Medication Sig Dispense Refill  . albuterol (PROVENTIL HFA;VENTOLIN HFA) 108 (90 BASE) MCG/ACT inhaler Inhale 2 puffs into the lungs every 6 (six) hours as needed. For shortness of breath    . aspirin 325 MG tablet Take 325 mg by mouth daily.    Marland Kitchen atorvastatin (LIPITOR) 20 MG tablet TAKE 1 TABLET BY MOUTH DAILY. 90 tablet 3  . b complex vitamins tablet Take 1 tablet by mouth daily.    Marland Kitchen buPROPion (WELLBUTRIN SR) 200 MG 12 hr tablet Take 1 tablet (200 mg total) by mouth daily. 30 tablet 5  . Calcium-Vitamin D 600-200 MG-UNIT per tablet Take 1 tablet by mouth daily.    . cholecalciferol (VITAMIN D) 1000 units tablet Take 1,000 Units by mouth daily.    Marland Kitchen  doxycycline (VIBRA-TABS) 100 MG tablet TAKE 1 TABLET BY MOUTH DAILY. 90 tablet 1  . ferrous fumarate (HEMOCYTE - 106 MG FE) 325 (106 FE) MG TABS Take 1 tablet by mouth daily.    Marland Kitchen gabapentin (NEURONTIN) 100 MG capsule Take 1 capsule (100 mg total) by mouth 3 (three) times daily. 270 capsule 2  . glipiZIDE (GLUCOTROL XL) 10 MG 24 hr tablet Take 1 tablet (10 mg total) by mouth daily. 90 tablet 3  . HYDROcodone-acetaminophen (NORCO/VICODIN) 5-325 MG tablet Take 1 tablet by mouth every 6 (six) hours as needed for moderate pain. Reported on 04/20/2015 30 tablet 0  . HYDROcodone-homatropine (HYCODAN) 5-1.5 MG/5ML syrup Take 5 mLs by mouth every 8 (eight) hours as needed for cough. 120 mL 0  . JANUVIA 100 MG tablet Take 1 tablet (100 mg total) by mouth daily. 90 tablet 3  . levofloxacin (LEVAQUIN) 500 MG tablet Take 1 tablet (500 mg total) by mouth daily. 10 tablet 0  . loratadine (CLARITIN) 10 MG tablet Take 10 mg by mouth daily after breakfast. For allergies    . metFORMIN (GLUCOPHAGE) 1000 MG  tablet TAKE 1 TABLET BY MOUTH TWICE DAILY 180 tablet PRN  . metoprolol succinate (TOPROL-XL) 50 MG 24 hr tablet TAKE 1 TABLET BY MOUTH ONCE DAILY 90 tablet 3  . metroNIDAZOLE (METROCREAM) 0.75 % cream Apply topically 2 (two) times daily. To face 45 g prn  . montelukast (SINGULAIR) 10 MG tablet TAKE 1 TABLET BY MOUTH EVERY MORNING. 90 tablet 1  . multivitamin (THERAGRAN) per tablet Take 1 tablet by mouth daily.      . pioglitazone (ACTOS) 45 MG tablet TAKE 1 TABLET BY MOUTH ONCE DAILY 90 tablet 3  . promethazine (PHENERGAN) 25 MG tablet Take 1 tablet (25 mg total) by mouth every 8 (eight) hours as needed for nausea or vomiting. 30 tablet 3  . QNASL 80 MCG/ACT AERS PLACE 1 SPRAYS IN EACH NOSTRIL ONCE DAILY FOR STUFFY NOSE OR DRAINAGE 8.7 g 11  . SYMBICORT 160-4.5 MCG/ACT inhaler INHALE 2 PUFFS INTO THE LUNGS 2 (TWO) TIMES DAILY. 10.2 g 11  . TRUEPLUS LANCETS 30G MISC TEST BLOOD GLUCOSE TWICE DAILY 200 each 3  . TRUETEST TEST test strip TEST TWICE DAILY AS NEEDED 200 each 6  . eszopiclone (LUNESTA) 1 MG TABS tablet Take 1 tablet (1 mg total) by mouth at bedtime as needed for sleep. Take immediately before bedtime (Patient not taking: Reported on 01/11/2016) 30 tablet 1   No current facility-administered medications on file prior to visit.      Review of Systems:  All pertinent positive/negative included in HPI, all other review of systems are negative   Objective:  Physical Exam BP 126/83 (BP Location: Left Arm, Patient Position: Sitting, Cuff Size: Large)   Pulse 71   Resp 20   Ht 5\' 7"  (1.702 m)   Wt 244 lb (110.7 kg)   BMI 38.22 kg/m  CONSTITUTIONAL: Well-developed, well-nourished female in no acute distress.  EYES: EOM intact ENT: Normocephalic CARDIOVASCULAR: Regular rate .  RESPIRATORY: Normal rate.  MUSCULOSKELETAL: Normal ROM SKIN: Warm, dry without erythema  NEUROLOGICAL: Alert, oriented, CN II-XII grossly intact, Appropriate balance  PSYCH: Normal behavior,  mood   Assessment & Plan:  Assessment: 1. Migraine with aura and without status migrainosus, not intractable   2. Depression, unspecified depression type    Migraines are stable Depression is worsening  Plan: Increase gabapentin to 2 at night and one in am.  Continue Wellbutrin at 200  for now and talk to PCP regarding possible change.  May consider Effexor to help with both depression and migraine.   Follow-up in 6 months or sooner PRN  Paticia Stack, PA-C 01/11/2016 9:58 AM

## 2016-01-20 NOTE — Patient Instructions (Signed)
Take doxycycline 100 mg twice daily for 10 days. Use Proventil inhaler. Hycodan 1 teaspoon by mouth nightly 8 hours when necessary cough. Rest and drink plenty of fluids.

## 2016-01-20 NOTE — Progress Notes (Signed)
   Subjective:    Patient ID: Samantha Hahn, female    DOB: 11-07-1960, 55 y.o.   MRN: [id number]  HPI 55 year old female has come down with acute respiratory infection with wheezing. No fever or shaking chills. Coughing a lot. Has malaise and fatigue.    Review of Systems     Objective:   Physical Exam  Skin warm and dry. Nodes none. Neck is supple without adenopathy. Chest occasional scattered wheezing. No rales. Extremities without edema. Cardiac exam regular rate and rhythm normal S1 and S2.      Assessment & Plan:  Acute URI  Acute bronchospasm  Plan: Doxycycline 100 mg twice daily for 10 days. Hycodan 1 teaspoon by mouth every 8 hours when necessary cough. Proventil inhaler 2 sprays by mouth 4 times daily for bronchospasm.

## 2016-02-03 ENCOUNTER — Ambulatory Visit: Payer: Self-pay

## 2016-02-03 MED FILL — BUPROPION HCL SR 200 MG TAB: 200 | 90 days supply | Qty: 90 | Fill #0

## 2016-02-08 MED FILL — glipiZIDE XL 10 MG TB24: 10 | 90 days supply | Qty: 90 | Fill #3

## 2016-02-08 MED FILL — PIOGLITAZONE HCL 45 MG TAB: 45 | 90 days supply | Qty: 90 | Fill #2

## 2016-03-01 ENCOUNTER — Encounter: Payer: Self-pay | Admitting: *Deleted

## 2016-03-14 MED FILL — SYMBICORT 160-4.5 MCG INH: 160-4.5 | 30 days supply | Qty: 10 | Fill #2

## 2016-03-14 MED FILL — metFORMIN HCL 1000 MG TABS: 1000 | 90 days supply | Qty: 180 | Fill #1

## 2016-03-14 MED FILL — ATORVASTATIN 20 MG TABLET: 20 | 90 days supply | Qty: 90 | Fill #1

## 2016-03-14 MED FILL — DOXYCYCLINE HYCLATE 100 MG: 100 | 90 days supply | Qty: 90 | Fill #1

## 2016-03-20 MED FILL — MONTELUKAST SOD 10 MG TAB: 10 | 90 days supply | Qty: 90 | Fill #1

## 2016-03-28 ENCOUNTER — Other Ambulatory Visit: Payer: 59 | Admitting: Internal Medicine

## 2016-03-29 MED FILL — JANUVIA 100 MG TABLET: 100 | 90 days supply | Qty: 90 | Fill #1

## 2016-03-29 MED FILL — METOPROLOL SUCC ER 50 MG TA: 50 | 90 days supply | Qty: 90 | Fill #2

## 2016-03-30 ENCOUNTER — Ambulatory Visit: Payer: 59 | Admitting: Internal Medicine

## 2016-03-30 ENCOUNTER — Other Ambulatory Visit: Payer: 59 | Admitting: Internal Medicine

## 2016-03-30 DIAGNOSIS — E118 Type 2 diabetes mellitus with unspecified complications: Secondary | ICD-10-CM | POA: Diagnosis not present

## 2016-03-30 DIAGNOSIS — Z79899 Other long term (current) drug therapy: Secondary | ICD-10-CM

## 2016-03-30 DIAGNOSIS — Z13220 Encounter for screening for lipoid disorders: Secondary | ICD-10-CM | POA: Diagnosis not present

## 2016-03-30 DIAGNOSIS — I10 Essential (primary) hypertension: Secondary | ICD-10-CM

## 2016-03-30 LAB — LIPID PANEL
Cholesterol: 112 mg/dL (ref <200)
HDL: 37 mg/dL — AB (ref >50)
LDL Cholesterol: 39 mg/dL (ref <100)
TRIGLYCERIDES: 180 mg/dL — AB (ref <150)
Total CHOL/HDL Ratio: 3 Ratio (ref <5.0)
VLDL: 36 mg/dL — AB (ref <30)

## 2016-03-30 LAB — MICROALBUMIN / CREATININE URINE RATIO
CREATININE, URINE: 68 mg/dL (ref 20–320)
MICROALB UR: 0.5 mg/dL
Microalb Creat Ratio: 7 mcg/mg creat (ref <30)

## 2016-03-30 LAB — BASIC METABOLIC PANEL
BUN: 30 mg/dL — AB (ref 7–25)
CALCIUM: 8.7 mg/dL (ref 8.6–10.4)
CO2: 26 mmol/L (ref 20–31)
Chloride: 105 mmol/L (ref 98–110)
Creat: 1.58 mg/dL — ABNORMAL HIGH (ref 0.50–1.05)
GLUCOSE: 143 mg/dL — AB (ref 65–99)
POTASSIUM: 4.8 mmol/L (ref 3.5–5.3)
Sodium: 141 mmol/L (ref 135–146)

## 2016-03-30 LAB — HEPATIC FUNCTION PANEL
ALBUMIN: 3.7 g/dL (ref 3.6–5.1)
ALK PHOS: 63 U/L (ref 33–130)
ALT: 10 U/L (ref 6–29)
AST: 17 U/L (ref 10–35)
BILIRUBIN INDIRECT: 0.5 mg/dL (ref 0.2–1.2)
BILIRUBIN TOTAL: 0.6 mg/dL (ref 0.2–1.2)
Bilirubin, Direct: 0.1 mg/dL (ref <=0.2)
TOTAL PROTEIN: 6.3 g/dL (ref 6.1–8.1)

## 2016-03-30 NOTE — Addendum Note (Signed)
Addended by: Inocencio Homes on: 03/30/2016 09:41 AM   Modules accepted: Orders

## 2016-03-31 LAB — HEMOGLOBIN A1C
Hgb A1c MFr Bld: 6.4 % — ABNORMAL HIGH (ref <5.7)
MEAN PLASMA GLUCOSE: 137 mg/dL

## 2016-04-03 ENCOUNTER — Ambulatory Visit: Payer: 59 | Admitting: Internal Medicine

## 2016-04-14 ENCOUNTER — Ambulatory Visit: Payer: 59 | Admitting: Internal Medicine

## 2016-04-14 DIAGNOSIS — Z029 Encounter for administrative examinations, unspecified: Secondary | ICD-10-CM

## 2016-04-21 ENCOUNTER — Ambulatory Visit (INDEPENDENT_AMBULATORY_CARE_PROVIDER_SITE_OTHER): Payer: 59 | Admitting: Internal Medicine

## 2016-04-21 ENCOUNTER — Encounter: Payer: Self-pay | Admitting: Internal Medicine

## 2016-04-21 VITALS — BP 110/70 | HR 72 | Temp 97.2°F | Ht 67.0 in | Wt 234.0 lb

## 2016-04-21 DIAGNOSIS — Z0000 Encounter for general adult medical examination without abnormal findings: Secondary | ICD-10-CM | POA: Diagnosis not present

## 2016-04-21 DIAGNOSIS — F3170 Bipolar disorder, currently in remission, most recent episode unspecified: Secondary | ICD-10-CM | POA: Diagnosis not present

## 2016-04-21 DIAGNOSIS — Z8659 Personal history of other mental and behavioral disorders: Secondary | ICD-10-CM

## 2016-04-21 DIAGNOSIS — Z8669 Personal history of other diseases of the nervous system and sense organs: Secondary | ICD-10-CM | POA: Diagnosis not present

## 2016-04-21 DIAGNOSIS — J069 Acute upper respiratory infection, unspecified: Secondary | ICD-10-CM | POA: Diagnosis not present

## 2016-04-21 DIAGNOSIS — E119 Type 2 diabetes mellitus without complications: Secondary | ICD-10-CM

## 2016-04-21 DIAGNOSIS — E781 Pure hyperglyceridemia: Secondary | ICD-10-CM

## 2016-04-21 DIAGNOSIS — N183 Chronic kidney disease, stage 3 unspecified: Secondary | ICD-10-CM

## 2016-04-21 MED ORDER — ESCITALOPRAM OXALATE 10 MG PO TABS: 10.0000 mg | ORAL_TABLET | Freq: Every day | ORAL | 1 refills | Status: DC

## 2016-04-21 MED ORDER — BUPROPION HCL ER (XL) 300 MG PO TB24: 300.0000 mg | ORAL_TABLET | Freq: Every day | ORAL | 0 refills | Status: DC

## 2016-04-21 MED FILL — BUPROPION HCL XL 300 MG TAB: 300 | 90 days supply | Qty: 90 | Fill #0

## 2016-04-21 MED FILL — ESCITALOPRAM 10 MG TABLET: 10 | 30 days supply | Qty: 30 | Fill #0

## 2016-04-21 NOTE — Progress Notes (Signed)
   Subjective:    Patient ID: Samantha Hahn, female    DOB: 31-Dec-1960, 56 y.o.   MRN: [id number]  HPI 56 year old Female with history of bipolar disorder and depression for follow-up on Diabetes mellitus and other issues.   Has had recent URI symptoms with cough and congestion.  Amended about annual diabetic eye exam.  Hemoglobin A1c in February was 6.4% and previously was 6.9%. Liver panel normal. Triglycerides elevated at 180 and previously were 151. Total cholesterol and LDL cholesterol normal. Creatinine 1.58 previously was 1.64.  History of migraine headaches. Seen in November 2017 OB/GYN with severe migraine.  Patient is on Wellbutrin for depression.  Her diabetes she takes Glucotrol XL 10 mg daily. For hyperlipidemia she is on Lipitor 20 mg daily. She is also on Januvia. Also takes Glucophage 1000 mg twice daily.  History of asthma for which she uses when necessary Proventil inhaler. She also is on Singulair.  Because she is a diabetic she is on aspirin 325 mg daily.  Takes hydrocodone/APAP sparingly for migraine. Takes Lunesta for sleep. headaches.  For rosacea she takes doxycycline daily.    Review of Systems cough and congestion but no myalgias     Objective:   Physical Exam Skin warm and dry. Nodes none. Pharynx is clear. TMs are clear. Neck is supple. Chest clear to auscultation without rales or wheezing.       Assessment & Plan:  Controlled type 2 diabetes mellitus-enrolled in [facility] [place] program at [facility]  History of bipolar disorder and depression-stable  History of migraine headaches  History of asthma  Chronic kidney disease followed by [facility]  Hypertriglyceridemia- encouraged diet and exercise. Continue Lipitor.  Acute URI-Hycodan 1 teaspoon by mouth every 8 hours when necessary cough. Levaquin 500 milligrams daily for 10 days.  Return in 6 months for physical examination.

## 2016-05-06 NOTE — Patient Instructions (Signed)
Please work on diet and exercise and weight loss. Continue same medications. Take Levaquin and Hycodan as directed for respiratory infection. Return in 6 months for physical examination.

## 2016-05-16 ENCOUNTER — Other Ambulatory Visit: Payer: Self-pay

## 2016-05-16 ENCOUNTER — Other Ambulatory Visit: Payer: Self-pay | Admitting: Internal Medicine

## 2016-05-16 ENCOUNTER — Ambulatory Visit
Admission: RE | Admit: 2016-05-16 | Discharge: 2016-05-16 | Disposition: A | Discharge status: Home / Self Care | Payer: 59 | Source: Ambulatory Visit

## 2016-05-16 DIAGNOSIS — Z1231 Encounter for screening mammogram for malignant neoplasm of breast: Secondary | ICD-10-CM

## 2016-05-16 DIAGNOSIS — E119 Type 2 diabetes mellitus without complications: Secondary | ICD-10-CM

## 2016-05-16 MED ORDER — ACCU-CHEK FASTCLIX LANCETS MISC: 12 refills | Status: DC

## 2016-05-16 MED ORDER — GLUCOSE BLOOD VI STRP: ORAL_STRIP | 12 refills | Status: DC

## 2016-05-16 MED FILL — PIOGLITAZONE HCL 45 MG TAB: 45 | 90 days supply | Qty: 90 | Fill #3

## 2016-05-16 MED FILL — glipiZIDE XL 10 MG TB24: 10 | 90 days supply | Qty: 90 | Fill #0

## 2016-05-24 ENCOUNTER — Ambulatory Visit: Payer: 59 | Admitting: Internal Medicine

## 2016-05-24 MED FILL — ESCITALOPRAM 10 MG TABLET: 10 | 30 days supply | Qty: 30 | Fill #1

## 2016-05-26 MED FILL — ACCU-CHEK FASTCLIX LANCETS: 50 days supply | Qty: 102 | Fill #0

## 2016-05-26 MED FILL — ACCU-CHEK GUIDE TEST STRIP: 50 days supply | Qty: 100 | Fill #0

## 2016-06-19 ENCOUNTER — Other Ambulatory Visit: Payer: Self-pay

## 2016-06-19 MED ORDER — DOXYCYCLINE HYCLATE 100 MG PO TABS: 100.0000 mg | ORAL_TABLET | Freq: Every day | ORAL | 99 refills | Status: DC

## 2016-06-19 MED FILL — ATORVASTATIN 20 MG TABLET: 20 | 90 days supply | Qty: 90 | Fill #2

## 2016-06-19 MED FILL — DOXYCYCLINE HYCLATE 100 MG: 100 | 90 days supply | Qty: 90 | Fill #0

## 2016-06-19 MED FILL — metFORMIN HCL 1000 MG TABS: 1000 | 90 days supply | Qty: 180 | Fill #2

## 2016-07-04 ENCOUNTER — Telehealth: Payer: Self-pay

## 2016-07-04 MED ORDER — ESCITALOPRAM OXALATE 10 MG PO TABS: 10.0000 mg | ORAL_TABLET | Freq: Every day | ORAL | 3 refills | Status: DC

## 2016-07-04 MED FILL — ESCITALOPRAM 10 MG TABLET: 10 | 90 days supply | Qty: 90 | Fill #0

## 2016-07-04 NOTE — Telephone Encounter (Signed)
Received fax from [place] for refill on Lexapro. Dr. Renold Genta ok'd for one year.

## 2016-07-07 ENCOUNTER — Other Ambulatory Visit: Payer: Self-pay | Admitting: Internal Medicine

## 2016-07-07 MED FILL — JANUVIA 100 MG TABLET: 100 | 90 days supply | Qty: 90 | Fill #2

## 2016-07-07 MED FILL — MONTELUKAST SOD 10 MG TAB: 10 | 90 days supply | Qty: 90 | Fill #0

## 2016-07-26 MED FILL — SYMBICORT 160-4.5 MCG INH: 160-4.5 | 30 days supply | Qty: 10 | Fill #3

## 2016-07-26 MED FILL — METOPROLOL SUCC ER 50 MG TA: 50 | 90 days supply | Qty: 90 | Fill #3

## 2016-08-14 DIAGNOSIS — E669 Obesity, unspecified: Secondary | ICD-10-CM | POA: Diagnosis not present

## 2016-08-14 DIAGNOSIS — N183 Chronic kidney disease, stage 3 (moderate): Secondary | ICD-10-CM | POA: Diagnosis not present

## 2016-08-14 DIAGNOSIS — I10 Essential (primary) hypertension: Secondary | ICD-10-CM | POA: Diagnosis not present

## 2016-08-14 DIAGNOSIS — E119 Type 2 diabetes mellitus without complications: Secondary | ICD-10-CM | POA: Diagnosis not present

## 2016-08-14 MED FILL — FUROSEMIDE 20 MG TABLET: 20 | 30 days supply | Qty: 30 | Fill #0

## 2016-08-16 ENCOUNTER — Other Ambulatory Visit: Payer: Self-pay | Admitting: Internal Medicine

## 2016-08-25 MED FILL — BUPROPION HCL XL 300 MG TAB: 300 | 90 days supply | Qty: 90 | Fill #0

## 2016-08-25 MED FILL — GABAPENTIN 100 MG CAPSULE: 100 | 90 days supply | Qty: 270 | Fill #1

## 2016-09-06 ENCOUNTER — Other Ambulatory Visit: Payer: Self-pay | Admitting: Internal Medicine

## 2016-09-06 MED FILL — glipiZIDE ER 10 MG TB24: 10 | 90 days supply | Qty: 90 | Fill #1

## 2016-09-07 MED FILL — PIOGLITAZONE HCL 45 MG TAB: 45 | 90 days supply | Qty: 90 | Fill #0

## 2016-09-08 ENCOUNTER — Encounter: Payer: Self-pay | Admitting: Physician Assistant

## 2016-09-08 DIAGNOSIS — R51 Headache: Secondary | ICD-10-CM

## 2016-09-18 ENCOUNTER — Other Ambulatory Visit: Payer: 59 | Admitting: Internal Medicine

## 2016-09-19 ENCOUNTER — Other Ambulatory Visit: Payer: 59 | Admitting: Internal Medicine

## 2016-09-19 DIAGNOSIS — Z0000 Encounter for general adult medical examination without abnormal findings: Secondary | ICD-10-CM | POA: Diagnosis not present

## 2016-09-19 DIAGNOSIS — E663 Overweight: Secondary | ICD-10-CM

## 2016-09-19 DIAGNOSIS — Z1329 Encounter for screening for other suspected endocrine disorder: Secondary | ICD-10-CM

## 2016-09-19 DIAGNOSIS — I10 Essential (primary) hypertension: Secondary | ICD-10-CM | POA: Diagnosis not present

## 2016-09-19 DIAGNOSIS — N183 Chronic kidney disease, stage 3 unspecified: Secondary | ICD-10-CM

## 2016-09-19 DIAGNOSIS — E118 Type 2 diabetes mellitus with unspecified complications: Secondary | ICD-10-CM

## 2016-09-19 DIAGNOSIS — Z1321 Encounter for screening for nutritional disorder: Secondary | ICD-10-CM | POA: Diagnosis not present

## 2016-09-19 LAB — CBC WITH DIFFERENTIAL/PLATELET
Basophils Absolute: 55 {cells}/uL (ref 0–200)
Basophils Relative: 1 %
Eosinophils Absolute: 385 {cells}/uL (ref 15–500)
Eosinophils Relative: 7 %
HCT: 31.9 % — ABNORMAL LOW (ref 35.0–45.0)
Hemoglobin: 10.4 g/dL — ABNORMAL LOW (ref 11.7–15.5)
Lymphocytes Relative: 19 %
Lymphs Abs: 1045 {cells}/uL (ref 850–3900)
MCH: 31 pg (ref 27.0–33.0)
MCHC: 32.6 g/dL (ref 32.0–36.0)
MCV: 94.9 fL (ref 80.0–100.0)
MPV: 10.3 fL (ref 7.5–12.5)
Monocytes Absolute: 440 {cells}/uL (ref 200–950)
Monocytes Relative: 8 %
Neutro Abs: 3575 {cells}/uL (ref 1500–7800)
Neutrophils Relative %: 65 %
Platelets: 179 K/uL (ref 140–400)
RBC: 3.36 MIL/uL — ABNORMAL LOW (ref 3.80–5.10)
RDW: 13.9 % (ref 11.0–15.0)
WBC: 5.5 K/uL (ref 3.8–10.8)

## 2016-09-19 LAB — TSH: TSH: 0.44 m[IU]/L

## 2016-09-20 LAB — LIPID PANEL
CHOLESTEROL: 108 mg/dL (ref <200)
HDL: 41 mg/dL — AB (ref >50)
LDL CALC: 41 mg/dL (ref <100)
TRIGLYCERIDES: 131 mg/dL (ref <150)
Total CHOL/HDL Ratio: 2.6 Ratio (ref <5.0)
VLDL: 26 mg/dL (ref <30)

## 2016-09-20 LAB — HEMOGLOBIN A1C
Hgb A1c MFr Bld: 6 % — ABNORMAL HIGH (ref <5.7)
Mean Plasma Glucose: 126 mg/dL

## 2016-09-20 LAB — COMPLETE METABOLIC PANEL WITH GFR
ALT: 11 U/L (ref 6–29)
AST: 17 U/L (ref 10–35)
Albumin: 3.7 g/dL (ref 3.6–5.1)
Alkaline Phosphatase: 56 U/L (ref 33–130)
BUN: 37 mg/dL — AB (ref 7–25)
CHLORIDE: 107 mmol/L (ref 98–110)
CO2: 21 mmol/L (ref 20–31)
Calcium: 9.1 mg/dL (ref 8.6–10.4)
Creat: 2.25 mg/dL — ABNORMAL HIGH (ref 0.50–1.05)
GFR, EST NON AFRICAN AMERICAN: 24 mL/min — AB (ref >=60)
GFR, Est African American: 27 mL/min — ABNORMAL LOW (ref >=60)
GLUCOSE: 42 mg/dL — AB (ref 65–99)
POTASSIUM: 4.8 mmol/L (ref 3.5–5.3)
SODIUM: 141 mmol/L (ref 135–146)
Total Bilirubin: 0.5 mg/dL (ref 0.2–1.2)
Total Protein: 6.1 g/dL (ref 6.1–8.1)

## 2016-09-20 LAB — MICROALBUMIN / CREATININE URINE RATIO

## 2016-09-20 LAB — VITAMIN D 25 HYDROXY (VIT D DEFICIENCY, FRACTURES): Vit D, 25-Hydroxy: 51 ng/mL (ref 30–100)

## 2016-09-22 ENCOUNTER — Ambulatory Visit (INDEPENDENT_AMBULATORY_CARE_PROVIDER_SITE_OTHER): Payer: 59 | Admitting: Internal Medicine

## 2016-09-22 ENCOUNTER — Encounter: Payer: Self-pay | Admitting: Internal Medicine

## 2016-09-22 VITALS — BP 100/60 | HR 70 | Temp 98.0°F | Ht 67.5 in | Wt 218.0 lb

## 2016-09-22 DIAGNOSIS — R7989 Other specified abnormal findings of blood chemistry: Secondary | ICD-10-CM | POA: Diagnosis not present

## 2016-09-22 DIAGNOSIS — K219 Gastro-esophageal reflux disease without esophagitis: Secondary | ICD-10-CM | POA: Diagnosis not present

## 2016-09-22 DIAGNOSIS — E118 Type 2 diabetes mellitus with unspecified complications: Secondary | ICD-10-CM | POA: Diagnosis not present

## 2016-09-22 DIAGNOSIS — E781 Pure hyperglyceridemia: Secondary | ICD-10-CM | POA: Diagnosis not present

## 2016-09-22 DIAGNOSIS — Z0000 Encounter for general adult medical examination without abnormal findings: Secondary | ICD-10-CM | POA: Diagnosis not present

## 2016-09-22 DIAGNOSIS — N183 Chronic kidney disease, stage 3 unspecified: Secondary | ICD-10-CM

## 2016-09-22 DIAGNOSIS — F3170 Bipolar disorder, currently in remission, most recent episode unspecified: Secondary | ICD-10-CM

## 2016-09-22 DIAGNOSIS — Z8669 Personal history of other diseases of the nervous system and sense organs: Secondary | ICD-10-CM

## 2016-09-22 DIAGNOSIS — Z8709 Personal history of other diseases of the respiratory system: Secondary | ICD-10-CM

## 2016-09-22 DIAGNOSIS — E538 Deficiency of other specified B group vitamins: Secondary | ICD-10-CM | POA: Diagnosis not present

## 2016-09-22 DIAGNOSIS — D649 Anemia, unspecified: Secondary | ICD-10-CM | POA: Diagnosis not present

## 2016-09-22 DIAGNOSIS — I10 Essential (primary) hypertension: Secondary | ICD-10-CM | POA: Diagnosis not present

## 2016-09-22 LAB — RETICULOCYTES
ABS Retic: 37290 cells/uL (ref 20000–80000)
RBC.: 3.39 MIL/uL — AB (ref 3.80–5.10)
RETIC CT PCT: 1.1 %

## 2016-09-22 MED ORDER — SITAGLIPTIN PHOSPHATE 50 MG PO TABS: 50.0000 mg | ORAL_TABLET | Freq: Every day | ORAL | 0 refills | Status: DC

## 2016-09-22 MED FILL — JANUVIA 50 MG TABLET: 50 | 30 days supply | Qty: 30 | Fill #0

## 2016-09-22 NOTE — Progress Notes (Signed)
Subjective:    Patient ID: Samantha Hahn, female    DOB: January 08, 1961, 56 y.o.   MRN: [id number]  HPI  56 year old Female for health maintenance exam and evaluation of  medical issues. She has a history of diabetes mellitus, essential hypertension, migraine headaches, asthma, depression, insomnia, allergic rhinitis, acne rosacea, sleep apnea, bipolar disorder, PVCs and chronic kidney disease.  Followed at [facility] for chronic kidney disease.  Status post vaginal hysterectomy 2006. After that she had a consult with gastroenterologist for diarrhea, nausea, weight loss and abdominal bloating. Symptoms seemed to start after surgery. She was treated with Nexium and with a 5 day course of Flagyl with improvement.  History of allergic rhinitis with positive skin test to grasses, weeds, trees, dog and house dust mite. Testing was done by Dr. Neldon Mc in 2001. She also has had spirometry showing small airways disease. Following albuterol, FEV1 did not change significantly. Has been diagnosed with asthma.  History of GE reflux with a component of reflux-induced respiratory disease.  In 2011 she fell at home and had a brief loss of consciousness. Was diagnosed with minor head injury. CT of the spine and head were normal.  Sees GYN physician.  Had colonoscopy in 2013 and had a adenomatous colon polyp. Colonoscopy was done by Dr. Henrene Pastor.  Receives influenza vaccine through employment at [employer].  In 2013 she had adhesive capsulitis of the left shoulder and had left shoulder release by Dr. Onnie Graham.  Social history: She is divorced and has a Scientist, water quality. Does not smoke or consume alcohol. She has one adult daughter. 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  Family history: Brother with history of hypertrophic cardiomyopathy. One sister in good health. Mother with history of stroke, heart disease and kidney stones as well as  diabetes. Father with history of diabetes.       Review of Systems  Constitutional: Positive for fatigue.  Respiratory: Negative.   Cardiovascular: Negative.   Gastrointestinal: Negative.   Genitourinary: Negative.   Neurological:       History of migraine headaches       Objective:   Physical Exam  Constitutional: She is oriented to person, place, and time. She appears well-developed and well-nourished. No distress.  HENT:  Head: Normocephalic and atraumatic.  Right Ear: External ear normal.  Left Ear: External ear normal.  Mouth/Throat: Oropharynx is clear and moist.  Eyes: Pupils are equal, round, and reactive to light. Conjunctivae and EOM are normal. Right eye exhibits no discharge. Left eye exhibits no discharge.  Neck: Neck supple. No JVD present. No thyromegaly present.  Cardiovascular: Normal rate, regular rhythm, normal heart sounds and intact distal pulses.   No murmur heard. Pulmonary/Chest:  Breasts normal female  Abdominal: Soft. Bowel sounds are normal. She exhibits no distension. There is no tenderness. There is no rebound and no guarding.  Genitourinary:  Genitourinary Comments: Deferred to GYN  Musculoskeletal: She exhibits no edema.  Neurological: She is alert and oriented to person, place, and time. She has normal reflexes. No cranial nerve deficit. Coordination normal.  Skin: Skin is warm and dry. No rash noted.  Psychiatric: She has a normal mood and affect. Her behavior is normal. Judgment and thought content normal.  Vitals reviewed.         Assessment & Plan:  Controlled type 2 diabetes mellitus  Essential hypertension  GE reflux  History of bipolar disorder-stable  History of allergic rhinitis  Sleep apnea  History of adenomatous colon polyp  Chronic skin disease stage III followed by Nephrology Serum creatinine is 2.61 in 2 weeks ago was 2.25. Fax results to Nephrology  Anemia-hemoglobin 10.4 g and one year ago .was 11.4  g  MCV is normal. Anemia studies to be drawn today.  Addendum: B 12 level is low at 199. Folate level is normal. Iron level is low normal at 56. Patient will return for discussion regarding B-12 injections.

## 2016-09-23 LAB — B12 AND FOLATE PANEL
Folate: >24 ng/mL (ref >5.4)
Vitamin B-12: 199 pg/mL — ABNORMAL LOW (ref 200–1100)

## 2016-09-23 LAB — IRON AND TIBC
%SAT: 20 % (ref 11–50)
Iron: 56 ug/dL (ref 45–160)
TIBC: 276 ug/dL (ref 250–450)
UIBC: 220 ug/dL

## 2016-09-23 LAB — CREATININE, SERUM: CREATININE: 2.61 mg/dL — AB (ref 0.50–1.05)

## 2016-09-25 MED FILL — ATORVASTATIN 20 MG TABLET: 20 | 90 days supply | Qty: 90 | Fill #3

## 2016-09-28 ENCOUNTER — Ambulatory Visit (INDEPENDENT_AMBULATORY_CARE_PROVIDER_SITE_OTHER): Payer: 59 | Admitting: Internal Medicine

## 2016-09-28 ENCOUNTER — Encounter: Payer: Self-pay | Admitting: Internal Medicine

## 2016-09-28 VITALS — BP 100/68 | HR 68 | Temp 97.4°F | Wt 218.0 lb

## 2016-09-28 DIAGNOSIS — E119 Type 2 diabetes mellitus without complications: Secondary | ICD-10-CM

## 2016-09-28 DIAGNOSIS — E538 Deficiency of other specified B group vitamins: Secondary | ICD-10-CM | POA: Diagnosis not present

## 2016-09-28 MED ORDER — CYANOCOBALAMIN 1000 MCG/ML IJ SOLN
1000.0000 ug | Freq: Once | INTRAMUSCULAR | Status: AC
  Administered 2016-09-28: 1000 ug via INTRAMUSCULAR

## 2016-09-28 NOTE — Progress Notes (Signed)
   Subjective:    Patient ID: Samantha Hahn, female    DOB: May 04, 1960, 56 y.o.   MRN: [id number]  HPI  Here  For follow up of anemia. At last visit, iron level was 56 with an iron-binding capacity of 220. B-12 level was low at 199. Folate level was normal. Recheck count was 1.1%. She has been taking a B-12 supplement orally. It would seem to me she has both iron deficiency and B-12 deficiency. We're going to start her on B-12 injections and she will continue to take iron supplementation orally. After 4 weeks of B-12 injections we will repeat her CBC, B-12 level, iron and iron-binding capacity. She will also have a ferritin level at that time.   Review of Systems see above     Objective:   Physical Exam  Spent 15 minutes explaining this process to her and what she has. She acknowledges understanding of B-12 deficiency and iron deficiency. She had a colonoscopy in 2013.      Assessment & Plan:  Anemia-both iron deficiency and B-12 deficiency  Plan: B-12 1 mL IM given today. Continue oral iron and follow-up next week with another B-12 injection.

## 2016-09-28 NOTE — Patient Instructions (Signed)
B-12 1 mL IM given. Follow up next week.

## 2016-09-29 LAB — MICROALBUMIN / CREATININE URINE RATIO
CREATININE, URINE: 100 mg/dL (ref 20–320)
Microalb Creat Ratio: 24 mcg/mg creat (ref <30)
Microalb, Ur: 2.4 mg/dL

## 2016-10-05 ENCOUNTER — Ambulatory Visit: Payer: 59 | Admitting: Internal Medicine

## 2016-10-06 ENCOUNTER — Ambulatory Visit (INDEPENDENT_AMBULATORY_CARE_PROVIDER_SITE_OTHER): Payer: 59 | Admitting: Internal Medicine

## 2016-10-06 VITALS — BP 110/62 | HR 60 | Temp 96.7°F

## 2016-10-06 DIAGNOSIS — E538 Deficiency of other specified B group vitamins: Secondary | ICD-10-CM

## 2016-10-06 MED ORDER — CYANOCOBALAMIN 1000 MCG/ML IJ SOLN
1000.0000 ug | Freq: Once | INTRAMUSCULAR | Status: AC
  Administered 2016-10-06: 1000 ug via INTRAMUSCULAR

## 2016-10-06 NOTE — Patient Instructions (Signed)
B12 given today return in one week.

## 2016-10-06 NOTE — Progress Notes (Signed)
B12 33ml IM given in the Left Ventrogluteal RTC 1 week Lenise Arena, CMA

## 2016-10-08 ENCOUNTER — Encounter: Payer: Self-pay | Admitting: Internal Medicine

## 2016-10-12 ENCOUNTER — Ambulatory Visit (INDEPENDENT_AMBULATORY_CARE_PROVIDER_SITE_OTHER): Payer: 59 | Admitting: Internal Medicine

## 2016-10-12 DIAGNOSIS — E119 Type 2 diabetes mellitus without complications: Secondary | ICD-10-CM | POA: Diagnosis not present

## 2016-10-12 DIAGNOSIS — H53433 Sector or arcuate defects, bilateral: Secondary | ICD-10-CM | POA: Diagnosis not present

## 2016-10-12 DIAGNOSIS — E538 Deficiency of other specified B group vitamins: Secondary | ICD-10-CM

## 2016-10-12 LAB — HM DIABETES EYE EXAM

## 2016-10-12 MED ORDER — CYANOCOBALAMIN 1000 MCG/ML IJ SOLN
1000.0000 ug | Freq: Once | INTRAMUSCULAR | Status: AC
  Administered 2016-10-12: 1000 ug via INTRAMUSCULAR

## 2016-10-12 MED FILL — ESCITALOPRAM 10 MG TABLET: 10 | 90 days supply | Qty: 90 | Fill #1

## 2016-10-12 MED FILL — metFORMIN HCL 1000 MG TABS: 1000 | 90 days supply | Qty: 180 | Fill #3

## 2016-10-12 MED FILL — DOXYCYCLINE HYCLATE 100 MG: 100 | 90 days supply | Qty: 90 | Fill #1

## 2016-10-12 NOTE — Progress Notes (Signed)
B12 injection given This is 3rd dose /3rd week. Due for one next week then monthly

## 2016-10-12 NOTE — Patient Instructions (Signed)
B12 10ml IM given in the rIGHT Ventrogluteal RTC 1 week, [place], CMA

## 2016-10-18 ENCOUNTER — Other Ambulatory Visit: Payer: Self-pay | Admitting: Internal Medicine

## 2016-10-18 MED FILL — MONTELUKAST SOD 10 MG TAB: 10 | 90 days supply | Qty: 90 | Fill #1

## 2016-10-18 MED FILL — JANUVIA 50 MG TABLET: 50 | 90 days supply | Qty: 90 | Fill #0

## 2016-10-19 ENCOUNTER — Ambulatory Visit (INDEPENDENT_AMBULATORY_CARE_PROVIDER_SITE_OTHER): Payer: 59 | Admitting: Internal Medicine

## 2016-10-19 ENCOUNTER — Encounter: Payer: Self-pay | Admitting: Internal Medicine

## 2016-10-19 VITALS — BP 108/60 | HR 80 | Temp 98.5°F

## 2016-10-19 DIAGNOSIS — E538 Deficiency of other specified B group vitamins: Secondary | ICD-10-CM

## 2016-10-19 LAB — CBC WITH DIFFERENTIAL/PLATELET
BASOS ABS: 0 {cells}/uL (ref 0–200)
Basophils Relative: 0 %
EOS PCT: 6 %
Eosinophils Absolute: 414 cells/uL (ref 15–500)
HEMATOCRIT: 33.6 % — AB (ref 35.0–45.0)
Hemoglobin: 10.6 g/dL — ABNORMAL LOW (ref 11.7–15.5)
LYMPHS PCT: 17 %
Lymphs Abs: 1173 cells/uL (ref 850–3900)
MCH: 30.4 pg (ref 27.0–33.0)
MCHC: 31.5 g/dL — AB (ref 32.0–36.0)
MCV: 96.3 fL (ref 80.0–100.0)
MPV: 10.8 fL (ref 7.5–12.5)
Monocytes Absolute: 690 cells/uL (ref 200–950)
Monocytes Relative: 10 %
NEUTROS PCT: 67 %
Neutro Abs: 4623 cells/uL (ref 1500–7800)
Platelets: 193 10*3/uL (ref 140–400)
RBC: 3.49 MIL/uL — AB (ref 3.80–5.10)
RDW: 13.8 % (ref 11.0–15.0)
WBC: 6.9 10*3/uL (ref 3.8–10.8)

## 2016-10-19 MED ORDER — CYANOCOBALAMIN 1000 MCG/ML IJ SOLN
1000.0000 ug | Freq: Once | INTRAMUSCULAR | Status: AC
  Administered 2016-10-19: 1000 ug via INTRAMUSCULAR

## 2016-10-19 NOTE — Patient Instructions (Addendum)
B12 given today, return in one week. Labs drawn.

## 2016-10-19 NOTE — Progress Notes (Signed)
B12 52ml IM given in the Left ventrogluteal RTC 1 week Lenise Arena, CMA

## 2016-10-20 LAB — VITAMIN B12: VITAMIN B 12: 1051 pg/mL (ref 200–1100)

## 2016-10-26 ENCOUNTER — Encounter: Payer: Self-pay | Admitting: Nurse Practitioner

## 2016-10-26 ENCOUNTER — Encounter: Payer: Self-pay | Admitting: Internal Medicine

## 2016-10-26 ENCOUNTER — Ambulatory Visit (INDEPENDENT_AMBULATORY_CARE_PROVIDER_SITE_OTHER): Payer: 59 | Admitting: Internal Medicine

## 2016-10-26 VITALS — BP 102/60 | HR 71 | Temp 98.7°F | Wt 218.0 lb

## 2016-10-26 DIAGNOSIS — D649 Anemia, unspecified: Secondary | ICD-10-CM | POA: Diagnosis not present

## 2016-10-26 DIAGNOSIS — E538 Deficiency of other specified B group vitamins: Secondary | ICD-10-CM | POA: Diagnosis not present

## 2016-10-26 DIAGNOSIS — Z1211 Encounter for screening for malignant neoplasm of colon: Secondary | ICD-10-CM

## 2016-10-26 MED ORDER — CYANOCOBALAMIN 1000 MCG/ML IJ SOLN
1000.0000 ug | Freq: Once | INTRAMUSCULAR | Status: AC
  Administered 2016-10-26: 1000 ug via INTRAMUSCULAR

## 2016-10-26 MED ORDER — CYANOCOBALAMIN 1000 MCG/ML IJ SOLN: 1000.0000 ug | INTRAMUSCULAR | 0 refills | Status: DC

## 2016-10-26 MED FILL — CYANOCOBALAMIN 1,000 MCG/ML: 1000 | 28 days supply | Qty: 1 | Fill #0

## 2016-10-26 NOTE — Patient Instructions (Signed)
Continue monthly B-12 injections, have colonoscopy, return in 4 weeks

## 2016-10-26 NOTE — Progress Notes (Signed)
   Subjective:    Patient ID: Samantha Hahn, female    DOB: 01-Jan-1961, 56 y.o.   MRN: [id number]  HPI  B12  Level recently checked after starting B-12 injections had improved from 199 in early August to 1051. She had been getting weekly injections. Now will be getting monthly injections. Her daughter is willing to do this at home.  In early August when she was found to be B-12 deficient her iron level was 56. She is taking an iron supplement once a day. In early August recheck count was 1.1%.  Recent CBC drawn on August 30 showed hemoglobin to be 10.6 g and previously a month ago had been 10.4 g. MCV is normal. White blood cell count and platelet count normal. Tends to run a hemoglobin in the 11 range. I had hoped this would improve would B-12 injections. We are going to check this again in about a month and see if hemoglobin has improved. She'll continue iron supplement and monthly B-12 injections. She'll have recheck count when she returns.  Is due for colonoscopy in the near future. Referral will be made.    Review of Systems no new complaints     Objective:   Physical Exam  Not examined as bit 15 minutes speaking with her about these issues and my CMA worked with her daughter regarding giving IM injections for B-12      Assessment & Plan:  B-12 deficiency  Anemia  Mild iron deficiency  Health maintenance to receive flu vaccine through employment  Plan: Return in 4 weeks for follow-up. Referral made for colonoscopy.

## 2016-11-10 ENCOUNTER — Other Ambulatory Visit: Payer: Self-pay | Admitting: Internal Medicine

## 2016-11-15 ENCOUNTER — Ambulatory Visit: Payer: Self-pay | Admitting: Nurse Practitioner

## 2016-11-20 ENCOUNTER — Other Ambulatory Visit: Payer: 59 | Admitting: Internal Medicine

## 2016-11-21 ENCOUNTER — Other Ambulatory Visit: Payer: 59 | Admitting: Internal Medicine

## 2016-11-23 ENCOUNTER — Ambulatory Visit: Payer: 59 | Admitting: Internal Medicine

## 2016-11-27 ENCOUNTER — Other Ambulatory Visit: Payer: Self-pay | Admitting: Internal Medicine

## 2016-11-27 ENCOUNTER — Encounter: Payer: Self-pay | Admitting: Internal Medicine

## 2016-11-27 MED FILL — METOPROLOL SUCC ER 50 MG TA: 50 | 90 days supply | Qty: 90 | Fill #0

## 2016-11-27 MED FILL — BUPROPION HCL XL 300 MG TAB: 300 | 90 days supply | Qty: 90 | Fill #0

## 2016-11-27 MED FILL — ACCU-CHEK GUIDE TEST STRIP: 50 days supply | Qty: 100 | Fill #1

## 2016-12-01 ENCOUNTER — Encounter: Payer: Self-pay | Admitting: Physician Assistant

## 2016-12-12 ENCOUNTER — Other Ambulatory Visit: Payer: 59 | Admitting: Internal Medicine

## 2016-12-14 ENCOUNTER — Ambulatory Visit: Payer: 59 | Admitting: Internal Medicine

## 2016-12-25 ENCOUNTER — Other Ambulatory Visit: Payer: Self-pay | Admitting: Internal Medicine

## 2016-12-25 DIAGNOSIS — E538 Deficiency of other specified B group vitamins: Secondary | ICD-10-CM

## 2016-12-25 MED FILL — glipiZIDE ER 10 MG TB24: 10 | 90 days supply | Qty: 90 | Fill #2

## 2016-12-25 MED FILL — PIOGLITAZONE HCL 45 MG TAB: 45 | 90 days supply | Qty: 90 | Fill #1

## 2016-12-25 NOTE — Telephone Encounter (Signed)
She does her injections at home, so it is the vials for her shots.

## 2016-12-25 NOTE — Telephone Encounter (Signed)
Not sure what they mean. Is this a bottle or individual doses?

## 2016-12-26 ENCOUNTER — Other Ambulatory Visit: Payer: 59 | Admitting: Internal Medicine

## 2016-12-26 MED FILL — CYANOCOBALAMIN 1,000 MCG/ML: 1000 | 28 days supply | Qty: 1 | Fill #0

## 2016-12-28 ENCOUNTER — Telehealth: Payer: Self-pay | Admitting: Internal Medicine

## 2016-12-28 ENCOUNTER — Ambulatory Visit: Payer: 59 | Admitting: Internal Medicine

## 2016-12-28 NOTE — Telephone Encounter (Signed)
Left message to follow-up on missed appointment for B12 deficiency.  She has not had labs prior to appointment as previously ordered either.  Left message to this effect as well.  Needs to call and reschedule both lab appointment and Dr. appointment.

## 2017-01-01 ENCOUNTER — Other Ambulatory Visit: Payer: 59 | Admitting: Internal Medicine

## 2017-01-02 ENCOUNTER — Ambulatory Visit: Payer: 59 | Admitting: Internal Medicine

## 2017-01-02 ENCOUNTER — Other Ambulatory Visit: Payer: 59 | Admitting: Internal Medicine

## 2017-01-05 ENCOUNTER — Encounter: Payer: Self-pay | Admitting: Physician Assistant

## 2017-01-08 ENCOUNTER — Other Ambulatory Visit: Payer: 59 | Admitting: Internal Medicine

## 2017-01-08 DIAGNOSIS — D649 Anemia, unspecified: Secondary | ICD-10-CM

## 2017-01-08 DIAGNOSIS — E538 Deficiency of other specified B group vitamins: Secondary | ICD-10-CM | POA: Diagnosis not present

## 2017-01-09 LAB — B12 AND FOLATE PANEL
FOLATE: 21.9 ng/mL
VITAMIN B 12: 1012 pg/mL (ref 200–1100)

## 2017-01-09 LAB — IRON,TIBC AND FERRITIN PANEL
%SAT: 20 % (calc) (ref 11–50)
Ferritin: 35 ng/mL (ref 10–232)
IRON: 53 ug/dL (ref 45–160)
TIBC: 267 ug/dL (ref 250–450)

## 2017-01-09 LAB — RETICULOCYTES
ABS RETIC: 36600 {cells}/uL (ref 20000–8000)
Retic Ct Pct: 1.2 %

## 2017-01-09 LAB — CBC WITH DIFFERENTIAL/PLATELET
BASOS ABS: 38 {cells}/uL (ref 0–200)
Basophils Relative: 0.7 %
EOS PCT: 14.4 %
Eosinophils Absolute: 778 cells/uL — ABNORMAL HIGH (ref 15–500)
HCT: 29.1 % — ABNORMAL LOW (ref 35.0–45.0)
Hemoglobin: 9.3 g/dL — ABNORMAL LOW (ref 11.7–15.5)
LYMPHS ABS: 1156 {cells}/uL (ref 850–3900)
MCH: 30.2 pg (ref 27.0–33.0)
MCHC: 32 g/dL (ref 32.0–36.0)
MCV: 94.5 fL (ref 80.0–100.0)
MONOS PCT: 8.3 %
MPV: 11.4 fL (ref 7.5–12.5)
NEUTROS ABS: 2981 {cells}/uL (ref 1500–7800)
NEUTROS PCT: 55.2 %
PLATELETS: 148 10*3/uL (ref 140–400)
RBC: 3.08 10*6/uL — ABNORMAL LOW (ref 3.80–5.10)
RDW: 12.1 % (ref 11.0–15.0)
TOTAL LYMPHOCYTE: 21.4 %
WBC mixed population: 448 cells/uL (ref 200–950)
WBC: 5.4 10*3/uL (ref 3.8–10.8)

## 2017-01-10 ENCOUNTER — Telehealth: Payer: Self-pay | Admitting: Internal Medicine

## 2017-01-10 ENCOUNTER — Ambulatory Visit: Payer: 59 | Admitting: Internal Medicine

## 2017-01-10 NOTE — Telephone Encounter (Signed)
Patient did not show for her 9:45 appointment today (01/10/17).  She had labs done on 11/20 for the appointment, but didn't show up this morning.  I attempted to reach the patient by phone shortly after 10:00 a.m.  I left a voicemail asking the patient to contact the office regarding her appointment that she missed this morning.  Left her the phone number.  This is the 5th appointment she has made for this particular visit following up on her anemia.  We have yet to see the patient in follow up for her anemia.

## 2017-01-19 ENCOUNTER — Other Ambulatory Visit: Payer: Self-pay | Admitting: Internal Medicine

## 2017-01-19 ENCOUNTER — Ambulatory Visit (INDEPENDENT_AMBULATORY_CARE_PROVIDER_SITE_OTHER): Payer: 59 | Admitting: Internal Medicine

## 2017-01-19 ENCOUNTER — Encounter: Payer: Self-pay | Admitting: Internal Medicine

## 2017-01-19 VITALS — BP 102/70 | HR 70 | Ht 67.0 in | Wt 214.0 lb

## 2017-01-19 DIAGNOSIS — D649 Anemia, unspecified: Secondary | ICD-10-CM

## 2017-01-19 DIAGNOSIS — E162 Hypoglycemia, unspecified: Secondary | ICD-10-CM

## 2017-01-19 DIAGNOSIS — E538 Deficiency of other specified B group vitamins: Secondary | ICD-10-CM

## 2017-01-19 DIAGNOSIS — Z8669 Personal history of other diseases of the nervous system and sense organs: Secondary | ICD-10-CM | POA: Diagnosis not present

## 2017-01-19 DIAGNOSIS — E119 Type 2 diabetes mellitus without complications: Secondary | ICD-10-CM

## 2017-01-19 DIAGNOSIS — N183 Chronic kidney disease, stage 3 unspecified: Secondary | ICD-10-CM

## 2017-01-19 DIAGNOSIS — Z8659 Personal history of other mental and behavioral disorders: Secondary | ICD-10-CM | POA: Diagnosis not present

## 2017-01-19 NOTE — Progress Notes (Signed)
   Subjective:    Patient ID: Samantha Hahn, female    DOB: 12-Aug-1960, 56 y.o.   MRN: [id number]  HPI 56 year old Female with history of diabetes mellitus in today with history of anemia.  She has been on Januvia and Actos.  Recently her serum glucose has been low in the 40s at times.  She was already on a reduced dose of Januvia 50 mg daily.  Were going to stop that and see how Accu-Cheks to but she will continue with Actos.  We have been evaluating her for anemia now for a number of weeks.  She had some mild B12 deficiency and is receiving B12 injections from her daughter at home.  B12 level is normal.  However she is persistently anemic and that is of concern to me.  She has an appointment with Dr. Henrene Pastor next week for GI evaluation of anemia.  No history of melena.  He is taking iron supplement so stools are dark.  No bright red blood per rectum.  Her hemoglobin A1c is drawn today and is pending.  3 months ago her hemoglobin was 10.6 g.  Now it is 9.3 g.  MCV is 94.5.  She is on iron supplement over-the-counter.  Eosinophils are elevated at 778.  Folate and B12 levels are normal.  Iron level is 53 and was 56 3 months ago so it is not made much progress.  I am wondering if she is not able to absorb iron or if she has chronic iron loss.  Her ferritin is 35.  Recheck count is not changed in the past 3 months.  Review of Systems situational stress with mother who has dementia     Objective:   Physical Exam  Not examined today but I spent 30 minutes talking with her about all of these issues.      Assessment & Plan:  Persistent anemia despite taking iron supplement and B12 injections-her B12 level was 199 in early August.  Her iron level was 56 in August.  I am glad she is seeing Dr. Henrene Pastor next week.  May need endoscopy and colonoscopy.  Defer to Dr. Blanch Media judgment.  Diabetes mellitus she will discontinue Januvia and continue with Actos.  Hemoglobin A1c pending.  History of  depression  Situational stress  Further recommendations to follow once hemoglobin A1c is checked and she sees gastroenterologist.  Stop Januvia due to episodes of hypoglycemia.  Continue Actos.  Hyperlipidemia continue statin medication.

## 2017-01-19 NOTE — Telephone Encounter (Signed)
Discontinue Januvia and refill these x 6 months.

## 2017-01-19 NOTE — Patient Instructions (Signed)
Continue monthly B12 injections.  Has GI appointment next week.  Hemoglobin A1c checked today.

## 2017-01-20 LAB — HEMOGLOBIN A1C
EAG (MMOL/L): 7.1 (calc)
Hgb A1c MFr Bld: 6.1 % of total Hgb — ABNORMAL HIGH (ref <5.7)
Mean Plasma Glucose: 128 (calc)

## 2017-01-22 ENCOUNTER — Ambulatory Visit: Payer: Self-pay | Admitting: Internal Medicine

## 2017-01-25 ENCOUNTER — Encounter: Payer: Self-pay | Admitting: Internal Medicine

## 2017-01-25 MED FILL — metFORMIN HCL 1000 MG TABS: 1000 | 90 days supply | Qty: 180 | Fill #0

## 2017-01-25 MED FILL — CYANOCOBALAMIN 1,000 MCG/ML: 1000 | 28 days supply | Qty: 1 | Fill #0

## 2017-01-25 MED FILL — ATORVASTATIN 20 MG TABLET: 20 | 90 days supply | Qty: 90 | Fill #0

## 2017-01-25 MED FILL — SYMBICORT 160-4.5 MCG INH: 160-4.5 | 30 days supply | Qty: 10 | Fill #0

## 2017-01-25 MED FILL — MONTELUKAST SOD 10 MG TAB: 10 | 90 days supply | Qty: 90 | Fill #0

## 2017-01-26 ENCOUNTER — Encounter: Payer: Self-pay | Admitting: Physician Assistant

## 2017-01-26 ENCOUNTER — Ambulatory Visit: Payer: 59 | Admitting: Physician Assistant

## 2017-01-26 DIAGNOSIS — F329 Major depressive disorder, single episode, unspecified: Secondary | ICD-10-CM

## 2017-01-26 DIAGNOSIS — G43109 Migraine with aura, not intractable, without status migrainosus: Secondary | ICD-10-CM | POA: Diagnosis not present

## 2017-01-26 DIAGNOSIS — F32A Depression, unspecified: Secondary | ICD-10-CM

## 2017-01-26 MED ORDER — FREMANEZUMAB-VFRM 225 MG/1.5ML ~~LOC~~ SOSY: 225.0000 mg | PREFILLED_SYRINGE | SUBCUTANEOUS | 5 refills | Status: DC

## 2017-01-26 MED ORDER — HYDROCODONE-ACETAMINOPHEN 5-325 MG PO TABS: 1.0000 | ORAL_TABLET | Freq: Four times a day (QID) | ORAL | 0 refills | Status: DC | PRN

## 2017-01-26 MED ORDER — GABAPENTIN 100 MG PO CAPS: 100.0000 mg | ORAL_CAPSULE | Freq: Three times a day (TID) | ORAL | 2 refills | Status: DC

## 2017-01-26 MED ORDER — PROMETHAZINE HCL 25 MG PO TABS: 25.0000 mg | ORAL_TABLET | Freq: Three times a day (TID) | ORAL | 3 refills | Status: DC | PRN

## 2017-01-26 MED FILL — AJOVY 225 MG/1.5ML SOSY: 225 | 30 days supply | Qty: 2 | Fill #0

## 2017-01-26 MED FILL — HYDROCODON-APAP 5-325: 5-325 | 8 days supply | Qty: 30 | Fill #0

## 2017-01-26 MED FILL — GABAPENTIN 100 MG CAP: 100 | 90 days supply | Qty: 270 | Fill #0

## 2017-01-26 MED FILL — PROMETHAZINE 25 MG TABLET: 25 | 10 days supply | Qty: 30 | Fill #0

## 2017-01-26 NOTE — Patient Instructions (Signed)

## 2017-01-26 NOTE — Progress Notes (Signed)
Pt presents visit today for her  Headaches.   Pt states headaches did get better after 01/11/2016 visit. but  have returned and she has been having a headache every week they have got worse this past week    History:  Samantha Hahn is a 56 y.o. No obstetric history on file. who presents to clinic today for migraine followup. They have been worse, likely stress related.  She plans for early retirement next month.  She has been taking the norco more often.  No change in quality of migraine.      Past Medical History:  Diagnosis Date  . Abnormal vaginal Pap smear   . Acne rosacea   . Allergy   . Anemia   . Anxiety   . Asthma with COPD with status asthmaticus ([place])   . Depression    resolved  . Diabetes mellitus   . Dysrhythmia    pvc  . GERD (gastroesophageal reflux disease)   . Hot flashes   . Hypertension     resolved,     dr hochrein  . Migraines   . Sleep apnea    workup still in process    Social History   Socioeconomic History  . Marital status: Divorced    Spouse name: Not on file  . Number of children: Not on file  . Years of education: Not on file  . Highest education level: Not on file  Social Needs  . Financial resource strain: Not on file  . Food insecurity - worry: Not on file  . Food insecurity - inability: Not on file  . Transportation needs - medical: Not on file  . Transportation needs - non-medical: Not on file  Occupational History  . Occupation: PT    Employer: [employer]  Tobacco Use  . Smoking status: Never Smoker  . Smokeless tobacco: Never Used  Substance and Sexual Activity  . Alcohol use: Yes    Comment: occ  . Drug use: No  . Sexual activity: Not Currently    Birth control/protection: Surgical  Other Topics Concern  . Not on file  Social History Narrative  . Not on file    Family History  Problem Relation Age of Onset  . Heart disease Mother 70       CAD  . Asthma Mother   . Dementia Mother   . Stroke Mother   . Breast  cancer Mother   . Asthma Sister   . Heart Problems Father   . Breast cancer Maternal Grandmother   . Colon cancer Neg Hx   . Esophageal cancer Neg Hx   . Rectal cancer Neg Hx   . Stomach cancer Neg Hx     Allergies  Allergen Reactions  . Clarithromycin Hives  . Vicodin [Hydrocodone-Acetaminophen] Nausea And Vomiting  . Exenatide Other (See Comments)    Rash & itching at injection site    Current Outpatient Medications on File Prior to Visit  Medication Sig Dispense Refill  . ACCU-CHEK FASTCLIX LANCETS MISC USE TO TEST TWICE A DAY AS NEEDED 200 each 12  . albuterol (PROVENTIL HFA;VENTOLIN HFA) 108 (90 BASE) MCG/ACT inhaler Inhale 2 puffs into the lungs every 6 (six) hours as needed. For shortness of breath    . aspirin 325 MG tablet Take 325 mg by mouth daily.    Marland Kitchen atorvastatin (LIPITOR) 20 MG tablet TAKE 1 TABLET BY MOUTH DAILY. 90 tablet 1  . buPROPion (WELLBUTRIN XL) 300 MG 24 hr tablet TAKE 1  TABLET BY MOUTH DAILY. 90 tablet 1  . Calcium-Vitamin D 600-200 MG-UNIT per tablet Take 1 tablet by mouth daily.    . cholecalciferol (VITAMIN D) 1000 units tablet Take 1,000 Units by mouth daily.    . cyanocobalamin (,VITAMIN B-12,) 1000 MCG/ML injection INJECT 1 ML (1,000 MCG TOTAL) INTO THE MUSCLE EVERY 30 DAYS 1 mL 5  . doxycycline (VIBRA-TABS) 100 MG tablet Take 1 tablet (100 mg total) by mouth daily. 90 tablet PRN  . escitalopram (LEXAPRO) 10 MG tablet Take 1 tablet (10 mg total) by mouth daily. 90 tablet 3  . eszopiclone (LUNESTA) 1 MG TABS tablet Take 1 tablet (1 mg total) by mouth at bedtime as needed for sleep. Take immediately before bedtime 30 tablet 1  . ferrous fumarate (HEMOCYTE - 106 MG FE) 325 (106 FE) MG TABS Take 1 tablet by mouth daily.    . furosemide (LASIX) 20 MG tablet Take 20 mg by mouth.    . gabapentin (NEURONTIN) 100 MG capsule Take 1 capsule (100 mg total) by mouth 3 (three) times daily. 270 capsule 2  . GLIPIZIDE XL 10 MG 24 hr tablet TAKE 1 TABLET BY MOUTH ONCE  DAILY 90 tablet 3  . glucose blood (ACCU-CHEK GUIDE) test strip Use TO TEST TWICE A DAY AS NEEDED 100 each 12  . HYDROcodone-acetaminophen (NORCO/VICODIN) 5-325 MG tablet Take 1 tablet by mouth every 6 (six) hours as needed for moderate pain. Reported on 04/20/2015 30 tablet 0  . loratadine (CLARITIN) 10 MG tablet Take 10 mg by mouth daily after breakfast. For allergies    . metFORMIN (GLUCOPHAGE) 1000 MG tablet TAKE 1 TABLET BY MOUTH TWICE DAILY 180 tablet 1  . metoprolol succinate (TOPROL-XL) 50 MG 24 hr tablet TAKE 1 TABLET BY MOUTH ONCE DAILY 90 tablet 3  . metroNIDAZOLE (METROCREAM) 0.75 % cream Apply topically 2 (two) times daily. To face 45 g prn  . montelukast (SINGULAIR) 10 MG tablet TAKE 1 TABLET BY MOUTH EVERY MORNING. 90 tablet 1  . multivitamin (THERAGRAN) per tablet Take 1 tablet by mouth daily.      . pioglitazone (ACTOS) 45 MG tablet TAKE 1 TABLET BY MOUTH ONCE DAILY 90 tablet 1  . promethazine (PHENERGAN) 25 MG tablet Take 1 tablet (25 mg total) by mouth every 8 (eight) hours as needed for nausea or vomiting. 30 tablet 3  . QNASL 80 MCG/ACT AERS PLACE 1 SPRAYS IN EACH NOSTRIL ONCE DAILY FOR STUFFY NOSE OR DRAINAGE 8.7 g 11  . SYMBICORT 160-4.5 MCG/ACT inhaler INHALE 2 PUFFS INTO THE LUNGS 2 TIMES DAILY. 10.2 g 5   No current facility-administered medications on file prior to visit.      Review of Systems:  All pertinent positive/negative included in HPI, all other review of systems are negative   Objective:  Physical Exam BP 121/76   Pulse 64   Wt 210 lb 9.6 oz (95.5 kg)   BMI 32.98 kg/m  CONSTITUTIONAL: Well-developed, well-nourished female in no acute distress.  EYES: EOM intact ENT: Normocephalic CARDIOVASCULAR: Regular rate RESPIRATORY: Normal rate.  MUSCULOSKELETAL: Normal ROM SKIN: Warm, dry without erythema  NEUROLOGICAL: Alert, oriented, CN II-XII grossly intact, Appropriate balance PSYCH: Normal behavior, mood   Assessment & Plan:  Assessment: 1.  Migraine with aura and without status migrainosus, not intractable   2. Depression, unspecified depression type      Plan: See PCP regarding depression Continue Neurontin for migraine prevention Begin Ajovy for migraine prevention as neurontin is incompletely effective.  Norco PRN - limit use.  Will not refill prior to return visit.   Plan is for ajovy to work so well that we are able to decrease and discontinue neurontin AND norco.   Follow-up in 3 months or sooner PRN  Paticia Stack, PA-C 01/26/2017 10:07 AM

## 2017-02-05 MED FILL — ESCITALOPRAM 10 MG TABLET: 10 | 90 days supply | Qty: 90 | Fill #2

## 2017-02-05 MED FILL — DOXYCYCLINE HYCLATE 100 MG: 100 | 90 days supply | Qty: 90 | Fill #2

## 2017-03-01 ENCOUNTER — Telehealth: Payer: Self-pay | Admitting: Radiology

## 2017-03-01 MED FILL — BUPROPION HCL XL 300 MG TAB: 300 | 90 days supply | Qty: 90 | Fill #0

## 2017-03-01 MED FILL — METOPROLOL SUCC ER 50 MG TA: 50 | 90 days supply | Qty: 90 | Fill #1

## 2017-03-01 MED FILL — CYANOCOBALAMIN 1,000 MCG/ML: 1000 | 28 days supply | Qty: 1 | Fill #1

## 2017-03-01 NOTE — Telephone Encounter (Signed)
Left message on the cell phone voicemail to call [facility] to scheduled a f/u headache appointment with Allie Dimmer in March 2019, provided phone number for call back.

## 2017-03-15 ENCOUNTER — Encounter: Payer: Self-pay | Admitting: Internal Medicine

## 2017-03-15 ENCOUNTER — Ambulatory Visit (INDEPENDENT_AMBULATORY_CARE_PROVIDER_SITE_OTHER): Payer: No Typology Code available for payment source | Admitting: Internal Medicine

## 2017-03-15 ENCOUNTER — Other Ambulatory Visit (INDEPENDENT_AMBULATORY_CARE_PROVIDER_SITE_OTHER): Payer: No Typology Code available for payment source

## 2017-03-15 VITALS — BP 122/60 | HR 72 | Ht 67.0 in | Wt 183.0 lb

## 2017-03-15 DIAGNOSIS — R197 Diarrhea, unspecified: Secondary | ICD-10-CM

## 2017-03-15 DIAGNOSIS — D649 Anemia, unspecified: Secondary | ICD-10-CM | POA: Diagnosis not present

## 2017-03-15 DIAGNOSIS — K219 Gastro-esophageal reflux disease without esophagitis: Secondary | ICD-10-CM | POA: Diagnosis not present

## 2017-03-15 DIAGNOSIS — Z86010 Personal history of colonic polyps: Secondary | ICD-10-CM

## 2017-03-15 DIAGNOSIS — K222 Esophageal obstruction: Secondary | ICD-10-CM

## 2017-03-15 LAB — IGA: IgA: 234 mg/dL (ref 68–378)

## 2017-03-15 MED ORDER — NA SULFATE-K SULFATE-MG SULF 17.5-3.13-1.6 GM/177ML PO SOLN: 1.0000 | Freq: Once | ORAL | 0 refills | Status: AC

## 2017-03-15 MED FILL — SUPREP BOWEL PREP KIT: 17.5-3.13-1 | 1 days supply | Qty: 354 | Fill #0

## 2017-03-15 NOTE — Patient Instructions (Signed)
Your physician has requested that you go to the basement for the following lab work before leaving today:  TTG, IGA  You have been scheduled for an endoscopy and colonoscopy. Please follow the written instructions given to you at your visit today. Please pick up your prep supplies at the pharmacy within the next 1-3 days. If you use inhalers (even only as needed), please bring them with you on the day of your procedure. Your physician has requested that you go to www.startemmi.com and enter the access code given to you at your visit today. This web site gives a general overview about your procedure. However, you should still follow specific instructions given to you by our office regarding your preparation for the procedure.

## 2017-03-16 ENCOUNTER — Encounter: Payer: Self-pay | Admitting: Internal Medicine

## 2017-03-16 LAB — TISSUE TRANSGLUTAMINASE, IGA: (TTG) AB, IGA: 1 U/mL

## 2017-03-16 NOTE — Progress Notes (Signed)
HISTORY OF PRESENT ILLNESS:  Samantha Hahn is a 57 y.o. female recently retired physical therapist for [employer], who is referred today by her primary care provider Dr. Renold Genta with chief complaint of anemia and diarrhea. Patient has not been seen in this office since September 2013 when she underwent screening colonoscopy. She was found to have 2 diminutive colon polyps one of which was a tubular adenoma. No other abnormalities. Follow-up in 5 years recommended. Patient also has a history of GERD. In 2006 she underwent upper endoscopy for complaints of dysphagia. She was found to have a distal esophageal stricture which was dilated with a 54 [place] Maloney dilator. Her swallowing problems resolved. She is on no regular medication for acid reflux that has occasional reflux symptoms for which she takes antacids in the form of Tums. Again, no recurrent dysphagia. Terms of anemia, outside blood work has been reviewed. Hemoglobin in August 2018 was 10.6. MCV 96.3. Normal white blood cell count and platelets. She was found to be B12 deficient and placed on B12 replacement therapy. Follow-up hemoglobin in November on adequate B12 replacement was 9.3. Iron studies were obtained and did not support iron deficiency, though were low normal. Patient tells me that she does have a previous history of anemia related to excessive uterine bleeding for which she underwent hysterectomy. She tells me that she has been on iron replacement for years. She also has a new complaint of diarrhea over the past 8 months. She will experiences 3-4 times per week and occasionally at night. Not necessarily related to meals. She reports 45 pound weight loss over the past 6-7 months which she attributes to change in diet and decreased appetite but no exercise. She denies bleeding or abdominal pain. She does use equal sugar substitute as well as drink significant amounts of diet soda. She is also on metformin for her diabetes in addition  to Actos and glipizide. No recent antibiotic exposure. Last hemoglobin A1c 6.1.  Most recent mammogram x-ray from last negative for malignancy  REVIEW OF SYSTEMS:  All non-GI ROS negative unless otherwise stated in the history of present illness except for sinus and allergy trouble, depression, fatigue, headaches, night sweats, ankle edema  Past Medical History:  Diagnosis Date  . Abnormal vaginal Pap smear   . Acne rosacea   . Allergy   . Anemia   . Anxiety   . Asthma with COPD with status asthmaticus ([place])   . Depression    resolved  . Diabetes mellitus   . Dysrhythmia    pvc  . GERD (gastroesophageal reflux disease)   . Hot flashes   . Hypertension     resolved,     dr hochrein  . Migraines   . Sleep apnea    workup still in process    Past Surgical History:  Procedure Laterality Date  . ABDOMINAL HYSTERECTOMY    . SHOULDER ARTHROSCOPY  06/08/2011   Procedure: ARTHROSCOPY SHOULDER;  Surgeon: Marin Shutter, MD;  Location: [facility];  Service: Orthopedics;  Laterality: Left;  MUA, LOA, SAD, DCR    Social History Samantha Hahn  reports that  has never smoked. she has never used smokeless tobacco. She reports that she drinks alcohol. She reports that she does not use drugs.  family history includes Asthma in her mother and sister; Breast cancer in her maternal grandmother and mother; Dementia in her mother; Heart Problems in her father; Heart disease (age of onset: 9) in her mother; Stroke  in her mother.  Allergies  Allergen Reactions  . Clarithromycin Hives  . Vicodin [Hydrocodone-Acetaminophen] Nausea And Vomiting  . Exenatide Other (See Comments)    Rash & itching at injection site       PHYSICAL EXAMINATION: Vital signs: BP 122/60   Pulse 72   Ht 5\' 7"  (1.702 m)   Wt 183 lb (83 kg)   BMI 28.66 kg/m   Constitutional: generally well-appearing, no acute distress Psychiatric: alert and oriented x3, cooperative Eyes: extraocular movements intact, anicteric,  conjunctiva slightly pale Mouth: oral pharynx moist, no lesions Neck: supple without the melena Lymph: no lymphadenopathy Cardiovascular: heart regular rate and rhythm, no murmur Lungs: clear to auscultation bilaterally Abdomen: soft, nontender, nondistended, no obvious ascites, no peritoneal signs, normal bowel sounds, no organomegaly Rectal: Deferred until colonoscopy Extremities: no clubbing, cyanosis, or lower extremity edema bilaterally Skin: no lesions on visible extremities Neuro: No focal deficits. Cranial nerves intact. No asterixis.    ASSESSMENT:  #1. Normocytic anemia. History of B12 deficiency on replacement. On chronic iron therapy for history of "anemia" in the past. Iron studies low normal #2. The above history of diarrhea. May be related to sugar substitutes and diet drinks causing an osmotic effect. May be due to medications, namely metformin. Rule out organic causes such as microscopic colitis bile salt diarrhea, or celiac sprue in a diabetic with diarrhea and weight loss and possible borderline iron deficiency #3. History of GERD with esophageal stricture. Currently using on demand antacids for reflux symptoms. No recurrent dysphagia #4. History of adenomatous colon polyps. September 2013. Due for surveillance #5. Multiple medical problems including diabetes mellitus   PLAN:  #1. Colonoscopy for surveillance and to evaluate chronic diarrhea as well as anemia. Biopsies to rule out microscopic colitis.The nature of the procedure, as well as the risks, benefits, and alternatives were carefully and thoroughly reviewed with the patient. Ample time for discussion and questions allowed. The patient understood, was satisfied, and agreed to proceed. #2. Upper endoscopy to evaluate chronic GERD and anemia.The nature of the procedure, as well as the risks, benefits, and alternatives were carefully and thoroughly reviewed with the patient. Ample time for discussion and questions  allowed. The patient understood, was satisfied, and agreed to proceed. #3. Screening today for celiac disease with tissue transglutaminase antibody IgA and serum IgA level #4. Pending the results of the above may need various therapies for diarrhea if sugar substitute use and ( medications ruled out such as Colestid, Xifaxan, antidiarrheals with fiber ( #5. If anemia persists despite adequate replacement therapies and no obvious cause, consider hematology referral. I will leave this to the discretion of Dr. Renold Genta   A copy of this dictation has been sent to Dr. Renold Genta

## 2017-03-21 ENCOUNTER — Other Ambulatory Visit: Payer: Self-pay

## 2017-03-21 DIAGNOSIS — E138 Other specified diabetes mellitus with unspecified complications: Secondary | ICD-10-CM

## 2017-03-22 MED FILL — ACCU-CHEK GUIDE TEST STRIP: 50 days supply | Qty: 100 | Fill #2

## 2017-03-22 MED FILL — glipiZIDE ER 10 MG TB24: 10 | 90 days supply | Qty: 90 | Fill #3

## 2017-03-22 MED FILL — CYANOCOBALAMIN 1,000 MCG/ML: 1000 | 28 days supply | Qty: 1 | Fill #2

## 2017-04-16 ENCOUNTER — Encounter: Payer: Self-pay | Admitting: *Deleted

## 2017-04-17 ENCOUNTER — Other Ambulatory Visit: Payer: 59 | Admitting: Internal Medicine

## 2017-04-17 ENCOUNTER — Telehealth: Payer: Self-pay | Admitting: Internal Medicine

## 2017-04-17 NOTE — Telephone Encounter (Signed)
Phone message was noted that Aramis cancelled her appointment and rescheduled when her new  insurance begins.   Riki Sheer, LPN ( Admitting )

## 2017-04-18 ENCOUNTER — Encounter: Payer: No Typology Code available for payment source | Admitting: Internal Medicine

## 2017-04-20 ENCOUNTER — Ambulatory Visit: Payer: 59 | Admitting: Internal Medicine

## 2017-05-22 ENCOUNTER — Other Ambulatory Visit: Payer: Self-pay | Admitting: Internal Medicine

## 2017-05-22 NOTE — Telephone Encounter (Signed)
CPE due August. Please schedule before refilling and urge her to reschedule colonoscopy with Dr. Henrene Pastor for evaluation of anemia.

## 2017-05-23 MED FILL — PIOGLITAZONE HCL 45 MG TAB: 45 | 90 days supply | Qty: 90 | Fill #0

## 2017-05-23 MED FILL — VENTOLIN HFA 90 MCG INHALER: 108 (90 BAS | 17 days supply | Qty: 18 | Fill #0

## 2017-05-28 MED FILL — CYANOCOBALAMIN 1,000 MCG/ML: 1000 | 28 days supply | Qty: 1 | Fill #3

## 2017-05-29 ENCOUNTER — Telehealth: Payer: Self-pay | Admitting: *Deleted

## 2017-05-29 NOTE — Telephone Encounter (Signed)
Pt. No showed for her PV appointment today at 1 pm.  I called and left a message for her to call back to reschedule her PV before 5 pm today.  I will cancel both PV and ECL if I do not hear back today by 5 pm.  B.Moritz Lever, CMA  PV

## 2017-05-30 NOTE — Telephone Encounter (Signed)
Pt rescheduled her PV for Friday, 06/01/17 in Room 50. Prep put into computer. Samantha Hahn

## 2017-06-01 ENCOUNTER — Other Ambulatory Visit: Payer: Self-pay

## 2017-06-01 ENCOUNTER — Ambulatory Visit (AMBULATORY_SURGERY_CENTER): Payer: Self-pay | Admitting: *Deleted

## 2017-06-01 VITALS — Ht 67.0 in | Wt 207.0 lb

## 2017-06-01 DIAGNOSIS — K219 Gastro-esophageal reflux disease without esophagitis: Secondary | ICD-10-CM

## 2017-06-01 DIAGNOSIS — D649 Anemia, unspecified: Secondary | ICD-10-CM

## 2017-06-01 NOTE — Progress Notes (Signed)
No egg or soy allergy known to patient  No issues with past sedation with any surgeries  or procedures, no intubation problems  No diet pills per patient No home 02 use per patient  No blood thinners per patient  Pt denies issues with constipation  No A fib or A flutter  EMMI video sent to pt's e mail  - pt declined  pthas a suprep kit at home from 02-2017 she had to cancel

## 2017-06-12 ENCOUNTER — Encounter: Payer: No Typology Code available for payment source | Admitting: Internal Medicine

## 2017-06-13 ENCOUNTER — Encounter: Payer: Self-pay | Admitting: Internal Medicine

## 2017-06-18 ENCOUNTER — Other Ambulatory Visit: Payer: No Typology Code available for payment source | Admitting: Internal Medicine

## 2017-06-18 DIAGNOSIS — E138 Other specified diabetes mellitus with unspecified complications: Secondary | ICD-10-CM | POA: Diagnosis not present

## 2017-06-19 ENCOUNTER — Other Ambulatory Visit: Payer: Self-pay

## 2017-06-19 ENCOUNTER — Encounter: Payer: Self-pay | Admitting: Internal Medicine

## 2017-06-19 ENCOUNTER — Ambulatory Visit (INDEPENDENT_AMBULATORY_CARE_PROVIDER_SITE_OTHER): Payer: No Typology Code available for payment source | Admitting: Internal Medicine

## 2017-06-19 VITALS — BP 115/51 | HR 75 | Temp 97.8°F | Resp 22 | Ht 67.0 in | Wt 207.0 lb

## 2017-06-19 DIAGNOSIS — D122 Benign neoplasm of ascending colon: Secondary | ICD-10-CM

## 2017-06-19 DIAGNOSIS — K253 Acute gastric ulcer without hemorrhage or perforation: Secondary | ICD-10-CM

## 2017-06-19 DIAGNOSIS — K219 Gastro-esophageal reflux disease without esophagitis: Secondary | ICD-10-CM

## 2017-06-19 DIAGNOSIS — Z860100 Personal history of colon polyps, unspecified: Secondary | ICD-10-CM

## 2017-06-19 DIAGNOSIS — R197 Diarrhea, unspecified: Secondary | ICD-10-CM

## 2017-06-19 DIAGNOSIS — K5730 Diverticulosis of large intestine without perforation or abscess without bleeding: Secondary | ICD-10-CM

## 2017-06-19 DIAGNOSIS — D649 Anemia, unspecified: Secondary | ICD-10-CM

## 2017-06-19 LAB — HEMOGLOBIN A1C
Hgb A1c MFr Bld: 6.7 % of total Hgb — ABNORMAL HIGH (ref <5.7)
MEAN PLASMA GLUCOSE: 146 (calc)
eAG (mmol/L): 8.1 (calc)

## 2017-06-19 MED ORDER — PANTOPRAZOLE SODIUM 40 MG PO TBEC: 40.0000 mg | DELAYED_RELEASE_TABLET | Freq: Every day | ORAL | 11 refills | Status: DC

## 2017-06-19 MED ORDER — SODIUM CHLORIDE 0.9 % IV SOLN: 500.0000 mL | INTRAVENOUS | Status: DC

## 2017-06-19 MED FILL — PANTOPRAZOLE SOD DR 40 MG T: 40 | 90 days supply | Qty: 90 | Fill #0

## 2017-06-19 NOTE — Patient Instructions (Signed)
THANK YOU FOR ALLOWING [place] TO TAKE CARE OF YOUR HEALTH CARE NEEDS TODAY!    YOU HAD AN ENDOSCOPIC PROCEDURE TODAY AT [facility]:   Refer to the procedure report that was given to you for any specific questions about what was found during the examination.  If the procedure report does not answer your questions, please call your gastroenterologist to clarify.  If you requested that your care partner not be given the details of your procedure findings, then the procedure report has been included in a sealed envelope for you to review at your convenience later.  YOU SHOULD EXPECT: Some feelings of bloating in the abdomen. Passage of more gas than usual.  Walking can help get rid of the air that was put into your GI tract during the procedure and reduce the bloating. If you had a lower endoscopy (such as a colonoscopy or flexible sigmoidoscopy) you may notice spotting of blood in your stool or on the toilet paper. If you underwent a bowel prep for your procedure, you may not have a normal bowel movement for a few days.  Please Note:  You might notice some irritation and congestion in your nose or some drainage.  This is from the oxygen used during your procedure.  There is no need for concern and it should clear up in a day or so.  SYMPTOMS TO REPORT IMMEDIATELY:   Following lower endoscopy (colonoscopy or flexible sigmoidoscopy):  Excessive amounts of blood in the stool  Significant tenderness or worsening of abdominal pains  Swelling of the abdomen that is new, acute  Fever of 100F or higher   Following upper endoscopy (EGD)  Vomiting of blood or coffee ground material  New chest pain or pain under the shoulder blades  Painful or persistently difficult swallowing  New shortness of breath  Fever of 100F or higher  Black, tarry-looking stools  For urgent or emergent issues, a gastroenterologist can be reached at any hour by calling [phone].   DIET:  We do  recommend a small meal at first, but then you may proceed to your regular diet.  Drink plenty of fluids but you should avoid alcoholic beverages for 24 hours.  ACTIVITY:  You should plan to take it easy for the rest of today and you should NOT DRIVE or use heavy machinery until tomorrow (because of the sedation medicines used during the test).    FOLLOW UP: Our staff will call the number listed on your records the next business day following your procedure to check on you and address any questions or concerns that you may have regarding the information given to you following your procedure. If we do not reach you, we will leave a message.  However, if you are feeling well and you are not experiencing any problems, there is no need to return our call.  We will assume that you have returned to your regular daily activities without incident.  If any biopsies were taken you will be contacted by phone or by letter within the next 1-3 weeks.  Please call us at [phone] if you have not heard about the biopsies in 3 weeks.    SIGNATURES/CONFIDENTIALITY: You and/or your care partner have signed paperwork which will be entered into your electronic medical record.  These signatures attest to the fact that that the information above on your After Visit Summary has been reviewed and is understood.  Full responsibility of the confidentiality of this discharge information  lies with you and/or your care-partner.

## 2017-06-19 NOTE — Progress Notes (Signed)
Pt's states no medical or surgical changes since previsit or office visit. 

## 2017-06-19 NOTE — Progress Notes (Signed)
Called to room to assist during endoscopic procedure.  Patient ID and intended procedure confirmed with present staff. Received instructions for my participation in the procedure from the performing physician.  

## 2017-06-19 NOTE — Op Note (Signed)
[facility] Patient Name: Samantha Hahn Procedure Date: 06/19/2017 8:01 AM MRN: [id number] Endoscopist: Docia Chuck. Henrene Pastor , MD Age: 57 Referring MD:  Date of Birth: 1960-11-29 Gender: Female Account #: [id number] Procedure:                Colonoscopy, with cold snare polypectomy x 1; and                            with biopsies Indications:              High risk colon cancer surveillance: Personal                            history of non-advanced adenoma. Last exam                            September 2013. Borderline iron deficiency anemia Medicines:                Monitored Anesthesia Care Procedure:                Pre-Anesthesia Assessment:                           - Prior to the procedure, a History and Physical                            was performed, and patient medications and                            allergies were reviewed. The patient's tolerance of                            previous anesthesia was also reviewed. The risks                            and benefits of the procedure and the sedation                            options and risks were discussed with the patient.                            All questions were answered, and informed consent                            was obtained. Prior Anticoagulants: The patient has                            taken no previous anticoagulant or antiplatelet                            agents. ASA Grade Assessment: II - A patient with                            mild systemic disease. After reviewing the risks  and benefits, the patient was deemed in                            satisfactory condition to undergo the procedure.                           After obtaining informed consent, the colonoscope                            was passed under direct vision. Throughout the                            procedure, the patient's blood pressure, pulse, and                            oxygen saturations were  monitored continuously. The                            Model CF-HQ190L [id number]) scope was introduced                            through the anus and advanced to the the cecum,                            identified by appendiceal orifice and ileocecal                            valve. The ileocecal valve, appendiceal orifice,                            and rectum were photographed. The quality of the                            bowel preparation was excellent. The colonoscopy                            was performed without difficulty. The patient                            tolerated the procedure well. The bowel preparation                            used was SUPREP. Scope In: 8:22:23 AM Scope Out: 8:38:22 AM Scope Withdrawal Time: 0 hours 13 minutes 44 seconds  Total Procedure Duration: 0 hours 15 minutes 59 seconds  Findings:                 A 5 mm polyp was found in the ascending colon. The                            polyp was sessile. The polyp was removed with a                            cold snare. Resection and retrieval were complete.  Multiple diverticula were found in the sigmoid                            colon.                           The entire examined colon appeared otherwise normal                            on direct and retroflexion views. Biopsies for                            histology were taken with a cold forceps from the                            entire colon for evaluation of microscopic colitis. Complications:            No immediate complications. Estimated blood loss:                            None. Estimated Blood Loss:     Estimated blood loss: none. Impression:               - One 5 mm polyp in the ascending colon, removed                            with a cold snare. Resected and retrieved.                           - Diverticulosis in the sigmoid colon.                           - The entire examined colon is otherwise  normal on                            direct and retroflexion views. Random colon                            biopsies taken. Recommendation:           - Repeat colonoscopy in 5 years for surveillance.                           - Patient has a contact number available for                            emergencies. The signs and symptoms of potential                            delayed complications were discussed with the                            patient. Return to normal activities tomorrow.                            Written discharge  instructions were provided to the                            patient.                           - Resume previous diet.                           - Continue present medications.                           - Await pathology results.                           - Biopsies negative, consider holding metformin                            (supervised by your primary care provider, of                            course) to see if this improves your diarrhea Docia Chuck. Henrene Pastor, MD 06/19/2017 8:59:40 AM This report has been signed electronically.

## 2017-06-19 NOTE — Op Note (Signed)
[facility] Patient Name: Samantha Hahn Procedure Date: 06/19/2017 8:00 AM MRN: [id number] Endoscopist: Docia Chuck. Henrene Pastor , MD Age: 57 Referring MD:  Date of Birth: March 30, 1960 Gender: Female Account #: [id number] Procedure:                Upper GI endoscopy, with biopsies Indications:              Esophageal reflux. Borderline iron deficiency                            anemia. Diarrhea Medicines:                Monitored Anesthesia Care Procedure:                Pre-Anesthesia Assessment:                           - Prior to the procedure, a History and Physical                            was performed, and patient medications and                            allergies were reviewed. The patient's tolerance of                            previous anesthesia was also reviewed. The risks                            and benefits of the procedure and the sedation                            options and risks were discussed with the patient.                            All questions were answered, and informed consent                            was obtained. Prior Anticoagulants: The patient has                            taken no previous anticoagulant or antiplatelet                            agents. ASA Grade Assessment: II - A patient with                            mild systemic disease. After reviewing the risks                            and benefits, the patient was deemed in                            satisfactory condition to undergo the procedure.  After obtaining informed consent, the endoscope was                            passed under direct vision. Throughout the                            procedure, the patient's blood pressure, pulse, and                            oxygen saturations were monitored continuously. The                            Endoscope was introduced through the mouth, and                            advanced to the second part of  duodenum. The upper                            GI endoscopy was accomplished without difficulty.                            The patient tolerated the procedure well. Scope In: Scope Out: Findings:                 The esophagus was normal.                           Multiple small erosions were found in the gastric                            antrum. Biopsies were taken with a cold forceps for                            Helicobacter pylori testing using CLOtest.                           The examined duodenum was normal. Biopsies for                            histology were taken with a cold forceps for                            evaluation of celiac disease.                           The cardia and gastric fundus were normal on                            retroflexion. Complications:            No immediate complications. Estimated Blood Loss:     Estimated blood loss: none. Impression:               - Normal esophagus.                           -  Erosive gastropathy. Biopsied.                           - Normal examined duodenum. Biopsied. Recommendation:           1. Follow-up CLO biopsy and treat if positive                           2. Follow-up duodenal biopsies to rule out celiac                            disease                           3. Prescribe pantoprazole 40 mg daily; #30; 11                            refills (as you have erosions and are on a daily                            aspirin). This will reduce the risk of future ulcer                            formation. Docia Chuck. Henrene Pastor, MD 06/19/2017 9:05:29 AM This report has been signed electronically.

## 2017-06-19 NOTE — Progress Notes (Signed)
A/ox3 pleased with MAC, report to RN 

## 2017-06-20 ENCOUNTER — Telehealth: Payer: Self-pay

## 2017-06-20 ENCOUNTER — Telehealth: Payer: Self-pay | Admitting: Internal Medicine

## 2017-06-20 ENCOUNTER — Telehealth: Payer: Self-pay | Admitting: *Deleted

## 2017-06-20 LAB — HELICOBACTER PYLORI SCREEN-BIOPSY: UREASE: NEGATIVE

## 2017-06-20 NOTE — Telephone Encounter (Signed)
Pt had procedure yesterday, looks like you tried to call her.

## 2017-06-20 NOTE — Telephone Encounter (Signed)
  Follow up Call-  Call back number 06/19/2017  Post procedure Call Back phone  # [phone]  Permission to leave phone message Yes  Some recent data might be hidden     ID on voicemail does not match.  No message left.  Will try again this pm. Oneill Bais/Call-back [facility]

## 2017-06-20 NOTE — Telephone Encounter (Signed)
  Follow up Call-  Call back number 06/19/2017  Post procedure Call Back phone  # [phone]  Permission to leave phone message Yes  Some recent data might be hidden     Patient questions:  Do you have a fever, pain , or abdominal swelling? No. Pain Score  0 *  Have you tolerated food without any problems? No.  Have you been able to return to your normal activities? Yes.    Do you have any questions about your discharge instructions: Diet   No. Medications  No. Follow up visit  No.  Do you have questions or concerns about your Care? No.  Actions: * If pain score is 4 or above: No action needed, pain <4.

## 2017-06-22 ENCOUNTER — Encounter: Payer: Self-pay | Admitting: Internal Medicine

## 2017-06-22 ENCOUNTER — Ambulatory Visit (INDEPENDENT_AMBULATORY_CARE_PROVIDER_SITE_OTHER): Payer: No Typology Code available for payment source | Admitting: Internal Medicine

## 2017-06-22 ENCOUNTER — Telehealth: Payer: Self-pay

## 2017-06-22 VITALS — BP 98/72 | HR 108 | Temp 97.9°F | Ht 67.0 in | Wt 199.0 lb

## 2017-06-22 DIAGNOSIS — R197 Diarrhea, unspecified: Secondary | ICD-10-CM | POA: Diagnosis not present

## 2017-06-22 DIAGNOSIS — E119 Type 2 diabetes mellitus without complications: Secondary | ICD-10-CM | POA: Diagnosis not present

## 2017-06-22 DIAGNOSIS — R634 Abnormal weight loss: Secondary | ICD-10-CM | POA: Diagnosis not present

## 2017-06-22 DIAGNOSIS — R110 Nausea: Secondary | ICD-10-CM

## 2017-06-22 LAB — IRON,TIBC AND FERRITIN PANEL
%SAT: 21 % (calc) (ref 11–50)
FERRITIN: 78 ng/mL (ref 10–232)
IRON: 56 ug/dL (ref 45–160)
TIBC: 265 ug/dL (ref 250–450)

## 2017-06-22 LAB — FOLATE: Folate: 18.3 ng/mL

## 2017-06-22 LAB — VITAMIN B12: Vitamin B-12: 964 pg/mL (ref 200–1100)

## 2017-06-22 NOTE — Progress Notes (Signed)
   Subjective:    Patient ID: Samantha Hahn, female    DOB: January 15, 1961, 57 y.o.   MRN: [id number]  HPI 57 year old  Female for follow-up of Diabetes mellitus.  Hemoglobin A1c is 6.7% and previously 6 months ago was 6.1%.  She has lost some weight.  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  She had recent colonoscopy and had tubular adenomas but no evidence of colitis.  Test for Helicobacter pylori was negative.   Celiac sprue tests were done in January and were negative.  Her iron level is low normal at 56, hemoglobin 11.9 g in 6 months ago was 9.3 g with clear iron deficiency.  B12 and folate levels are normal.  Recent hemoglobin A1c 6.7% and previously was 6.1% 6 months ago.  Studies for celiac sprue negative.  History of depression but denies being very depressed just situational stress with her parents    Review of Systems headaches are stable, issues with diarrhea for some 8 months.  Vague abdominal pain.     Objective:   Physical Exam Today weighs 199 pounds in November 2018 weight 214 pounds.  In November 2017 she weighed 248 pounds.  In August 2017 she weighed 252 pounds.  Abdomen no hepatosplenomegaly masses or tenderness     Assessment & Plan:  Well-controlled diabetes mellitus  History of iron deficiency  Weight loss-etiology unclear, arrange for patient to have CT scan of the abdomen and pelvis for evaluation of chronic abdominal pain and diarrhea  Addendum: No evidence of acute abnormality to explain symptoms.  She will continue be followed by gastroenterologist.  She will return here in 6 months.  Continue iron supplement.

## 2017-06-22 NOTE — Telephone Encounter (Signed)
CT ABD OF PELVIS WITH CONTRAST was approved by patient's insurance. Auth#[id number]

## 2017-06-23 LAB — CBC WITH DIFFERENTIAL/PLATELET
BASOS ABS: 19 {cells}/uL (ref 0–200)
BASOS PCT: 0.5 %
Eosinophils Absolute: 211 cells/uL (ref 15–500)
Eosinophils Relative: 5.7 %
HEMATOCRIT: 35.7 % (ref 35.0–45.0)
HEMOGLOBIN: 11.9 g/dL (ref 11.7–15.5)
LYMPHS ABS: 966 {cells}/uL (ref 850–3900)
MCH: 30.4 pg (ref 27.0–33.0)
MCHC: 33.3 g/dL (ref 32.0–36.0)
MCV: 91.3 fL (ref 80.0–100.0)
MONOS PCT: 11.1 %
MPV: 11.3 fL (ref 7.5–12.5)
NEUTROS ABS: 2094 {cells}/uL (ref 1500–7800)
Neutrophils Relative %: 56.6 %
Platelets: 142 10*3/uL (ref 140–400)
RBC: 3.91 10*6/uL (ref 3.80–5.10)
RDW: 12 % (ref 11.0–15.0)
Total Lymphocyte: 26.1 %
WBC: 3.7 10*3/uL — ABNORMAL LOW (ref 3.8–10.8)
WBCMIX: 411 {cells}/uL (ref 200–950)

## 2017-06-26 ENCOUNTER — Encounter: Payer: Self-pay | Admitting: Internal Medicine

## 2017-06-27 MED FILL — CYANOCOBALAMIN 1,000 MCG/ML: 1000 | 28 days supply | Qty: 1 | Fill #4

## 2017-06-27 MED FILL — ATORVASTATIN 20 MG TABLET: 20 | 90 days supply | Qty: 90 | Fill #1

## 2017-06-28 ENCOUNTER — Other Ambulatory Visit: Payer: Self-pay

## 2017-06-29 ENCOUNTER — Ambulatory Visit
Admission: RE | Admit: 2017-06-29 | Discharge: 2017-06-29 | Disposition: A | Discharge status: Home / Self Care | Payer: No Typology Code available for payment source | Source: Ambulatory Visit | Attending: Internal Medicine | Admitting: Internal Medicine

## 2017-06-29 ENCOUNTER — Other Ambulatory Visit: Payer: Self-pay | Admitting: Internal Medicine

## 2017-06-29 DIAGNOSIS — R197 Diarrhea, unspecified: Secondary | ICD-10-CM

## 2017-06-29 DIAGNOSIS — R634 Abnormal weight loss: Secondary | ICD-10-CM

## 2017-06-29 DIAGNOSIS — R110 Nausea: Secondary | ICD-10-CM

## 2017-06-29 DIAGNOSIS — E119 Type 2 diabetes mellitus without complications: Secondary | ICD-10-CM

## 2017-07-03 ENCOUNTER — Other Ambulatory Visit: Payer: Self-pay | Admitting: Internal Medicine

## 2017-07-17 ENCOUNTER — Other Ambulatory Visit: Payer: Self-pay | Admitting: Internal Medicine

## 2017-07-17 MED FILL — metFORMIN HCL 1000 MG TABS: 1000 | 90 days supply | Qty: 180 | Fill #1

## 2017-07-17 MED FILL — MONTELUKAST SOD 10 MG TAB: 10 | 90 days supply | Qty: 90 | Fill #1

## 2017-07-17 MED FILL — ESCITALOPRAM 10 MG TABLET: 10 | 90 days supply | Qty: 90 | Fill #0

## 2017-07-17 MED FILL — DOXYCYCLINE HYCLATE 100 MG: 100 | 90 days supply | Qty: 90 | Fill #0

## 2017-07-19 NOTE — Patient Instructions (Addendum)
To have CT of the abdomen and pelvis in the near future for diarrhea and weight loss.  Continue same medications.  Physical exam due in 6 months.  Continue follow-up with gastroenterologist.  Continue iron supplement.

## 2017-08-22 MED FILL — CYANOCOBALAMIN 1,000 MCG/ML: 1000 | 28 days supply | Qty: 1 | Fill #5

## 2017-09-25 ENCOUNTER — Other Ambulatory Visit: Payer: Self-pay | Admitting: Internal Medicine

## 2017-09-25 DIAGNOSIS — E538 Deficiency of other specified B group vitamins: Secondary | ICD-10-CM

## 2017-09-25 MED FILL — BUPROPION HCL XL 300 MG TAB: 300 | 90 days supply | Qty: 90 | Fill #1

## 2017-09-25 MED FILL — METOPROLOL SUCCINATE ER 50: 50 | 90 days supply | Qty: 90 | Fill #2

## 2017-09-25 MED FILL — CYANOCOBALAMIN 1,000 MCG/ML: 1000 | 28 days supply | Qty: 1 | Fill #0

## 2017-10-11 ENCOUNTER — Ambulatory Visit: Payer: No Typology Code available for payment source | Admitting: Internal Medicine

## 2017-10-18 ENCOUNTER — Ambulatory Visit: Payer: No Typology Code available for payment source | Admitting: Internal Medicine

## 2017-10-26 ENCOUNTER — Other Ambulatory Visit: Payer: Self-pay | Admitting: Internal Medicine

## 2017-10-26 ENCOUNTER — Encounter: Payer: Self-pay | Admitting: Internal Medicine

## 2017-10-26 ENCOUNTER — Ambulatory Visit (INDEPENDENT_AMBULATORY_CARE_PROVIDER_SITE_OTHER): Payer: No Typology Code available for payment source | Admitting: Internal Medicine

## 2017-10-26 VITALS — BP 110/70 | HR 85 | Temp 98.3°F | Ht 67.0 in | Wt 199.0 lb

## 2017-10-26 DIAGNOSIS — R450 Nervousness: Secondary | ICD-10-CM

## 2017-10-26 DIAGNOSIS — E162 Hypoglycemia, unspecified: Secondary | ICD-10-CM | POA: Diagnosis not present

## 2017-10-26 DIAGNOSIS — Z23 Encounter for immunization: Secondary | ICD-10-CM

## 2017-10-26 DIAGNOSIS — E119 Type 2 diabetes mellitus without complications: Secondary | ICD-10-CM

## 2017-10-26 DIAGNOSIS — R251 Tremor, unspecified: Secondary | ICD-10-CM | POA: Diagnosis not present

## 2017-10-26 LAB — POCT GLUCOSE (DEVICE FOR HOME USE): POC Glucose: 195 mg/dl — AB (ref 70–99)

## 2017-10-26 MED ORDER — PIOGLITAZONE HCL 30 MG PO TABS: 30.0000 mg | ORAL_TABLET | Freq: Every day | ORAL | 1 refills | Status: DC

## 2017-10-26 MED ORDER — ESCITALOPRAM OXALATE 20 MG PO TABS: 20.0000 mg | ORAL_TABLET | Freq: Every day | ORAL | 0 refills | Status: DC

## 2017-10-26 MED FILL — PIOGLITAZONE HCL 30 MG TAB: 30 | 30 days supply | Qty: 30 | Fill #0

## 2017-10-26 MED FILL — PANTOPRAZOLE SOD DR 40 MG T: 40 | 90 days supply | Qty: 90 | Fill #1

## 2017-10-26 MED FILL — CYANOCOBALAMIN 1,000 MCG/ML: 1000 | 28 days supply | Qty: 1 | Fill #1

## 2017-10-26 MED FILL — ESCITALOPRAM 20 MG TABLET: 20 | 90 days supply | Qty: 90 | Fill #0

## 2017-10-26 NOTE — Telephone Encounter (Signed)
Please call her. I thought we might have to adjust her meds if she is having hypoglycemia. Did not come yesterday as planned?

## 2017-10-26 NOTE — Progress Notes (Signed)
   Subjective:    Patient ID: Samantha Hahn, female    DOB: 1960/07/22, 57 y.o.   MRN: [id number]  HPI Patient continues to take care of elderly parents.  She is thinking of moving in with them in the near future.  She spends a lot of time with them.  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 Currently on glipizide 10 mg XL daily.  Has recorded low blood Accu-Cheks on several occasions and is symptomatic with weakness jitteriness and shakiness.  Hemoglobin A1c in April was 6.7%.  Glucose today is 195.  Flu vaccine given today Review of Systems see above     Objective:   Physical Exam  Chest clear.  Cardiac exam: regular rate and rhythm Extremities without edema.  Vital signs reviewed.  Weight is stable at 199 pounds.      Assessment & Plan:  Episodes of symptomatic hypoglycemia on oral medication  Plan: Reduce glipizide to 5 mg daily and call with progress report.  Health maintenance exam last done August 2018.

## 2017-10-29 ENCOUNTER — Other Ambulatory Visit: Payer: Self-pay | Admitting: Internal Medicine

## 2017-10-29 MED FILL — ATORVASTATIN CALCIUM 20 MG: 20 | 90 days supply | Qty: 90 | Fill #0

## 2017-10-31 ENCOUNTER — Other Ambulatory Visit: Payer: Self-pay | Admitting: Internal Medicine

## 2017-10-31 DIAGNOSIS — E119 Type 2 diabetes mellitus without complications: Secondary | ICD-10-CM

## 2017-10-31 MED FILL — ACCU-CHEK GUIDE STRP: 50 days supply | Qty: 100 | Fill #0

## 2017-11-01 MED ORDER — GLIPIZIDE ER 10 MG PO TB24: 10.0000 mg | ORAL_TABLET | Freq: Every day | ORAL | 3 refills | Status: DC

## 2017-11-01 MED FILL — glipiZIDE ER 10 MG TB24: 10 | 90 days supply | Qty: 90 | Fill #0

## 2017-11-01 NOTE — Addendum Note (Signed)
Addended by: Mady Haagensen on: 11/01/2017 05:07 PM   Modules accepted: Orders

## 2017-11-15 ENCOUNTER — Ambulatory Visit: Payer: No Typology Code available for payment source | Admitting: Podiatry

## 2017-11-15 ENCOUNTER — Other Ambulatory Visit: Payer: Self-pay

## 2017-11-15 ENCOUNTER — Encounter: Payer: Self-pay | Admitting: Podiatry

## 2017-11-15 VITALS — BP 125/71 | HR 54 | Resp 16

## 2017-11-15 DIAGNOSIS — L600 Ingrowing nail: Secondary | ICD-10-CM

## 2017-11-15 DIAGNOSIS — B351 Tinea unguium: Secondary | ICD-10-CM | POA: Diagnosis not present

## 2017-11-15 MED ORDER — METOPROLOL SUCCINATE ER 50 MG PO TB24: 50.0000 mg | ORAL_TABLET | Freq: Every day | ORAL | 3 refills | Status: DC

## 2017-11-15 NOTE — Progress Notes (Signed)
Subjective:   Patient ID: Samantha Hahn, female   DOB: 57 y.o.   MRN: [id number]   HPI Patient presents stating that the left big toenail came off and she was concerned with a history of diabetes and has had thickness of the left nail and the fifth nail.  Patient does not smoke and likes to be active   Review of Systems  All other systems reviewed and are negative.       Objective:  Physical Exam  Constitutional: She appears well-developed and well-nourished.  Cardiovascular: Intact distal pulses.  Pulmonary/Chest: Effort normal.  Musculoskeletal: Normal range of motion.  Neurological: She is alert.  Skin: Skin is warm.  Nursing note and vitals reviewed.   Neurovascular status found to be intact muscle strength is adequate range of motion was within normal limits with patient found to have a moderately thickened left big toenail that has lost its dorsal surface with a new nail growing underneath it that appears relatively healthy with no drainage or erythema present     Assessment:  Traumatized left hallux nail which appears to be healing okay with no indications of infection     Plan:  H&P and education concerning nail disease given the patient with recommendation of watching this and if it were to become painful grow abnormally or start to drain will have to consider nail removal.  Patient will be seen back as needed

## 2017-11-15 NOTE — Progress Notes (Signed)
   Subjective:    Patient ID: Samantha Hahn, female    DOB: May 07, 1960, 57 y.o.   MRN: [id number]  HPI    Review of Systems  All other systems reviewed and are negative.      Objective:   Physical Exam        Assessment & Plan:

## 2017-11-16 MED FILL — CYANOCOBALAMIN 1,000 MCG/ML: 1000 | 28 days supply | Qty: 1 | Fill #2

## 2017-11-19 NOTE — Patient Instructions (Signed)
Reduce glipizide in half and call with progress report in a week or 2.  Due for physical exam in the near future.  Flu vaccine given.

## 2017-11-25 MED FILL — PIOGLITAZONE HCL 30 MG TAB: 30 | 30 days supply | Qty: 30 | Fill #1

## 2017-11-26 ENCOUNTER — Telehealth: Payer: Self-pay | Admitting: Internal Medicine

## 2017-11-26 MED FILL — DOXYCYCLINE HYCLATE 100 MG: 100 | 90 days supply | Qty: 90 | Fill #1

## 2017-11-26 NOTE — Telephone Encounter (Signed)
Patient calling in with an update:  You changed her medications.  Decreased Actos from 45mg  to 30mg .  Stopped Metformin.    Readings are:  Initially 121 to 148.   Now, over the past week, 105 - 113.   She takes the Glipizide in the morning and Actos at night.  She checks her sugar first thing in the a.m. As a fasting blood sugar.    When do you want her to follow up with you?    Phone:  [phone]

## 2017-11-26 NOTE — Telephone Encounter (Signed)
SCHEDULED

## 2017-11-26 NOTE — Telephone Encounter (Signed)
Last CPE August 2018. Please book soon and can follow up then.

## 2017-12-14 ENCOUNTER — Other Ambulatory Visit: Payer: Self-pay | Admitting: Internal Medicine

## 2017-12-14 MED FILL — MONTELUKAST SOD 10 MG TAB: 10 | 90 days supply | Qty: 90 | Fill #0

## 2017-12-14 NOTE — Telephone Encounter (Signed)
Verbal order by Dr. Renold Genta to refill Singulair 10mg .  Take 1 tablet by mouth every morning.  #90, Refills 2.  Spoke with Minna Merritts @ [facility] @ [phone].

## 2017-12-20 NOTE — Telephone Encounter (Signed)
Patient was seen on 9/6 @ 2:45 p.m.   Ok to sign/close.

## 2017-12-25 ENCOUNTER — Other Ambulatory Visit: Payer: Self-pay | Admitting: Internal Medicine

## 2017-12-25 DIAGNOSIS — Z1231 Encounter for screening mammogram for malignant neoplasm of breast: Secondary | ICD-10-CM

## 2017-12-27 ENCOUNTER — Ambulatory Visit: Payer: Self-pay

## 2017-12-31 MED FILL — CYANOCOBALAMIN 1,000 MCG/ML: 1000 | 28 days supply | Qty: 1 | Fill #3

## 2018-01-09 ENCOUNTER — Other Ambulatory Visit: Payer: Self-pay | Admitting: Internal Medicine

## 2018-01-09 MED FILL — PIOGLITAZONE HCL 30 MG TAB: 30 | 30 days supply | Qty: 30 | Fill #0

## 2018-01-14 MED FILL — METOPROLOL SUCCINATE ER 50: 50 | 90 days supply | Qty: 90 | Fill #0

## 2018-01-24 ENCOUNTER — Other Ambulatory Visit: Payer: Self-pay | Admitting: Internal Medicine

## 2018-01-25 MED FILL — buPROPion HCL ER (XL) 300 M: 300 | 90 days supply | Qty: 90 | Fill #0

## 2018-02-04 MED FILL — CYANOCOBALAMIN 1,000 MCG/ML: 1000 | 28 days supply | Qty: 1 | Fill #4

## 2018-02-14 ENCOUNTER — Other Ambulatory Visit: Payer: Self-pay | Admitting: Internal Medicine

## 2018-02-14 LAB — HM DIABETES EYE EXAM

## 2018-02-26 ENCOUNTER — Ambulatory Visit: Payer: Self-pay

## 2018-02-28 ENCOUNTER — Other Ambulatory Visit: Payer: Self-pay | Admitting: Internal Medicine

## 2018-02-28 MED FILL — ESCITALOPRAM 20 MG TABLET: 20 | 90 days supply | Qty: 90 | Fill #0

## 2018-02-28 MED FILL — glipiZIDE ER 10 MG TB24: 10 | 90 days supply | Qty: 90 | Fill #1

## 2018-02-28 MED FILL — PIOGLITAZONE HCL 30 MG TAB: 30 | 30 days supply | Qty: 30 | Fill #1

## 2018-02-28 MED FILL — PANTOPRAZOLE SOD DR 40 MG T: 40 | 90 days supply | Qty: 90 | Fill #2

## 2018-03-04 ENCOUNTER — Ambulatory Visit (INDEPENDENT_AMBULATORY_CARE_PROVIDER_SITE_OTHER): Payer: No Typology Code available for payment source | Admitting: Internal Medicine

## 2018-03-04 DIAGNOSIS — K219 Gastro-esophageal reflux disease without esophagitis: Secondary | ICD-10-CM

## 2018-03-04 DIAGNOSIS — I10 Essential (primary) hypertension: Secondary | ICD-10-CM

## 2018-03-04 DIAGNOSIS — E119 Type 2 diabetes mellitus without complications: Secondary | ICD-10-CM

## 2018-03-04 DIAGNOSIS — Z0000 Encounter for general adult medical examination without abnormal findings: Secondary | ICD-10-CM

## 2018-03-04 DIAGNOSIS — E781 Pure hyperglyceridemia: Secondary | ICD-10-CM

## 2018-03-04 DIAGNOSIS — D649 Anemia, unspecified: Secondary | ICD-10-CM

## 2018-03-04 DIAGNOSIS — N183 Chronic kidney disease, stage 3 (moderate): Secondary | ICD-10-CM

## 2018-03-05 ENCOUNTER — Ambulatory Visit (INDEPENDENT_AMBULATORY_CARE_PROVIDER_SITE_OTHER): Payer: No Typology Code available for payment source | Admitting: Internal Medicine

## 2018-03-05 DIAGNOSIS — Z0000 Encounter for general adult medical examination without abnormal findings: Secondary | ICD-10-CM

## 2018-03-05 DIAGNOSIS — E781 Pure hyperglyceridemia: Secondary | ICD-10-CM

## 2018-03-05 DIAGNOSIS — K219 Gastro-esophageal reflux disease without esophagitis: Secondary | ICD-10-CM

## 2018-03-05 DIAGNOSIS — N183 Chronic kidney disease, stage 3 unspecified: Secondary | ICD-10-CM

## 2018-03-05 DIAGNOSIS — I10 Essential (primary) hypertension: Secondary | ICD-10-CM

## 2018-03-05 DIAGNOSIS — D649 Anemia, unspecified: Secondary | ICD-10-CM

## 2018-03-05 DIAGNOSIS — E119 Type 2 diabetes mellitus without complications: Secondary | ICD-10-CM

## 2018-03-06 LAB — CBC WITH DIFFERENTIAL/PLATELET
Absolute Monocytes: 359 cells/uL (ref 200–950)
Basophils Absolute: 42 cells/uL (ref 0–200)
Basophils Relative: 0.8 %
EOS PCT: 3.5 %
Eosinophils Absolute: 182 cells/uL (ref 15–500)
HCT: 34.7 % — ABNORMAL LOW (ref 35.0–45.0)
HEMOGLOBIN: 11.6 g/dL — AB (ref 11.7–15.5)
Lymphs Abs: 926 cells/uL (ref 850–3900)
MCH: 30.7 pg (ref 27.0–33.0)
MCHC: 33.4 g/dL (ref 32.0–36.0)
MCV: 91.8 fL (ref 80.0–100.0)
MONOS PCT: 6.9 %
MPV: 11.3 fL (ref 7.5–12.5)
NEUTROS ABS: 3692 {cells}/uL (ref 1500–7800)
Neutrophils Relative %: 71 %
Platelets: 156 10*3/uL (ref 140–400)
RBC: 3.78 10*6/uL — AB (ref 3.80–5.10)
RDW: 11.8 % (ref 11.0–15.0)
Total Lymphocyte: 17.8 %
WBC: 5.2 10*3/uL (ref 3.8–10.8)

## 2018-03-06 LAB — TSH: TSH: 0.99 mIU/L (ref 0.40–4.50)

## 2018-03-06 LAB — COMPLETE METABOLIC PANEL WITH GFR
AG Ratio: 1.4 (calc) (ref 1.0–2.5)
ALBUMIN MSPROF: 3.8 g/dL (ref 3.6–5.1)
ALT: 21 U/L (ref 6–29)
AST: 23 U/L (ref 10–35)
Alkaline phosphatase (APISO): 77 U/L (ref 33–130)
BUN/Creatinine Ratio: 15 (calc) (ref 6–22)
BUN: 51 mg/dL — AB (ref 7–25)
CALCIUM: 10.3 mg/dL (ref 8.6–10.4)
CHLORIDE: 107 mmol/L (ref 98–110)
CO2: 30 mmol/L (ref 20–32)
Creat: 3.36 mg/dL — ABNORMAL HIGH (ref 0.50–1.05)
GFR, EST AFRICAN AMERICAN: 17 mL/min/{1.73_m2} — AB (ref >=60)
GFR, Est Non African American: 14 mL/min/{1.73_m2} — ABNORMAL LOW (ref >=60)
GLUCOSE: 111 mg/dL — AB (ref 65–99)
Globulin: 2.8 g/dL (calc) (ref 1.9–3.7)
POTASSIUM: 4.8 mmol/L (ref 3.5–5.3)
Sodium: 144 mmol/L (ref 135–146)
Total Bilirubin: 0.8 mg/dL (ref 0.2–1.2)
Total Protein: 6.6 g/dL (ref 6.1–8.1)

## 2018-03-06 LAB — HEMOGLOBIN A1C
Hgb A1c MFr Bld: 6.9 % of total Hgb — ABNORMAL HIGH (ref <5.7)
Mean Plasma Glucose: 151 (calc)
eAG (mmol/L): 8.4 (calc)

## 2018-03-06 LAB — LIPID PANEL
Cholesterol: 148 mg/dL (ref <200)
HDL: 42 mg/dL — ABNORMAL LOW (ref >50)
LDL CHOLESTEROL (CALC): 83 mg/dL
Non-HDL Cholesterol (Calc): 106 mg/dL (calc) (ref <130)
TRIGLYCERIDES: 130 mg/dL (ref <150)
Total CHOL/HDL Ratio: 3.5 (calc) (ref <5.0)

## 2018-03-06 LAB — VITAMIN D 25 HYDROXY (VIT D DEFICIENCY, FRACTURES): Vit D, 25-Hydroxy: 48 ng/mL (ref 30–100)

## 2018-03-08 ENCOUNTER — Ambulatory Visit (INDEPENDENT_AMBULATORY_CARE_PROVIDER_SITE_OTHER): Payer: No Typology Code available for payment source | Admitting: Internal Medicine

## 2018-03-08 ENCOUNTER — Encounter: Payer: Self-pay | Admitting: Internal Medicine

## 2018-03-08 VITALS — BP 102/70 | HR 72 | Ht 67.0 in | Wt 208.0 lb

## 2018-03-08 DIAGNOSIS — Z0000 Encounter for general adult medical examination without abnormal findings: Secondary | ICD-10-CM

## 2018-03-08 DIAGNOSIS — N183 Chronic kidney disease, stage 3 unspecified: Secondary | ICD-10-CM

## 2018-03-08 DIAGNOSIS — E119 Type 2 diabetes mellitus without complications: Secondary | ICD-10-CM | POA: Diagnosis not present

## 2018-03-08 DIAGNOSIS — I10 Essential (primary) hypertension: Secondary | ICD-10-CM | POA: Diagnosis not present

## 2018-03-08 DIAGNOSIS — K219 Gastro-esophageal reflux disease without esophagitis: Secondary | ICD-10-CM | POA: Diagnosis not present

## 2018-03-08 DIAGNOSIS — Z8669 Personal history of other diseases of the nervous system and sense organs: Secondary | ICD-10-CM

## 2018-03-08 DIAGNOSIS — Z8709 Personal history of other diseases of the respiratory system: Secondary | ICD-10-CM

## 2018-03-08 DIAGNOSIS — F3170 Bipolar disorder, currently in remission, most recent episode unspecified: Secondary | ICD-10-CM

## 2018-03-08 LAB — POCT URINALYSIS DIPSTICK
APPEARANCE: NEGATIVE
Bilirubin, UA: NEGATIVE
Blood, UA: NEGATIVE
Glucose, UA: NEGATIVE
Ketones, UA: NEGATIVE
LEUKOCYTES UA: NEGATIVE
NITRITE UA: NEGATIVE
Odor: NEGATIVE
PROTEIN UA: NEGATIVE
SPEC GRAV UA: 1.015 (ref 1.010–1.025)
Urobilinogen, UA: 0.2 E.U./dL
pH, UA: 7.5 (ref 5.0–8.0)

## 2018-03-08 NOTE — Progress Notes (Signed)
Subjective:    Patient ID: Samantha Hahn, female    DOB: [birth year], 59 y.o.   MRN: [id number]  HPI 58 year old Female for health maintenance exam and evalaution of medical issues. Hx CKD seen by Dr. Moshe Cipro at [facility].  She has a history of diabetes mellitus, essential hypertension, migraine headaches, asthma, depression, insomnia, allergic rhinitis, acne rosacea, sleep apnea, bipolar disorder, PVCs.  Status post vaginal hysterectomy 2006.  After that she had consult with gastroenterologist for diarrhea, nausea, weight loss and abdominal bloating.  Symptoms seem to start after surgery.  She was treated with Nexium and a 5-day course of Flagyl with improvement.  History of allergic rhinitis with positive skin test to grasses, weeds, trees, dog and house dust mite.  Testing was done by Dr. Neldon Mc in 2001.  She also had spirometry showing small airways disease.  Following albuterol, FEV1 did not change significantly.  Has been diagnosed with asthma.  History of GE reflux with a component of reflux induced respiratory disease.  In 2011 she fell at home and had a brief loss of consciousness.  Was diagnosed with minor head injury.  CT of the spine and head were normal.  Sees GYN physician regularly.  Had colonoscopy in 2013 and had an adenomatous colon polyp.  Colonoscopy was done by Dr. Henrene Pastor.  In April 2019 Dr. Henrene Pastor did endoscopy showing multiple small erosions in the gastric antrum.  In May 2019 had colonoscopy due to weight loss and had an adenomatous polyp in the ascending colon.  In November 2018 was found to be anemic with hemoglobin 9.3 g with iron level of 53 normal being between 45 and 160  Recent hemoglobin A1c was 6.9% and in April was 6.7%.  In July 2018 hemoglobin A1c was 6%.  Receives influenza vaccine through employment at [employer].  In 2013 she had adhesive capsulitis of the left shoulder and had left shoulder released by Dr. Onnie Graham.  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  Does not smoke or consume alcohol.  She has 1 adult daughter.  She previously worked as a Community education officer at [employer].  She now is caring for elderly parents in their home.  Family history: Brother with history of hypertrophic cardiomyopathy.  One sister in good health.  Mother with history of stroke, heart disease, kidney stones as well as diabetes.  Father with history of diabetes.    Review of Systems history of migraine headaches otherwise negative     Objective:   Physical Exam Vitals signs reviewed.  Constitutional:      Appearance: Normal appearance. She is obese.  HENT:     Head: Normocephalic and atraumatic.     Right Ear: Tympanic membrane and ear canal normal.     Left Ear: Tympanic membrane and ear canal normal.     Nose: Nose normal.     Mouth/Throat:     Mouth: Mucous membranes are moist.     Pharynx: Oropharynx is clear.  Eyes:     General: No scleral icterus.       Right eye: No discharge.        Left eye: No discharge.     Conjunctiva/sclera: Conjunctivae normal.     Pupils: Pupils are equal, round, and reactive to light.  Neck:     Musculoskeletal: Neck supple. No neck rigidity.  Cardiovascular:     Rate and Rhythm: Normal rate and regular rhythm.     Pulses:  Normal pulses.     Heart sounds: Normal heart sounds. No murmur.  Pulmonary:     Effort: Pulmonary effort is normal. No respiratory distress.     Breath sounds: Normal breath sounds. No wheezing or rales.     Comments: Breasts normal female Abdominal:     General: There is no distension.     Palpations: Abdomen is soft. There is no mass.     Tenderness: There is no guarding or rebound.  Genitourinary:    Comments: Bimanual normal Musculoskeletal:        General: No deformity.     Right lower leg: No edema.     Left lower leg: No edema.  Lymphadenopathy:     Cervical: No cervical adenopathy.  Skin:    General: Skin is warm and dry.     Findings:  No rash.  Neurological:     General: No focal deficit present.     Mental Status: She is alert and oriented to person, place, and time.     Cranial Nerves: No cranial nerve deficit.     Sensory: No sensory deficit.  Psychiatric:        Mood and Affect: Mood normal.        Behavior: Behavior normal.        Thought Content: Thought content normal.        Judgment: Judgment normal.           Assessment & Plan:  Type 2 diabetes mellitus-hemoglobin A1c stable under 7% but would benefit greatly from diet and exercise  History of bipolar disorder  Allergic rhinitis  Sleep apnea  History of adenomatous colon polyp  Chronic kidney disease stage III followed by nephrology  History of iron deficiency anemia-resolved with GI work-up showing some gastric erosions  Essential hypertension-stable  GE reflux-treated with PPI  Plan: Continue current regimen and follow-up in 6 months.

## 2018-03-09 LAB — MICROALBUMIN / CREATININE URINE RATIO
Creatinine, Urine: 116 mg/dL (ref 20–275)
MICROALB UR: 1.3 mg/dL
Microalb Creat Ratio: 11 mcg/mg creat (ref <30)

## 2018-03-09 LAB — BASIC METABOLIC PANEL
BUN/Creatinine Ratio: 16 (calc) (ref 6–22)
BUN: 59 mg/dL — AB (ref 7–25)
CO2: 25 mmol/L (ref 20–32)
CREATININE: 3.69 mg/dL — AB (ref 0.50–1.05)
Calcium: 8.7 mg/dL (ref 8.6–10.4)
Chloride: 107 mmol/L (ref 98–110)
Glucose, Bld: 135 mg/dL — ABNORMAL HIGH (ref 65–99)
POTASSIUM: 4.8 mmol/L (ref 3.5–5.3)
Sodium: 142 mmol/L (ref 135–146)

## 2018-03-12 ENCOUNTER — Telehealth: Payer: Self-pay

## 2018-03-12 NOTE — Telephone Encounter (Signed)
Patient was contacted about lab results, she said she will call Dr. Moshe Cipro at [facility] herself to make an appointment.

## 2018-03-17 NOTE — Progress Notes (Signed)
No show

## 2018-03-19 ENCOUNTER — Encounter: Payer: Self-pay | Admitting: Internal Medicine

## 2018-03-20 ENCOUNTER — Telehealth: Payer: Self-pay | Admitting: Internal Medicine

## 2018-03-20 MED FILL — ATORVASTATIN CALCIUM 20 MG: 20 | 90 days supply | Qty: 90 | Fill #1

## 2018-03-20 MED FILL — DOXYCYCLINE HYCLATE 100 MG: 100 | 90 days supply | Qty: 90 | Fill #2

## 2018-03-20 NOTE — Telephone Encounter (Signed)
Patient was referred over to [facility].  Appointment 03/18/18.  Patient obtained [facility] Referral [id number].  Effective 03/14/18 - 03/15/19.

## 2018-04-01 MED FILL — CYANOCOBALAMIN 1,000 MCG/ML: 1000 | 28 days supply | Qty: 1 | Fill #5

## 2018-04-04 ENCOUNTER — Other Ambulatory Visit: Payer: Self-pay | Admitting: Nephrology

## 2018-04-04 DIAGNOSIS — N183 Chronic kidney disease, stage 3 unspecified: Secondary | ICD-10-CM

## 2018-04-05 ENCOUNTER — Ambulatory Visit
Admission: RE | Admit: 2018-04-05 | Discharge: 2018-04-05 | Disposition: A | Discharge status: Home / Self Care | Payer: No Typology Code available for payment source | Source: Ambulatory Visit | Attending: Nephrology | Admitting: Nephrology

## 2018-04-05 DIAGNOSIS — N183 Chronic kidney disease, stage 3 unspecified: Secondary | ICD-10-CM

## 2018-04-10 NOTE — Patient Instructions (Signed)
It was a pleasure to see you today.  Continue follow-up with [facility].  Continue same medications.  Watch diet and try to get some exercise.  Follow-up in 6 months.

## 2018-04-16 ENCOUNTER — Other Ambulatory Visit: Payer: Self-pay | Admitting: Internal Medicine

## 2018-04-16 DIAGNOSIS — E538 Deficiency of other specified B group vitamins: Secondary | ICD-10-CM

## 2018-04-16 MED FILL — PIOGLITAZONE HCL 30 MG TAB: 30 | 30 days supply | Qty: 30 | Fill #0

## 2018-04-17 ENCOUNTER — Other Ambulatory Visit: Payer: Self-pay | Admitting: Internal Medicine

## 2018-04-17 MED FILL — buPROPion HCL ER (XL) 300 M: 300 | 90 days supply | Qty: 90 | Fill #1

## 2018-04-17 MED FILL — MONTELUKAST SOD 10 MG TAB: 10 | 90 days supply | Qty: 90 | Fill #0

## 2018-05-14 MED FILL — CYANOCOBALAMIN 1,000 MCG/ML: 1000 | 30 days supply | Qty: 1 | Fill #0 | Status: TO

## 2018-05-14 MED FILL — PIOGLITAZONE HCL 30 MG TAB: 30 | 90 days supply | Qty: 90 | Fill #0

## 2018-05-21 ENCOUNTER — Ambulatory Visit: Payer: Self-pay

## 2018-06-25 ENCOUNTER — Other Ambulatory Visit: Payer: Self-pay | Admitting: Internal Medicine

## 2018-06-25 MED FILL — PANTOPRAZOLE SOD DR 40 MG T: 40 | 30 days supply | Qty: 30 | Fill #0 | Status: TO

## 2018-06-25 MED FILL — ESCITALOPRAM 20 MG TABLET: 20 | 90 days supply | Qty: 90 | Fill #0

## 2018-06-25 MED FILL — glipiZIDE ER 10 MG TB24: 10 | 90 days supply | Qty: 90 | Fill #0

## 2018-07-08 ENCOUNTER — Ambulatory Visit: Payer: No Typology Code available for payment source

## 2018-07-22 ENCOUNTER — Other Ambulatory Visit: Payer: Self-pay | Admitting: Internal Medicine

## 2018-07-22 MED FILL — MONTELUKAST SOD 10 MG TAB: 10 | 90 days supply | Qty: 90 | Fill #1

## 2018-07-22 MED FILL — DOXYCYCLINE HYCLATE 100 MG: 100 | 90 days supply | Qty: 90 | Fill #0

## 2018-07-22 MED FILL — PANTOPRAZOLE SOD DR 40 MG T: 40 | 30 days supply | Qty: 30 | Fill #0

## 2018-07-22 MED FILL — ATORVASTATIN 20 MG TABLET: 20 | 90 days supply | Qty: 90 | Fill #0

## 2018-07-22 MED FILL — CYANOCOBALAMIN 1,000 MCG/ML: 1000 | 30 days supply | Qty: 1 | Fill #0

## 2018-07-27 ENCOUNTER — Ambulatory Visit: Payer: No Typology Code available for payment source

## 2018-08-22 MED FILL — CYANOCOBALAMIN 1,000 MCG/ML: 1000 | 90 days supply | Qty: 3 | Fill #1

## 2018-08-23 ENCOUNTER — Other Ambulatory Visit: Payer: Self-pay | Admitting: Internal Medicine

## 2018-08-23 MED FILL — PANTOPRAZOLE SOD DR 40 MG T: 40 | 90 days supply | Qty: 90 | Fill #1

## 2018-08-23 MED FILL — buPROPion HCL ER (XL) 300 M: 300 | 90 days supply | Qty: 90 | Fill #0

## 2018-08-23 MED FILL — PIOGLITAZONE HCL 30 MG TAB: 30 | 90 days supply | Qty: 90 | Fill #0

## 2018-08-26 NOTE — Telephone Encounter (Signed)
I believe she missed an appointment. Please call her before refilling

## 2018-08-26 NOTE — Telephone Encounter (Signed)
Refill x 90 days since has appt

## 2018-08-26 NOTE — Telephone Encounter (Signed)
Has 6 month on 7/17

## 2018-08-27 MED FILL — ALBUTEROL SULFATE HFA 108 (: 108 (90 BAS | 17 days supply | Qty: 18 | Fill #0

## 2018-08-27 MED FILL — SYMBICORT 160-4.5 MCG INH: 160-4.5 | 30 days supply | Qty: 10 | Fill #0

## 2018-09-02 ENCOUNTER — Other Ambulatory Visit: Payer: Self-pay

## 2018-09-02 ENCOUNTER — Encounter: Payer: Self-pay | Admitting: Internal Medicine

## 2018-09-02 ENCOUNTER — Other Ambulatory Visit: Payer: No Typology Code available for payment source | Admitting: Internal Medicine

## 2018-09-02 ENCOUNTER — Telehealth: Payer: Self-pay | Admitting: Internal Medicine

## 2018-09-02 DIAGNOSIS — I10 Essential (primary) hypertension: Secondary | ICD-10-CM

## 2018-09-02 DIAGNOSIS — N183 Chronic kidney disease, stage 3 unspecified: Secondary | ICD-10-CM

## 2018-09-02 DIAGNOSIS — E785 Hyperlipidemia, unspecified: Secondary | ICD-10-CM

## 2018-09-02 DIAGNOSIS — E119 Type 2 diabetes mellitus without complications: Secondary | ICD-10-CM

## 2018-09-02 MED FILL — glipiZIDE ER 10 MG TB24: 10 | 90 days supply | Qty: 90 | Fill #0

## 2018-09-02 MED FILL — ESCITALOPRAM 20 MG TABLET: 20 | 90 days supply | Qty: 90 | Fill #0

## 2018-09-02 NOTE — Telephone Encounter (Signed)
[facility] for fasting labs this am. Called to ask her to reschedule before 6 month recheck on Friday. Had to leave voice mail message.

## 2018-09-03 LAB — BASIC METABOLIC PANEL
BUN/Creatinine Ratio: 14 (calc) (ref 6–22)
BUN: 43 mg/dL — ABNORMAL HIGH (ref 7–25)
CO2: 29 mmol/L (ref 20–32)
Calcium: 9.2 mg/dL (ref 8.6–10.4)
Chloride: 106 mmol/L (ref 98–110)
Creat: 3.02 mg/dL — ABNORMAL HIGH (ref 0.50–1.05)
Glucose, Bld: 154 mg/dL — ABNORMAL HIGH (ref 65–99)
Potassium: 4.6 mmol/L (ref 3.5–5.3)
Sodium: 142 mmol/L (ref 135–146)

## 2018-09-03 LAB — HEPATIC FUNCTION PANEL
AG Ratio: 1.6 (calc) (ref 1.0–2.5)
ALT: 12 U/L (ref 6–29)
AST: 14 U/L (ref 10–35)
Albumin: 3.6 g/dL (ref 3.6–5.1)
Alkaline phosphatase (APISO): 87 U/L (ref 37–153)
Bilirubin, Direct: 0.1 mg/dL (ref 0.0–0.2)
Globulin: 2.2 g/dL (calc) (ref 1.9–3.7)
Indirect Bilirubin: 0.5 mg/dL (calc) (ref 0.2–1.2)
Total Bilirubin: 0.6 mg/dL (ref 0.2–1.2)
Total Protein: 5.8 g/dL — ABNORMAL LOW (ref 6.1–8.1)

## 2018-09-03 LAB — LIPID PANEL
Cholesterol: 136 mg/dL (ref <200)
HDL: 45 mg/dL — ABNORMAL LOW (ref >=50)
LDL Cholesterol (Calc): 71 mg/dL (calc)
Non-HDL Cholesterol (Calc): 91 mg/dL (calc) (ref <130)
Total CHOL/HDL Ratio: 3 (calc) (ref <5.0)
Triglycerides: 118 mg/dL (ref <150)

## 2018-09-03 LAB — MICROALBUMIN / CREATININE URINE RATIO
Creatinine, Urine: 117 mg/dL (ref 20–275)
Microalb Creat Ratio: 22 mcg/mg creat (ref <30)
Microalb, Ur: 2.6 mg/dL

## 2018-09-03 LAB — HEMOGLOBIN A1C
Hgb A1c MFr Bld: 6.6 % of total Hgb — ABNORMAL HIGH (ref <5.7)
Mean Plasma Glucose: 143 (calc)
eAG (mmol/L): 7.9 (calc)

## 2018-09-06 ENCOUNTER — Ambulatory Visit (INDEPENDENT_AMBULATORY_CARE_PROVIDER_SITE_OTHER): Payer: No Typology Code available for payment source | Admitting: Internal Medicine

## 2018-09-06 ENCOUNTER — Encounter: Payer: Self-pay | Admitting: Internal Medicine

## 2018-09-06 ENCOUNTER — Other Ambulatory Visit: Payer: Self-pay

## 2018-09-06 VITALS — BP 140/70 | HR 68 | Ht 67.0 in | Wt 221.0 lb

## 2018-09-06 DIAGNOSIS — N183 Chronic kidney disease, stage 3 unspecified: Secondary | ICD-10-CM

## 2018-09-06 DIAGNOSIS — F3170 Bipolar disorder, currently in remission, most recent episode unspecified: Secondary | ICD-10-CM

## 2018-09-06 DIAGNOSIS — Z6834 Body mass index (BMI) 34.0-34.9, adult: Secondary | ICD-10-CM

## 2018-09-06 DIAGNOSIS — I10 Essential (primary) hypertension: Secondary | ICD-10-CM

## 2018-09-06 DIAGNOSIS — Z8709 Personal history of other diseases of the respiratory system: Secondary | ICD-10-CM

## 2018-09-06 DIAGNOSIS — K219 Gastro-esophageal reflux disease without esophagitis: Secondary | ICD-10-CM

## 2018-09-06 DIAGNOSIS — E118 Type 2 diabetes mellitus with unspecified complications: Secondary | ICD-10-CM | POA: Diagnosis not present

## 2018-09-06 DIAGNOSIS — Z8669 Personal history of other diseases of the nervous system and sense organs: Secondary | ICD-10-CM

## 2018-09-09 ENCOUNTER — Other Ambulatory Visit: Payer: Self-pay | Admitting: Internal Medicine

## 2018-09-10 MED FILL — ACCU-CHEK GUIDE STRP: 50 days supply | Qty: 100 | Fill #1

## 2018-10-07 NOTE — Progress Notes (Signed)
   Subjective:    Patient ID: Samantha Hahn, female    DOB: 1960-06-24, 58 y.o.   MRN: [id number]  HPI 58 year old Female in today for 64-month recheck.  She has a history of diabetes mellitus, chronic kidney disease, essential hypertension, depression migraine headaches and insomnia.  History of GE reflux.  Followed at [facility] for chronic kidney disease.  History of asthma, sleep apnea, bipolar disorder, PVCs, acne rosacea, allergic rhinitis.  Allergy symptoms are stable.    She formerly worked as a Community education officer at [employer] and is now caring for her elderly parents in their home.  Lipid panel is stable.  Has low HDL of 45 but triglycerides, LDL and total cholesterol are normal.  Liver panel normal.  Hemoglobin A1c 6.6% and previously was 6.9% in January.  Remains on glipizide and Actos.  Review of Systems no new complaints Has had annual eye exam    Objective:   Physical Exam Blood pressure 140/70.  Pulse 68.  Weight 221 pounds.  BMI 34.61  Skin warm and dry.  Nodes none.  Neck is supple without thyromegaly.  Chest clear to auscultation.  Cardiac exam regular rate and rhythm.  Extremities without edema.       Assessment & Plan:  Obesity-BMI 34.61-needs to work on diet exercise and weight loss  Essential hypertension-stable on current regimen  Diabetes mellitus-stable on 2 drug regimen  History of depression-stable  Chronic Kidney Disease followed at [place]: Encourage diet exercise and weight loss. Continue current meds and follow up with CPE January 2021.

## 2018-10-16 ENCOUNTER — Ambulatory Visit: Payer: Self-pay

## 2018-10-19 NOTE — Patient Instructions (Signed)
Encourage diet exercise and weight loss.  Continue current medications.  Physical exam should be scheduled for January 2021

## 2019-01-24 ENCOUNTER — Telehealth: Payer: Self-pay | Admitting: Internal Medicine

## 2019-01-24 MED ORDER — GLIPIZIDE ER 10 MG PO TB24: 10.0000 mg | ORAL_TABLET | Freq: Every day | ORAL | 3 refills | Status: DC

## 2019-01-24 MED ORDER — MONTELUKAST SODIUM 10 MG PO TABS: 10.0000 mg | ORAL_TABLET | Freq: Every morning | ORAL | 3 refills | Status: DC

## 2019-01-24 MED ORDER — ATORVASTATIN CALCIUM 20 MG PO TABS: 20.0000 mg | ORAL_TABLET | Freq: Every day | ORAL | 3 refills | Status: DC

## 2019-01-24 NOTE — Telephone Encounter (Signed)
Theone called to say she is Psychologist, clinical  First of the year, however she needs her medications sent to  [facility] on [address] in [place] so she can use the [employer] card for this month.  glipiZIDE (GLIPIZIDE XL) 10 MG 24 hr tablet montelukast (SINGULAIR) 10 MG tablet atorvastatin (LIPITOR) 20 MG tablet

## 2019-02-04 ENCOUNTER — Other Ambulatory Visit: Payer: Self-pay | Admitting: Internal Medicine

## 2019-02-04 DIAGNOSIS — Z1231 Encounter for screening mammogram for malignant neoplasm of breast: Secondary | ICD-10-CM

## 2019-02-11 ENCOUNTER — Telehealth: Payer: Self-pay | Admitting: Internal Medicine

## 2019-02-11 MED ORDER — ESCITALOPRAM OXALATE 20 MG PO TABS: 20.0000 mg | ORAL_TABLET | Freq: Every day | ORAL | 1 refills | Status: DC

## 2019-02-11 MED ORDER — PIOGLITAZONE HCL 30 MG PO TABS: 30.0000 mg | ORAL_TABLET | Freq: Every day | ORAL | 1 refills | Status: DC

## 2019-02-11 MED ORDER — PANTOPRAZOLE SODIUM 40 MG PO TBEC: 40.0000 mg | DELAYED_RELEASE_TABLET | Freq: Every day | ORAL | 11 refills | Status: DC

## 2019-02-11 NOTE — Telephone Encounter (Signed)
Samantha Hahn [phone]  Changing from [place] to Samantha Hahn in [place] on [facility]  Needs refill on below medications and would like 90 day supply  pioglitazone (ACTOS) 30 MG tablet escitalopram (LEXAPRO) 20 MG tablet pantoprazole (PROTONIX) 40 MG tablet  Last OV 09/06/18 Last CPE 03/08/18 Next CPE 03/10/19

## 2019-02-17 ENCOUNTER — Telehealth: Payer: Self-pay | Admitting: Internal Medicine

## 2019-02-17 MED ORDER — ALBUTEROL SULFATE HFA 108 (90 BASE) MCG/ACT IN AERS: INHALATION_SPRAY | RESPIRATORY_TRACT | 11 refills | Status: DC

## 2019-02-17 NOTE — Telephone Encounter (Signed)
Samantha Hahn [phone]  [name] called to say she needs to use the [facility] for below medication and she would like [facility] to [facility] in [place] on [address], because they have best price.   90 day supply VENTOLIN HFA 108 (90 Base) MCG/ACT inhaler

## 2019-03-03 ENCOUNTER — Other Ambulatory Visit: Payer: 59 | Admitting: Internal Medicine

## 2019-03-03 ENCOUNTER — Other Ambulatory Visit: Payer: Self-pay

## 2019-03-03 DIAGNOSIS — Z0000 Encounter for general adult medical examination without abnormal findings: Secondary | ICD-10-CM

## 2019-03-03 DIAGNOSIS — N183 Chronic kidney disease, stage 3 unspecified: Secondary | ICD-10-CM

## 2019-03-03 DIAGNOSIS — E119 Type 2 diabetes mellitus without complications: Secondary | ICD-10-CM

## 2019-03-03 DIAGNOSIS — I10 Essential (primary) hypertension: Secondary | ICD-10-CM

## 2019-03-03 DIAGNOSIS — Z8669 Personal history of other diseases of the nervous system and sense organs: Secondary | ICD-10-CM

## 2019-03-03 DIAGNOSIS — Z1321 Encounter for screening for nutritional disorder: Secondary | ICD-10-CM

## 2019-03-03 DIAGNOSIS — F3170 Bipolar disorder, currently in remission, most recent episode unspecified: Secondary | ICD-10-CM

## 2019-03-03 DIAGNOSIS — E785 Hyperlipidemia, unspecified: Secondary | ICD-10-CM

## 2019-03-04 ENCOUNTER — Other Ambulatory Visit: Payer: Self-pay

## 2019-03-04 DIAGNOSIS — D649 Anemia, unspecified: Secondary | ICD-10-CM

## 2019-03-05 LAB — COMPLETE METABOLIC PANEL WITH GFR
AG Ratio: 1.5 (calc) (ref 1.0–2.5)
ALT: 19 U/L (ref 6–29)
AST: 30 U/L (ref 10–35)
Albumin: 3.7 g/dL (ref 3.6–5.1)
Alkaline phosphatase (APISO): 89 U/L (ref 37–153)
BUN/Creatinine Ratio: 17 (calc) (ref 6–22)
BUN: 46 mg/dL — ABNORMAL HIGH (ref 7–25)
CO2: 29 mmol/L (ref 20–32)
Calcium: 9.1 mg/dL (ref 8.6–10.4)
Chloride: 104 mmol/L (ref 98–110)
Creat: 2.77 mg/dL — ABNORMAL HIGH (ref 0.50–1.05)
GFR, Est African American: 21 mL/min/{1.73_m2} — ABNORMAL LOW (ref >=60)
GFR, Est Non African American: 18 mL/min/{1.73_m2} — ABNORMAL LOW (ref >=60)
Globulin: 2.5 g/dL (calc) (ref 1.9–3.7)
Glucose, Bld: 115 mg/dL — ABNORMAL HIGH (ref 65–99)
Potassium: 4.3 mmol/L (ref 3.5–5.3)
Sodium: 142 mmol/L (ref 135–146)
Total Bilirubin: 0.5 mg/dL (ref 0.2–1.2)
Total Protein: 6.2 g/dL (ref 6.1–8.1)

## 2019-03-05 LAB — HEMOGLOBIN A1C
Hgb A1c MFr Bld: 6.7 % of total Hgb — ABNORMAL HIGH (ref <5.7)
Mean Plasma Glucose: 146 (calc)
eAG (mmol/L): 8.1 (calc)

## 2019-03-05 LAB — CBC WITH DIFFERENTIAL/PLATELET
Absolute Monocytes: 360 cells/uL (ref 200–950)
Basophils Absolute: 18 cells/uL (ref 0–200)
Basophils Relative: 0.4 %
Eosinophils Absolute: 378 cells/uL (ref 15–500)
Eosinophils Relative: 8.4 %
HCT: 32.5 % — ABNORMAL LOW (ref 35.0–45.0)
Hemoglobin: 10.7 g/dL — ABNORMAL LOW (ref 11.7–15.5)
Lymphs Abs: 1278 cells/uL (ref 850–3900)
MCH: 30.3 pg (ref 27.0–33.0)
MCHC: 32.9 g/dL (ref 32.0–36.0)
MCV: 92.1 fL (ref 80.0–100.0)
MPV: 10.7 fL (ref 7.5–12.5)
Monocytes Relative: 8 %
Neutro Abs: 2466 cells/uL (ref 1500–7800)
Neutrophils Relative %: 54.8 %
Platelets: 156 10*3/uL (ref 140–400)
RBC: 3.53 10*6/uL — ABNORMAL LOW (ref 3.80–5.10)
RDW: 12.4 % (ref 11.0–15.0)
Total Lymphocyte: 28.4 %
WBC: 4.5 10*3/uL (ref 3.8–10.8)

## 2019-03-05 LAB — LIPID PANEL
Cholesterol: 147 mg/dL (ref <200)
HDL: 46 mg/dL — ABNORMAL LOW (ref >=50)
LDL Cholesterol (Calc): 79 mg/dL (calc)
Non-HDL Cholesterol (Calc): 101 mg/dL (calc) (ref <130)
Total CHOL/HDL Ratio: 3.2 (calc) (ref <5.0)
Triglycerides: 122 mg/dL (ref <150)

## 2019-03-05 LAB — IRON, TOTAL/TOTAL IRON BINDING CAP
%SAT: 24 % (calc) (ref 16–45)
Iron: 72 ug/dL (ref 45–160)
TIBC: 297 mcg/dL (calc) (ref 250–450)

## 2019-03-05 LAB — VITAMIN B12: Vitamin B-12: 1681 pg/mL — ABNORMAL HIGH (ref 200–1100)

## 2019-03-05 LAB — TEST AUTHORIZATION

## 2019-03-05 LAB — VITAMIN D 25 HYDROXY (VIT D DEFICIENCY, FRACTURES): Vit D, 25-Hydroxy: 36 ng/mL (ref 30–100)

## 2019-03-05 LAB — TSH: TSH: 1.13 mIU/L (ref 0.40–4.50)

## 2019-03-10 ENCOUNTER — Other Ambulatory Visit: Payer: Self-pay

## 2019-03-10 ENCOUNTER — Ambulatory Visit (INDEPENDENT_AMBULATORY_CARE_PROVIDER_SITE_OTHER): Payer: 59 | Admitting: Internal Medicine

## 2019-03-10 ENCOUNTER — Encounter: Payer: Self-pay | Admitting: Internal Medicine

## 2019-03-10 VITALS — BP 130/80 | HR 58 | Temp 98.0°F | Ht 67.0 in | Wt 232.0 lb

## 2019-03-10 DIAGNOSIS — F3170 Bipolar disorder, currently in remission, most recent episode unspecified: Secondary | ICD-10-CM | POA: Diagnosis not present

## 2019-03-10 DIAGNOSIS — Z0000 Encounter for general adult medical examination without abnormal findings: Secondary | ICD-10-CM

## 2019-03-10 DIAGNOSIS — Z8669 Personal history of other diseases of the nervous system and sense organs: Secondary | ICD-10-CM

## 2019-03-10 DIAGNOSIS — E118 Type 2 diabetes mellitus with unspecified complications: Secondary | ICD-10-CM | POA: Diagnosis not present

## 2019-03-10 DIAGNOSIS — Z6836 Body mass index (BMI) 36.0-36.9, adult: Secondary | ICD-10-CM

## 2019-03-10 DIAGNOSIS — Z23 Encounter for immunization: Secondary | ICD-10-CM | POA: Diagnosis not present

## 2019-03-10 DIAGNOSIS — N1831 Chronic kidney disease, stage 3a: Secondary | ICD-10-CM | POA: Diagnosis not present

## 2019-03-10 DIAGNOSIS — D649 Anemia, unspecified: Secondary | ICD-10-CM

## 2019-03-10 DIAGNOSIS — Z8709 Personal history of other diseases of the respiratory system: Secondary | ICD-10-CM

## 2019-03-10 DIAGNOSIS — I10 Essential (primary) hypertension: Secondary | ICD-10-CM

## 2019-03-10 LAB — POCT URINALYSIS DIPSTICK
Appearance: NEGATIVE
Bilirubin, UA: NEGATIVE
Blood, UA: NEGATIVE
Glucose, UA: NEGATIVE
Ketones, UA: NEGATIVE
Leukocytes, UA: NEGATIVE
Nitrite, UA: NEGATIVE
Odor: NEGATIVE
Protein, UA: NEGATIVE
Spec Grav, UA: 1.01 (ref 1.010–1.025)
Urobilinogen, UA: 0.2 E.U./dL
pH, UA: 6.5 (ref 5.0–8.0)

## 2019-03-10 NOTE — Patient Instructions (Addendum)
Try to take time to walk daily. Watch diet and try to lose weight. RTC in 6 months. Flu vaccine given.  Take iron supplement and follow-up in 3 months.

## 2019-03-10 NOTE — Progress Notes (Signed)
Subjective:    Patient ID: Samantha Hahn, female    DOB: 25-Jun-1960, 58 y.o.   MRN: [id number]  HPI 59 year old Female seen for health maintenance exam and evaluation of medical issues.  History of chronic kidney disease seen by Dr. Lazarus Gowda spur at [facility]..  History of diabetes mellitus, essential hypertension, migraine headaches, asthma, depression, insomnia, allergic rhinitis, acne rosacea, sleep apnea bipolar disorder and PVCs.  Status post vaginal hysterectomy in 2006.  After that she had consultation with gastroenterologist for diarrhea nausea weight loss and abdominal bloating.  Symptoms seem to start after surgery.  She was treated with Nexium and a 5-day course of Flagyl with improvement.  History of allergic rhinitis with positive skin test to grasses, weeds, trees, dog and house dust mite.  Testing was done by Dr. Neldon Mc in 2001.  She also had spirometry showing small airways disease.  Following albuterol treatment, FEV1 did not change significantly.  Has been diagnosed with asthma.  History of GE reflux with a component of reflux induced respiratory disease.  In 2011 she fell at home and had a brief loss of consciousness.  Was diagnosed with minor head injury.  CT of the spine and hand were normal.  Sees GYN physician.  Had colonoscopy in 2013 and had an adenomatous polyp.  Colonoscopy was done by Dr. Henrene Pastor.  In April 2019 Dr. Henrene Pastor did endoscopy showing multiple small erosions in the gastric antrum.  In May 2019 had colonoscopy due to weight loss and had an adenomatous polyp in the ascending colon.  In November 2018 was found to be anemic with hemoglobin 9.3 g with iron level of 53.  This resolved with iron therapy.  In 2013 she had adhesive capsulitis of the left shoulder and had left shoulder surgery by Dr. Onnie Graham.  Social history: She is divorced.  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.  She has 1 adult daughter.  Does not smoke or  consume alcohol.  She is now caring for her elderly parents in their home which is stressful.  However she says it is less stressful now that she is living with them.  Family history: Brother with history of hypertrophic cardiomyopathy.  1 sister in good health.  Mother with history of stroke, heart disease, kidney stones as well as diabetes.  Father with history of diabetes.    Review of Systems history of migraine headaches     Objective:   Physical Exam Weight 232 pounds BMI 36.34 temperature 98 degrees orally pulse oximetry 96% pulse 58 blood pressure stable 130/80  Skin warm and dry.  Nodes none.  Neck is supple without thyromegaly or JVD.  No carotid bruits.  Chest clear to auscultation.  Breasts normal female without masses.  Cardiac exam regular rate and rhythm normal S1 and S2 without murmurs or gallops.  Abdomen obese soft nondistended without hepatosplenomegaly masses or tenderness.  No lower extremity pitting edema.  Neuro intact without gross focal deficits.  Affect thought and judgment are normal.     Assessment & Plan:  Obesity-needs to be serious about diet exercise and weight loss.  Admits she has not been watching carefully during the pandemic.  BMI 36  Type 2 diabetes mellitus-Hemoglobin A1c 6.7% and previously was 6.6% in July and was 6.9% in January 2020.  History of migraine headaches  Normocytic anemia which she had previously in 2018.  Recommend iron supplement and follow-up in 3 months.  Her iron level  recently was normal and her B12 level was actually on the high side.  Folate was not checked.  I am not sure why she is anemic.  She has had this previously and responded to iron therapy.  History of bipolar disorder  Allergic rhinitis  Chronic kidney disease-recent creatinine was 2.77 and has been as high as 3.69 in January 2020.  Continue follow-up with nephrology.  Due to chronic kidney disease it is imperative that she take better care of herself with regard to  glucose control and obesity  Low HDL of 46  History of adenomatous colon polyp  History of sleep apnea  Essential hypertension-stable  GE reflux treated with PPI  Plan: I have encouraged diet exercise and weight loss.  Exercise is imperative although she has elderly parents to look after she must take care of herself as well.  Continue current medication regimen.  Try iron supplement and follow-up with anemia in 3 months.  Otherwise follow-up in 6 months.

## 2019-03-24 ENCOUNTER — Ambulatory Visit
Admission: RE | Admit: 2019-03-24 | Discharge: 2019-03-24 | Disposition: A | Discharge status: Home / Self Care | Payer: Self-pay | Source: Ambulatory Visit | Attending: Internal Medicine | Admitting: Internal Medicine

## 2019-03-24 ENCOUNTER — Other Ambulatory Visit: Payer: Self-pay

## 2019-03-24 DIAGNOSIS — Z1231 Encounter for screening mammogram for malignant neoplasm of breast: Secondary | ICD-10-CM

## 2019-03-27 ENCOUNTER — Other Ambulatory Visit: Payer: Self-pay | Admitting: Internal Medicine

## 2019-03-27 MED ORDER — BUPROPION HCL ER (XL) 300 MG PO TB24: 300.0000 mg | ORAL_TABLET | Freq: Every day | ORAL | 0 refills | Status: DC

## 2019-03-27 NOTE — Telephone Encounter (Signed)
Udell Blasingame [id number]  buPROPion (WELLBUTRIN XL) 300 MG 24 hr tablet   [address] [place], [place] Phone:  [phone]  Fax:  [phone]     Meghann called to see if she could get a refill sent into the above pharmacy, she is using the [facility]

## 2019-04-13 ENCOUNTER — Encounter: Payer: Self-pay | Admitting: Internal Medicine

## 2019-05-01 ENCOUNTER — Telehealth: Payer: Self-pay | Admitting: Internal Medicine

## 2019-05-01 NOTE — Telephone Encounter (Signed)
New Rx written for one year

## 2019-05-01 NOTE — Telephone Encounter (Signed)
Samantha Hahn [phone]  [name] called to say she need below prescription sent to pharmacy.   ACCU-CHEK GUIDE test strip  [place], [place] Phone:  [phone]  Fax:  [phone]

## 2019-05-22 ENCOUNTER — Telehealth: Payer: Self-pay | Admitting: Internal Medicine

## 2019-05-22 DIAGNOSIS — E538 Deficiency of other specified B group vitamins: Secondary | ICD-10-CM

## 2019-05-22 MED ORDER — CYANOCOBALAMIN 1000 MCG/ML IJ SOLN: INTRAMUSCULAR | 5 refills | Status: DC

## 2019-05-22 NOTE — Telephone Encounter (Signed)
Samantha Hahn [phone]  [name] called to say she needs a prescription for medication below called in to the [facility] in [place] since she has retired, she can get best price there with the [facility]  cyanocobalamin (,VITAMIN B-12,) 1000 MCG/ML injection

## 2019-05-22 NOTE — Telephone Encounter (Signed)
Please send this in 

## 2019-08-20 ENCOUNTER — Other Ambulatory Visit: Payer: Self-pay | Admitting: Internal Medicine

## 2019-08-21 IMAGING — US US RENAL
1 series · 14 of 25 positions shown · non-contrast
Comparison: Prior CT from 06/29/2017

CLINICAL DATA: Initial evaluation for chronic kidney disease, stage
III

EXAM:
RENAL / URINARY TRACT ULTRASOUND COMPLETE

[Series 1: us renal · 0.22mm/px · 14 of 50 slices shown]
[im 1/50]
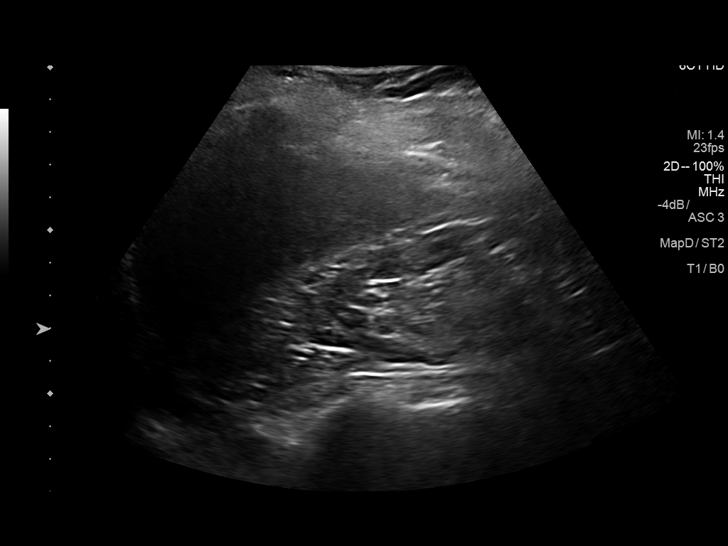
[im 5/50]
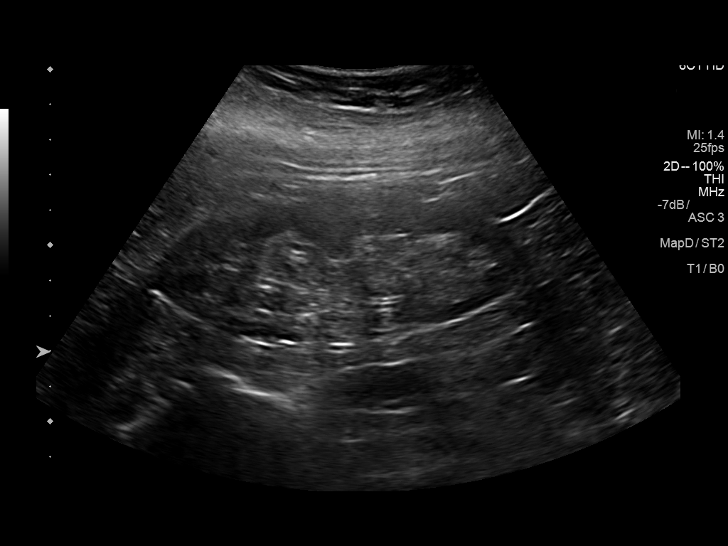
[im 9/50]
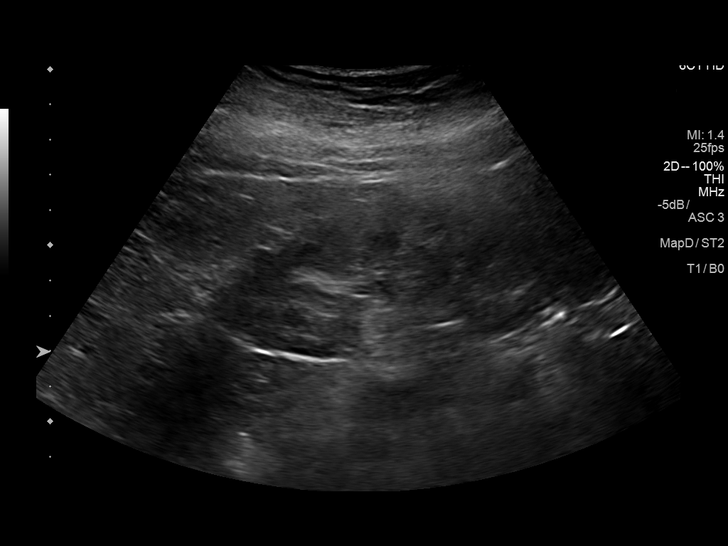
[im 13/50]
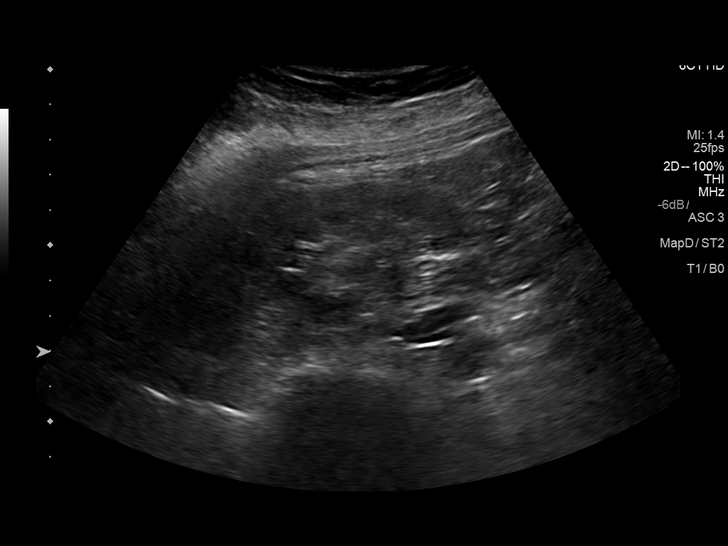
[im 17/50]
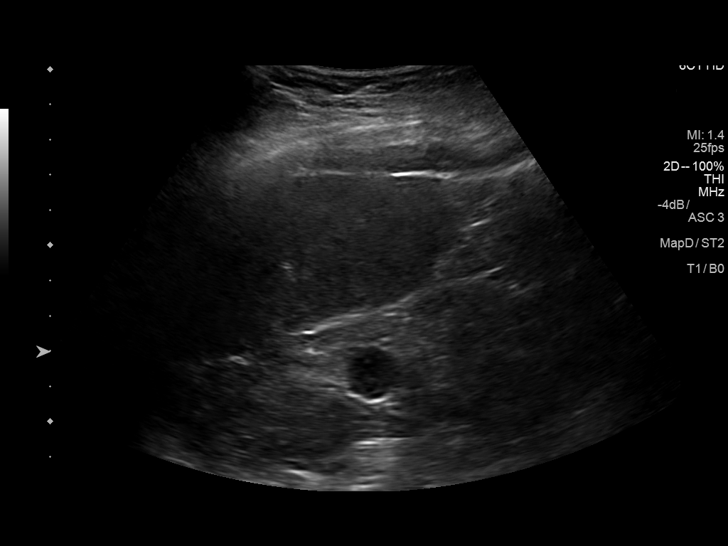
[im 19/50]
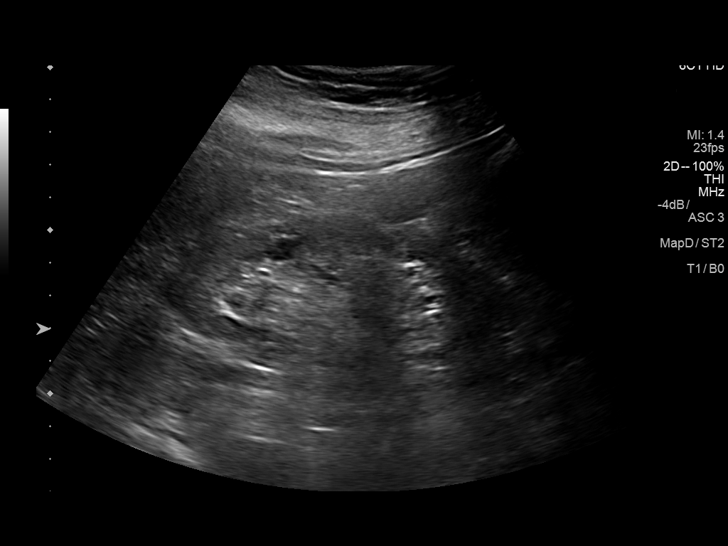
[im 23/50]
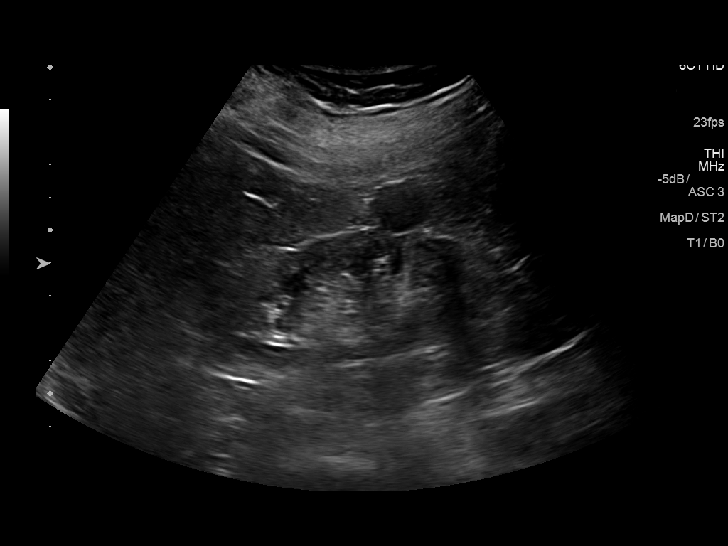
[im 27/50]
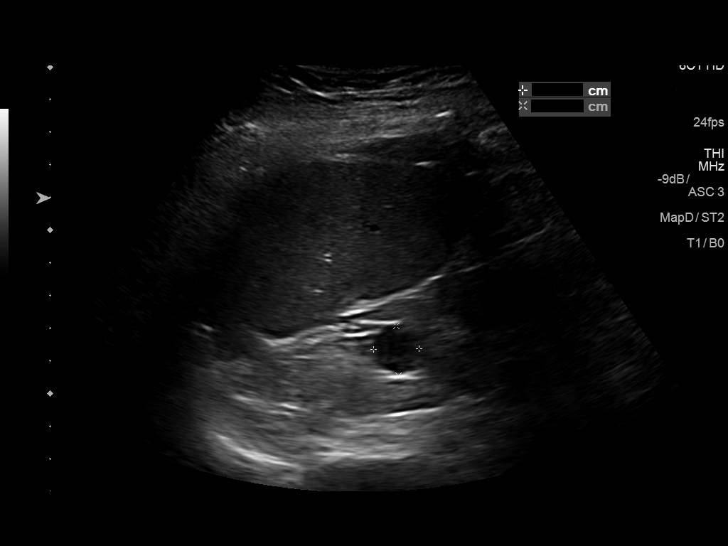
[im 31/50]
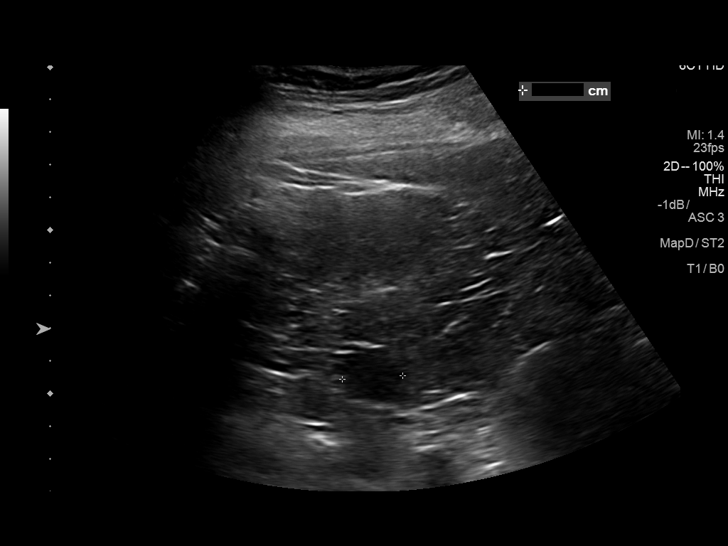
[im 33/50]
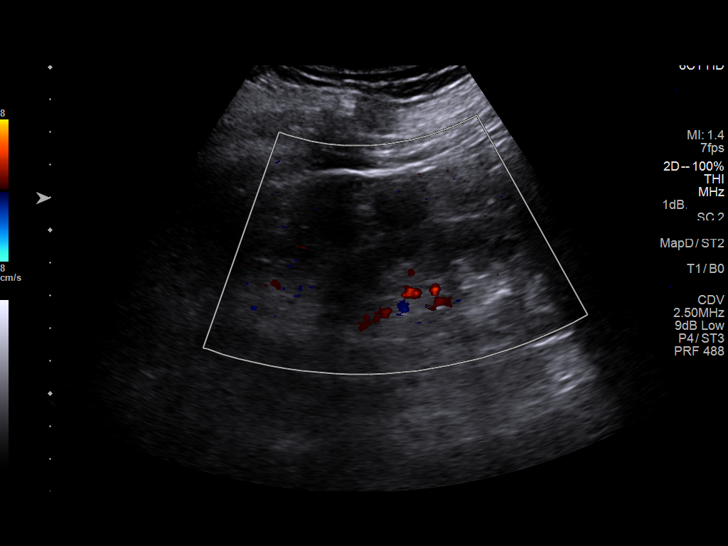
[im 37/50]
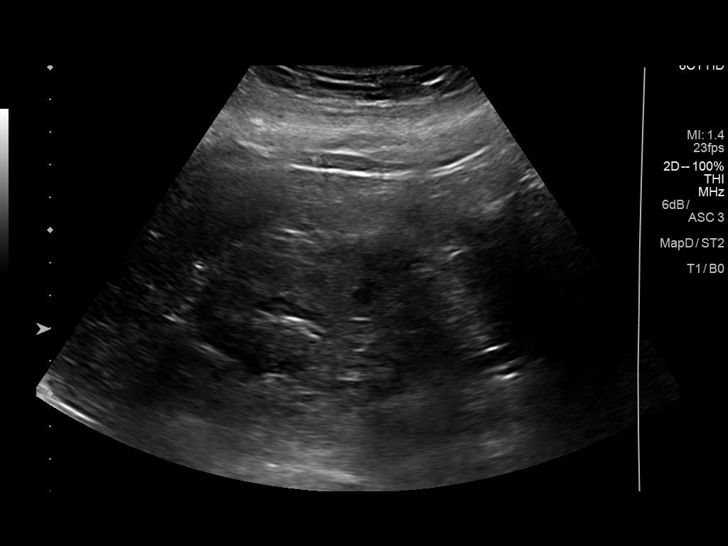
[im 41/50]
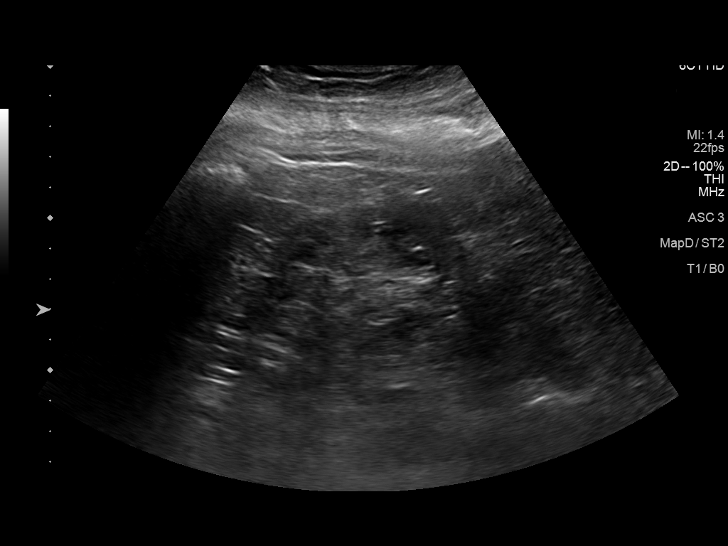
[im 45/50]
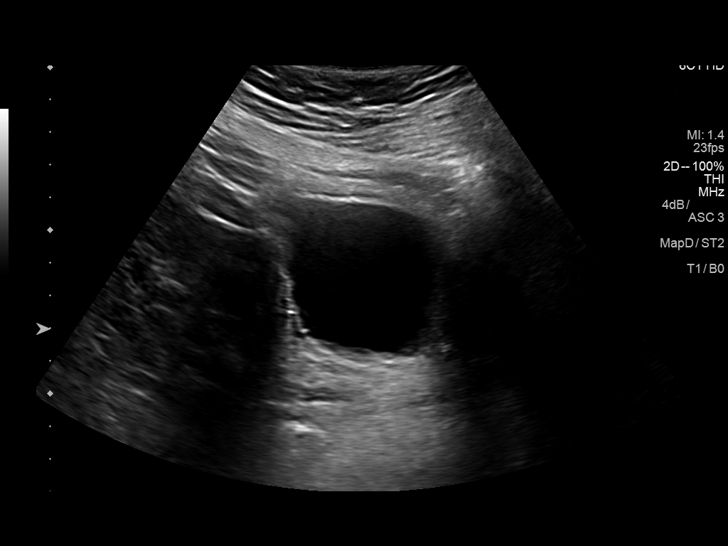
[im 50/50]
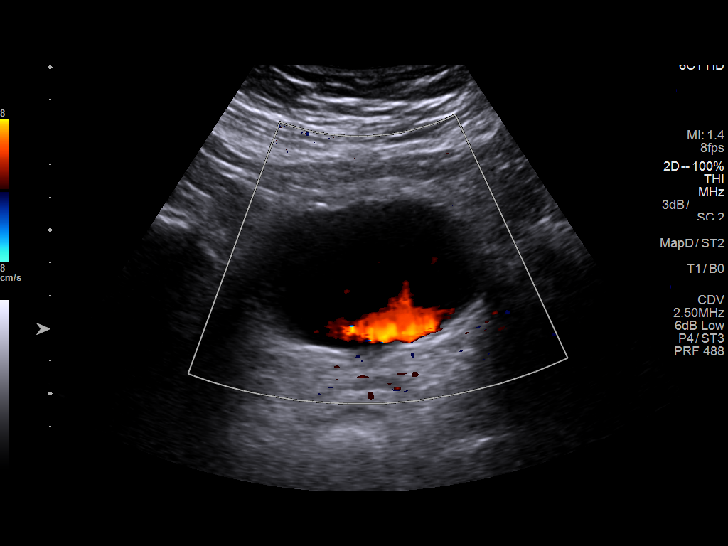

[14 of 25 positions shown; findings below may reference images not displayed]

FINDINGS: Right Kidney:

Renal measurements: 10.6 x 4.4 x 3.6 cm = volume: 87.3 mL. Diffusely
increased echogenicity within the renal parenchyma with diffuse
cortical thinning, compatible with chronic medical renal disease. No
focal mass. No hydronephrosis.

Left Kidney:

Renal measurements: 10.9 x 4.2 x 2.4 cm = volume: 120.6 mL.
Diffusely increased echogenicity within the renal parenchyma with
cortical thinning, compatible with medical renal disease. No
hydronephrosis. 2.0 x 1.6 x 1.9 cm simple exophytic cyst present at
the interpolar left kidney. Additional 1.4 x 1.5 x 1.5 cm simple
cyst present at the upper pole. 1.9 x 1.6 x 1.6 cm simple cyst
present at the lower pole.

Bladder:

Appears normal for degree of bladder distention. Bilateral ureteral
jets visualized.
IMPRESSION: 1. Increased echogenicity within the renal parenchyma with diffuse
cortical thinning, compatible with chronic medical renal disease. No
hydronephrosis.
2. Simple left renal cysts as detailed above.

## 2019-09-05 ENCOUNTER — Encounter: Payer: Self-pay | Admitting: Internal Medicine

## 2019-09-09 ENCOUNTER — Other Ambulatory Visit: Payer: 59 | Admitting: Internal Medicine

## 2019-09-09 ENCOUNTER — Other Ambulatory Visit: Payer: Self-pay

## 2019-09-09 DIAGNOSIS — E785 Hyperlipidemia, unspecified: Secondary | ICD-10-CM

## 2019-09-09 DIAGNOSIS — E119 Type 2 diabetes mellitus without complications: Secondary | ICD-10-CM

## 2019-09-10 LAB — HEPATIC FUNCTION PANEL
AG Ratio: 1.5 (calc) (ref 1.0–2.5)
ALT: 10 U/L (ref 6–29)
AST: 13 U/L (ref 10–35)
Albumin: 3.7 g/dL (ref 3.6–5.1)
Alkaline phosphatase (APISO): 100 U/L (ref 37–153)
Bilirubin, Direct: 0.1 mg/dL (ref 0.0–0.2)
Globulin: 2.4 g/dL (calc) (ref 1.9–3.7)
Indirect Bilirubin: 0.6 mg/dL (calc) (ref 0.2–1.2)
Total Bilirubin: 0.7 mg/dL (ref 0.2–1.2)
Total Protein: 6.1 g/dL (ref 6.1–8.1)

## 2019-09-10 LAB — LIPID PANEL
Cholesterol: 143 mg/dL (ref <200)
HDL: 44 mg/dL — ABNORMAL LOW (ref >=50)
LDL Cholesterol (Calc): 76 mg/dL (calc)
Non-HDL Cholesterol (Calc): 99 mg/dL (calc) (ref <130)
Total CHOL/HDL Ratio: 3.3 (calc) (ref <5.0)
Triglycerides: 144 mg/dL (ref <150)

## 2019-09-10 LAB — HEMOGLOBIN A1C
Hgb A1c MFr Bld: 7.7 % of total Hgb — ABNORMAL HIGH (ref <5.7)
Mean Plasma Glucose: 174 (calc)
eAG (mmol/L): 9.7 (calc)

## 2019-09-11 ENCOUNTER — Encounter: Payer: Self-pay | Admitting: Internal Medicine

## 2019-09-11 ENCOUNTER — Ambulatory Visit (INDEPENDENT_AMBULATORY_CARE_PROVIDER_SITE_OTHER): Payer: 59 | Admitting: Internal Medicine

## 2019-09-11 ENCOUNTER — Other Ambulatory Visit: Payer: Self-pay

## 2019-09-11 VITALS — BP 120/80 | HR 75 | Ht 67.0 in | Wt 215.0 lb

## 2019-09-11 DIAGNOSIS — F3170 Bipolar disorder, currently in remission, most recent episode unspecified: Secondary | ICD-10-CM | POA: Diagnosis not present

## 2019-09-11 DIAGNOSIS — I10 Essential (primary) hypertension: Secondary | ICD-10-CM

## 2019-09-11 DIAGNOSIS — N189 Chronic kidney disease, unspecified: Secondary | ICD-10-CM | POA: Diagnosis not present

## 2019-09-11 DIAGNOSIS — E119 Type 2 diabetes mellitus without complications: Secondary | ICD-10-CM | POA: Diagnosis not present

## 2019-09-11 DIAGNOSIS — Z8709 Personal history of other diseases of the respiratory system: Secondary | ICD-10-CM

## 2019-09-11 DIAGNOSIS — Z8669 Personal history of other diseases of the nervous system and sense organs: Secondary | ICD-10-CM

## 2019-09-11 NOTE — Progress Notes (Signed)
   Subjective:    Patient ID: Samantha Hahn, female    DOB: [birth year], 59 y.o.   MRN: [id number]  HPI 59 year old Female for 6 month recheck.  Patient has a history of chronic kidney disease followed by Dr. Moshe Cipro at [facility].  Patient says she has been put on a transplant list at [facility].  Dr. Shelva Majestic records indicate patient has stage III chronic kidney disease secondary to chronic medical illnesses as well as NSAID use in the past.  Initially creatinine was 1.49 in 2014, in 2015 it was 1.29 and in May 2016 it was 1.46.  Was referred to nephrologist at that time.  Had no proteinuria and renal ultrasound showed kidney sizes between 11.5 and 12 cm bilaterally.  SPEP was negative.  No anemia or secondary hyperparathyroidism.  Creatinine in December 2017 was 1.73 and in May 2018 was 1.58.  In July 2018 creatinine was 2.25.  In January 2019 it was 3.36 and was 3.69 on January 2020.  Metoprolol was stopped but creatinine did not improve significantly in all.  She saw nephrologist in June.  Has developed lower extremity edema and is using Lasix on an as-needed basis.  Continues to live with parents.  Mother has dementia and sometimes keeps her up at night which she did last night.  Very little sleep last night at all.  Recent labs here show hemoglobin A1c of 7.7%.  Liver functions are normal.  Lipid panel was normal with the exception of a low HDL of 44.  Her blood pressure is excellent at 120/80.   Colonoscopy due 20204. Review of Systems- reminded about diabetic eye exam     Objective:   Physical Exam Weight was 232 in January and is now 215 pounds.  BMI 33.67.  Blood pressure 120/80, pulse 75 and regular.  Skin warm and dry.  She has 1+ pitting edema of the lower extremities.  Neck is supple without JVD thyromegaly or carotid bruits.  Chest clear to auscultation.  Cardiac exam regular rate and rhythm normal S1 and S2.         Assessment & Plan:    Essential hypertension-stable BP-only takes prn Lasix  Diabetes mellitus-would like control to be a bit better.  She is on glipizide XL 10 mg daily, Actos 30 mg daily.  Nephrologist took her off of Metformin.  Am referring her to endocrinology to assist with management  Anxiety depression treated with Wellbutrin and Lexapro  GE reflux treated with Protonix  Hyperlipidemia treated with Lipitor generic  Reactive airways disease treated with Ventolin and Symbicort  Chronic kidney disease stage III-patient being followed by nephrologist and has been placed on transplant list at [facility].  Plan: Patient will return here in 6 months for health maintenance exam.  Will be referred to endocrinology for assistance in diabetic management.  Continue close follow-up with Nephrology.  30 minutes spent reviewing records, seeing patient and making medical decisions

## 2019-09-11 NOTE — Patient Instructions (Signed)
Referral to Endocrinology for diabetic management.  Continue close follow-up with nephrology.  Continue lipid-lowering medication.  Watch diet and try to exercise.  Follow-up here in 6 months.

## 2019-09-12 LAB — MICROALBUMIN / CREATININE URINE RATIO
Creatinine, Urine: 49 mg/dL (ref 20–275)
Microalb Creat Ratio: 12 mcg/mg creat (ref <30)
Microalb, Ur: 0.6 mg/dL

## 2019-09-30 ENCOUNTER — Other Ambulatory Visit: Payer: Self-pay

## 2019-10-15 ENCOUNTER — Telehealth: Payer: Self-pay | Admitting: Internal Medicine

## 2019-10-15 NOTE — Telephone Encounter (Signed)
Faxed Urgent request for Medical Records to [facility] fax [phone], phone [phone]  Patient is currently hospitalized and scheduled for Pre Kidney Transplant

## 2019-12-01 ENCOUNTER — Ambulatory Visit: Payer: 59 | Admitting: Endocrinology

## 2019-12-01 ENCOUNTER — Other Ambulatory Visit: Payer: Self-pay

## 2019-12-01 ENCOUNTER — Encounter: Payer: Self-pay | Admitting: Endocrinology

## 2019-12-01 VITALS — BP 126/80 | HR 97 | Ht 67.0 in | Wt 225.8 lb

## 2019-12-01 DIAGNOSIS — E119 Type 2 diabetes mellitus without complications: Secondary | ICD-10-CM

## 2019-12-01 DIAGNOSIS — Z23 Encounter for immunization: Secondary | ICD-10-CM | POA: Diagnosis not present

## 2019-12-01 LAB — POCT GLYCOSYLATED HEMOGLOBIN (HGB A1C): Hemoglobin A1C: 7.6 % — AB (ref 4.0–5.6)

## 2019-12-01 MED ORDER — GLIPIZIDE ER 2.5 MG PO TB24: 2.5000 mg | ORAL_TABLET | ORAL | 5 refills | Status: DC

## 2019-12-01 MED ORDER — RYBELSUS 3 MG PO TABS: 3.0000 mg | ORAL_TABLET | Freq: Every day | ORAL | 11 refills | Status: DC

## 2019-12-01 NOTE — Progress Notes (Signed)
Subjective:    Patient ID: Samantha Hahn, female    DOB: 21-Jan-1961, 59 y.o.   MRN: [id number]  HPI pt is referred by Dr Renold Genta, for diabetes.  Pt states DM was dx'ed in 5573; it is complicated by stage 4 CRI; she has never been on insulin; pt says her diet and exercise are not good; she has never had GDM, pancreatitis, pancreatic surgery, severe hypoglycemia or DKA.  She says cbg's vary from 100-207.  She did not tolerate Byetta (inject site rash).   Past Medical History:  Diagnosis Date  . Abnormal vaginal Pap smear   . Acne rosacea   . Allergy   . Anemia   . Anxiety   . Asthma with COPD with status asthmaticus ([place])   . Depression    resolved  . Diabetes mellitus   . Dysrhythmia    pvc  . GERD (gastroesophageal reflux disease)   . Hot flashes   . Hyperlipidemia    preventative meds per pt   . Hypertension     resolved,     dr hochrein  . Migraines   . PVC (premature ventricular contraction)   . Sleep apnea    workup still in process    Past Surgical History:  Procedure Laterality Date  . ABDOMINAL HYSTERECTOMY    . COLONOSCOPY    . POLYPECTOMY    . SHOULDER ARTHROSCOPY  06/08/2011   Procedure: ARTHROSCOPY SHOULDER;  Surgeon: Marin Shutter, MD;  Location: [facility];  Service: Orthopedics;  Laterality: Left;  MUA, LOA, SAD, DCR    Social History   Socioeconomic History  . Marital status: Divorced    Spouse name: Not on file  . Number of children: Not on file  . Years of education: Not on file  . Highest education level: Not on file  Occupational History  . Occupation: PT    Employer: [employer]  Tobacco Use  . Smoking status: Never Smoker  . Smokeless tobacco: Never Used  Vaping Use  . Vaping Use: Never used  Substance and Sexual Activity  . Alcohol use: Yes    Comment: rare - 1-2 a year   . Drug use: No  . Sexual activity: Not Currently    Birth control/protection: Surgical  Other Topics Concern  . Not on file  Social History Narrative  . Not on file     Social Determinants of Health   Financial Resource Strain:   . Difficulty of Paying Living Expenses: Not on file  Food Insecurity:   . Worried About Charity fundraiser in the Last Year: Not on file  . Ran Out of Food in the Last Year: Not on file  Transportation Needs:   . Lack of Transportation (Medical): Not on file  . Lack of Transportation (Non-Medical): Not on file  Physical Activity:   . Days of Exercise per Week: Not on file  . Minutes of Exercise per Session: Not on file  Stress:   . Feeling of Stress : Not on file  Social Connections:   . Frequency of Communication with Friends and Family: Not on file  . Frequency of Social Gatherings with Friends and Family: Not on file  . Attends Religious Services: Not on file  . Active Member of Clubs or Organizations: Not on file  . Attends Archivist Meetings: Not on file  . Marital Status: Not on file  Intimate Partner Violence:   . Fear of Current or Ex-Partner: Not on file  .  Emotionally Abused: Not on file  . Physically Abused: Not on file  . Sexually Abused: Not on file    Current Outpatient Medications on File Prior to Visit  Medication Sig Dispense Refill  . ACCU-CHEK FASTCLIX LANCETS MISC USE TO TEST TWICE A DAY AS NEEDED 200 each 12  . ACCU-CHEK GUIDE test strip USE TO CHECK BLOOD SUGAR 2 TIMES A DAY AS NEEDED 100 each prn  . albuterol (VENTOLIN HFA) 108 (90 Base) MCG/ACT inhaler INHALE 2 PUFFS BY MOUTH EVERY 4-6 HOURS AS NEEDED FOR COUGH OR WHEEZE 18 g 11  . aspirin 325 MG tablet Take 325 mg by mouth daily.    Marland Kitchen atorvastatin (LIPITOR) 20 MG tablet Take 1 tablet (20 mg total) by mouth daily. 90 tablet 3  . buPROPion (WELLBUTRIN XL) 300 MG 24 hr tablet TAKE ONE TABLET BY MOUTH DAILY 90 tablet 0  . Calcium-Vitamin D 600-200 MG-UNIT per tablet Take 1 tablet by mouth daily.    . cholecalciferol (VITAMIN D) 1000 units tablet Take 1,000 Units by mouth daily.    . cyanocobalamin (,VITAMIN B-12,) 1000 MCG/ML  injection INJECT 1 ML (1,000 MCG TOTAL) INTO THE MUSCLE EVERY 30 DAYS 1 mL 5  . escitalopram (LEXAPRO) 20 MG tablet Take 1 tablet (20 mg total) by mouth daily. 90 tablet 1  . ferrous fumarate (HEMOCYTE - 106 MG FE) 325 (106 FE) MG TABS Take 1 tablet by mouth daily.    Marland Kitchen loratadine (CLARITIN) 10 MG tablet Take 10 mg by mouth daily after breakfast. For allergies    . montelukast (SINGULAIR) 10 MG tablet Take 1 tablet (10 mg total) by mouth every morning. 90 tablet 3  . multivitamin (THERAGRAN) per tablet Take 1 tablet by mouth daily.      . pantoprazole (PROTONIX) 40 MG tablet Take 1 tablet (40 mg total) by mouth daily. 30 tablet 11  . pioglitazone (ACTOS) 30 MG tablet Take 1 tablet (30 mg total) by mouth daily. 90 tablet 1  . SYMBICORT 160-4.5 MCG/ACT inhaler INHALE 2 PUFFS INTO THE LUNGS 2 TIMES DAILY. 10.2 g 5   No current facility-administered medications on file prior to visit.    Allergies  Allergen Reactions  . Clarithromycin Hives  . Vicodin [Hydrocodone-Acetaminophen] Nausea And Vomiting  . Exenatide Other (See Comments)    Rash & itching at injection site    Family History  Problem Relation Age of Onset  . Heart disease Mother 43       CAD  . Asthma Mother   . Dementia Mother   . Stroke Mother   . Breast cancer Mother   . Diabetes Mother   . Asthma Sister   . Heart Problems Father   . Diabetes Father   . Breast cancer Maternal Grandmother   . Colon cancer Neg Hx   . Esophageal cancer Neg Hx   . Rectal cancer Neg Hx   . Stomach cancer Neg Hx   . Colon polyps Neg Hx     BP 126/80 (BP Location: Left Arm, Patient Position: Sitting, Cuff Size: Small)   Pulse 97   Ht 5\' 7"  (1.702 m)   Wt 225 lb 12.8 oz (102.4 kg)   SpO2 92%   BMI 35.37 kg/m   Review of Systems denies blurry vision, chest pain, sob, n/v, urinary frequency, and memory loss.  She has recently lost weight--intentional.  No change in chronic depression.      Objective:   Physical Exam VITAL SIGNS:   See vs  page.   GENERAL: no distress.  Pulses: dorsalis pedis intact bilat.   MSK: no deformity of the feet.   CV: trace bilat leg edema, and bilat vv's.   Skin:  no ulcer on the feet.  normal color and temp on the feet. Neuro: sensation is intact to touch on the feet.    Lab Results  Component Value Date   HGBA1C 7.6 (A) 12/01/2019   Lab Results  Component Value Date   CREATININE 2.77 (H) 03/03/2019   BUN 46 (H) 03/03/2019   NA 142 03/03/2019   K 4.3 03/03/2019   CL 104 03/03/2019   CO2 29 03/03/2019   I have reviewed outside records, and summarized: Pt was noted to have elevated A1c, and referred here.  Anxiety, HTN, and dyslipidemia were also addressed.       Assessment & Plan:  Type 2 DM: uncontrolled stage 4 CRI: we should phase out glipizide  Patient Instructions  good diet and exercise significantly improve the control of your diabetes.  please let me know if you wish to be referred to a dietician.  high blood sugar is very risky to your health.  you should see an eye doctor and dentist every year.  It is very important to get all recommended vaccinations.  Controlling your blood pressure and cholesterol drastically reduces the damage diabetes does to your body.  Those who smoke should quit.  Please discuss these with your doctor.  check your blood sugar once a day.  vary the time of day when you check, between before the 3 meals, and at bedtime.  also check if you have symptoms of your blood sugar being too high or too low.  please keep a record of the readings and bring it to your next appointment here (or you can bring the meter itself).  You can write it on any piece of paper.  please call us sooner if your blood sugar goes below 70, or if you have a lot of readings over 200. We will need to take this complex situation in stages. For now, please: Reduce the glipizide to 2.5 mg daily, and: I have sent a prescription to your pharmacy, to add "Rybelsus," and: Please  continue the same pioglitazone. Please come back for a follow-up appointment in 6 weeks.

## 2019-12-01 NOTE — Patient Instructions (Signed)
good diet and exercise significantly improve the control of your diabetes.  please let me know if you wish to be referred to a dietician.  high blood sugar is very risky to your health.  you should see an eye doctor and dentist every year.  It is very important to get all recommended vaccinations.  Controlling your blood pressure and cholesterol drastically reduces the damage diabetes does to your body.  Those who smoke should quit.  Please discuss these with your doctor.  check your blood sugar once a day.  vary the time of day when you check, between before the 3 meals, and at bedtime.  also check if you have symptoms of your blood sugar being too high or too low.  please keep a record of the readings and bring it to your next appointment here (or you can bring the meter itself).  You can write it on any piece of paper.  please call us sooner if your blood sugar goes below 70, or if you have a lot of readings over 200. We will need to take this complex situation in stages. For now, please: Reduce the glipizide to 2.5 mg daily, and: I have sent a prescription to your pharmacy, to add "Rybelsus," and: Please continue the same pioglitazone. Please come back for a follow-up appointment in 6 weeks.

## 2019-12-05 ENCOUNTER — Other Ambulatory Visit: Payer: Self-pay | Admitting: Internal Medicine

## 2019-12-05 MED ORDER — PIOGLITAZONE HCL 30 MG PO TABS: 30.0000 mg | ORAL_TABLET | Freq: Every day | ORAL | 1 refills | Status: DC

## 2019-12-05 MED ORDER — ESCITALOPRAM OXALATE 20 MG PO TABS: 20.0000 mg | ORAL_TABLET | Freq: Every day | ORAL | 1 refills | Status: DC

## 2019-12-05 NOTE — Addendum Note (Signed)
Addended by: Mady Haagensen on: 12/05/2019 05:19 PM   Modules accepted: Orders

## 2019-12-15 ENCOUNTER — Other Ambulatory Visit: Payer: Self-pay

## 2019-12-15 MED ORDER — ALBUTEROL SULFATE HFA 108 (90 BASE) MCG/ACT IN AERS: INHALATION_SPRAY | RESPIRATORY_TRACT | 11 refills | Status: DC

## 2019-12-15 MED FILL — ALBUTEROL SULFATE HFA 108 (: 108 (90 BAS | 16 days supply | Qty: 18 | Fill #0

## 2019-12-25 ENCOUNTER — Other Ambulatory Visit: Payer: Self-pay | Admitting: Internal Medicine

## 2020-01-12 ENCOUNTER — Telehealth: Payer: Self-pay | Admitting: Internal Medicine

## 2020-01-12 ENCOUNTER — Ambulatory Visit: Payer: 59 | Admitting: Endocrinology

## 2020-01-12 NOTE — Telephone Encounter (Signed)
Our records indicate she has had a vaginal hysterectomy and that her GYN was [facility]. Which pneumonia vaccine is being recommended?

## 2020-01-12 NOTE — Telephone Encounter (Signed)
Samantha Hahn [phone]  Samantha Hahn would like to come in as soon as possible and get a Pap  And PNA shot, she is in the process of being evaluated for kidney transplant and these are on the list of what has to be done. Her CPE is schedule for 03/11/2020, however she would like to get this done before then.

## 2020-01-12 NOTE — Telephone Encounter (Signed)
Called and gave patient the name of her GYN and she is going to call her. She will also check with [facility] about which PNA shot she needs.

## 2020-01-20 ENCOUNTER — Other Ambulatory Visit: Payer: Self-pay | Admitting: Internal Medicine

## 2020-01-20 DIAGNOSIS — Z1231 Encounter for screening mammogram for malignant neoplasm of breast: Secondary | ICD-10-CM

## 2020-01-20 NOTE — Telephone Encounter (Signed)
Can be seen for Pap and bimanual exam next week

## 2020-01-20 NOTE — Telephone Encounter (Signed)
Pt called back and said the only reason she had saw Hester Mates Teague in the past was just for headaches and said she doesn't have a GYN and wanted to know if Dr Renold Genta could do her PAP

## 2020-01-20 NOTE — Telephone Encounter (Signed)
Scheduled

## 2020-01-26 ENCOUNTER — Ambulatory Visit (INDEPENDENT_AMBULATORY_CARE_PROVIDER_SITE_OTHER): Payer: 59 | Admitting: Internal Medicine

## 2020-01-26 ENCOUNTER — Other Ambulatory Visit (HOSPITAL_COMMUNITY)
Admission: RE | Admit: 2020-01-26 | Discharge: 2020-01-26 | Disposition: A | Discharge status: Home / Self Care | Payer: 59 | Source: Ambulatory Visit | Attending: Internal Medicine | Admitting: Internal Medicine

## 2020-01-26 ENCOUNTER — Other Ambulatory Visit: Payer: Self-pay

## 2020-01-26 VITALS — BP 140/80 | HR 72 | Ht 67.0 in | Wt 226.0 lb

## 2020-01-26 DIAGNOSIS — Z1272 Encounter for screening for malignant neoplasm of vagina: Secondary | ICD-10-CM

## 2020-01-26 NOTE — Progress Notes (Signed)
   Subjective:    Patient ID: Samantha Hahn, female    DOB: April 30, 1960, 59 y.o.   MRN: [id number]  HPI 59 year old Female seen for Pap smear and pelvic exam as needed to be considered for renal transplant.  Her CPE is due in January but this was needed urgently to be on renal transplant list.  Apparently has not seen GYN physician in some time.  She is status post vaginal hysterectomy in 2006.  Did not have oophorectomy.  This was done by Fabiola Backer mine who has subsequently retired.  Patient had both cervix and uterus removed.  Cervix had squamous metaplasia.  Endometrium was proliferative with no evidence of hyperplasia or malignancy.  Diagnosis was intramural leiomyoma and adenomyosis.    Review of Systems see above     Objective:   Physical Exam Blood pressure 140/80, pulse 72 pulse oximetry 99% weight 226 pounds height 5 feet 7 inches  Normal female external genitalia.   Pap taken of vaginal cuff.  No masses on bimanual exam.  Rectovaginal confirms.  She is status post vaginal hysterectomy in 2006.       Assessment & Plan:  Pelvic exam-within normal limits status post vaginal hysterectomy  Plan: Pap taken of vaginal cuff and Pap  result shows atrophic changes but negative for intraepithelial lesion or malignancy.

## 2020-01-26 NOTE — Progress Notes (Incomplete)
   Subjective:    Patient ID: Samantha Hahn, female    DOB: August 03, 1960, 59 y.o.   MRN: [id number]  HPI    Review of Systems     Objective:   Physical Exam        Assessment & Plan:

## 2020-01-28 LAB — CYTOLOGY - PAP
Comment: NEGATIVE
Diagnosis: NEGATIVE
High risk HPV: NEGATIVE

## 2020-01-30 ENCOUNTER — Telehealth: Payer: Self-pay | Admitting: Internal Medicine

## 2020-01-30 NOTE — Telephone Encounter (Signed)
Faxed Pap Smear results to [place]

## 2020-02-02 ENCOUNTER — Telehealth: Payer: Self-pay

## 2020-02-02 NOTE — Telephone Encounter (Signed)
-----   Message from Elby Showers, MD sent at 01/29/2020  9:38 AM EST ----- Please call her. Her Pap is normal and available by MyChart  for her to send to her physicians at [facility]

## 2020-02-02 NOTE — Telephone Encounter (Signed)
Patient informed of pap results.

## 2020-02-05 ENCOUNTER — Ambulatory Visit: Payer: 59 | Admitting: Endocrinology

## 2020-02-19 ENCOUNTER — Encounter: Payer: Self-pay | Admitting: Internal Medicine

## 2020-02-19 NOTE — Patient Instructions (Signed)
Pap taken of vaginal cuff is requested for evaluation of renal transplant due to

## 2020-02-29 ENCOUNTER — Other Ambulatory Visit: Payer: Self-pay | Admitting: Internal Medicine

## 2020-03-01 ENCOUNTER — Ambulatory Visit: Payer: 59 | Admitting: Endocrinology

## 2020-03-08 ENCOUNTER — Other Ambulatory Visit: Payer: 59 | Admitting: Internal Medicine

## 2020-03-08 DIAGNOSIS — Z6836 Body mass index (BMI) 36.0-36.9, adult: Secondary | ICD-10-CM

## 2020-03-08 DIAGNOSIS — Z0000 Encounter for general adult medical examination without abnormal findings: Secondary | ICD-10-CM

## 2020-03-08 DIAGNOSIS — N1831 Chronic kidney disease, stage 3a: Secondary | ICD-10-CM

## 2020-03-08 DIAGNOSIS — E118 Type 2 diabetes mellitus with unspecified complications: Secondary | ICD-10-CM

## 2020-03-08 DIAGNOSIS — N189 Chronic kidney disease, unspecified: Secondary | ICD-10-CM

## 2020-03-08 DIAGNOSIS — F3170 Bipolar disorder, currently in remission, most recent episode unspecified: Secondary | ICD-10-CM

## 2020-03-08 DIAGNOSIS — I10 Essential (primary) hypertension: Secondary | ICD-10-CM

## 2020-03-08 DIAGNOSIS — D649 Anemia, unspecified: Secondary | ICD-10-CM

## 2020-03-08 DIAGNOSIS — E119 Type 2 diabetes mellitus without complications: Secondary | ICD-10-CM

## 2020-03-08 DIAGNOSIS — G4733 Obstructive sleep apnea (adult) (pediatric): Secondary | ICD-10-CM

## 2020-03-11 ENCOUNTER — Encounter: Payer: 59 | Admitting: Internal Medicine

## 2020-03-29 ENCOUNTER — Other Ambulatory Visit: Payer: Self-pay

## 2020-03-29 ENCOUNTER — Other Ambulatory Visit: Payer: 59 | Admitting: Internal Medicine

## 2020-03-30 LAB — LIPID PANEL
Cholesterol: 147 mg/dL (ref <200)
HDL: 46 mg/dL — ABNORMAL LOW (ref >=50)
LDL Cholesterol (Calc): 79 mg/dL (calc)
Non-HDL Cholesterol (Calc): 101 mg/dL (calc) (ref <130)
Total CHOL/HDL Ratio: 3.2 (calc) (ref <5.0)
Triglycerides: 119 mg/dL (ref <150)

## 2020-03-30 LAB — COMPLETE METABOLIC PANEL WITH GFR
AG Ratio: 1.5 (calc) (ref 1.0–2.5)
ALT: 13 U/L (ref 6–29)
AST: 16 U/L (ref 10–35)
Albumin: 3.8 g/dL (ref 3.6–5.1)
Alkaline phosphatase (APISO): 125 U/L (ref 37–153)
BUN/Creatinine Ratio: 15 (calc) (ref 6–22)
BUN: 39 mg/dL — ABNORMAL HIGH (ref 7–25)
CO2: 28 mmol/L (ref 20–32)
Calcium: 9 mg/dL (ref 8.6–10.4)
Chloride: 105 mmol/L (ref 98–110)
Creat: 2.68 mg/dL — ABNORMAL HIGH (ref 0.50–0.99)
GFR, Est African American: 22 mL/min/{1.73_m2} — ABNORMAL LOW (ref >=60)
GFR, Est Non African American: 19 mL/min/{1.73_m2} — ABNORMAL LOW (ref >=60)
Globulin: 2.5 g/dL (calc) (ref 1.9–3.7)
Glucose, Bld: 154 mg/dL — ABNORMAL HIGH (ref 65–99)
Potassium: 4.8 mmol/L (ref 3.5–5.3)
Sodium: 141 mmol/L (ref 135–146)
Total Bilirubin: 0.5 mg/dL (ref 0.2–1.2)
Total Protein: 6.3 g/dL (ref 6.1–8.1)

## 2020-03-30 LAB — CBC WITH DIFFERENTIAL/PLATELET
Absolute Monocytes: 447 cells/uL (ref 200–950)
Basophils Absolute: 42 cells/uL (ref 0–200)
Basophils Relative: 0.8 %
Eosinophils Absolute: 239 cells/uL (ref 15–500)
Eosinophils Relative: 4.6 %
HCT: 33.8 % — ABNORMAL LOW (ref 35.0–45.0)
Hemoglobin: 11.1 g/dL — ABNORMAL LOW (ref 11.7–15.5)
Lymphs Abs: 894 cells/uL (ref 850–3900)
MCH: 30.3 pg (ref 27.0–33.0)
MCHC: 32.8 g/dL (ref 32.0–36.0)
MCV: 92.3 fL (ref 80.0–100.0)
MPV: 10.7 fL (ref 7.5–12.5)
Monocytes Relative: 8.6 %
Neutro Abs: 3578 cells/uL (ref 1500–7800)
Neutrophils Relative %: 68.8 %
Platelets: 174 10*3/uL (ref 140–400)
RBC: 3.66 10*6/uL — ABNORMAL LOW (ref 3.80–5.10)
RDW: 12.4 % (ref 11.0–15.0)
Total Lymphocyte: 17.2 %
WBC: 5.2 10*3/uL (ref 3.8–10.8)

## 2020-03-30 LAB — HEMOGLOBIN A1C
Hgb A1c MFr Bld: 7.8 % of total Hgb — ABNORMAL HIGH (ref <5.7)
Mean Plasma Glucose: 177 mg/dL
eAG (mmol/L): 9.8 mmol/L

## 2020-03-30 LAB — TSH: TSH: 0.59 mIU/L (ref 0.40–4.50)

## 2020-04-01 ENCOUNTER — Other Ambulatory Visit: Payer: Self-pay | Admitting: Internal Medicine

## 2020-04-06 ENCOUNTER — Ambulatory Visit (INDEPENDENT_AMBULATORY_CARE_PROVIDER_SITE_OTHER): Payer: 59 | Admitting: Internal Medicine

## 2020-04-06 ENCOUNTER — Encounter: Payer: Self-pay | Admitting: Internal Medicine

## 2020-04-06 ENCOUNTER — Other Ambulatory Visit: Payer: Self-pay

## 2020-04-06 VITALS — HR 88 | Temp 98.2°F

## 2020-04-06 DIAGNOSIS — N184 Chronic kidney disease, stage 4 (severe): Secondary | ICD-10-CM

## 2020-04-06 DIAGNOSIS — I10 Essential (primary) hypertension: Secondary | ICD-10-CM

## 2020-04-06 DIAGNOSIS — J029 Acute pharyngitis, unspecified: Secondary | ICD-10-CM | POA: Diagnosis not present

## 2020-04-06 DIAGNOSIS — Z8709 Personal history of other diseases of the respiratory system: Secondary | ICD-10-CM

## 2020-04-06 DIAGNOSIS — E118 Type 2 diabetes mellitus with unspecified complications: Secondary | ICD-10-CM

## 2020-04-06 NOTE — Progress Notes (Signed)
   Subjective:    Patient ID: Samantha Hahn, female    DOB: 1960/05/20, 60 y.o.   MRN: [id number]  HPI 60 year old Female with DM had been scheduled for CPE today but has come down with respiratory infection acutely past 24 hours.  Patient has chronic kidney disease and is currently on transplant list at [facility].  Is followed here by [facility].  Patient is a former physical therapist and currently is taking care of her 2 elderly parents.  Patient called earlier today complaining of sore throat and headache.  She has had 2 COVID-19 vaccines in March and April 2021.  Has not had booster.  Needs to get booster when she is feeling better.  She has a history of asthma, hypertension, GE reflux, sleep apnea, chronic kidney disease stage IV, pure hypercholesterolemia in addition to diabetes.  Patient had PET scan recently suggesting some ischemia.  Resting ejection fraction was 53% and post rest ejection fraction was 59%.  There was concern for mild ischemia in the inferior and inferior lateral walls but no evidence of infarct.  Currently on wait list for kidney transplantation pending financial clearance according to recent records at [facility].  Apparently will need a cardiac cath before being listed as active.  Currently not on dialysis.  Has GFR of 18.  She was scheduled for health maintenance exam today but that was canceled due to illness.  Seen acutely and stated.  Review of Systems no fever or shaking chills.  No nausea vomiting or diarrhea.  No shortness of breath.     Objective:   Physical Exam Pulse 88 regular.  Temperature 98.2 degrees.  Pulse oximetry 97% on room air.  Skin is warm and dry.  She is not tachypnea.  Chest is clear to auscultation without rales or wheezing.  Cardiac exam regular rate and rhythm.  Pharynx is slightly injected.  TMs with splayed light reflex bilaterally.       Assessment & Plan:  Acute pharyngitis  Acute upper respiratory infection-rule  out COVID-19 virus infection.  Has not had booster.  What is worrisome as she stays with elderly parents who have not had their booster either.  Chronic kidney disease stage IV-is on transplant list  Diabetes mellitus  Abnormal noninvasive cardiac scan recently at [facility] and will need to have catheterization prior to transplant per records from [place]  Hypertension  Hyperlipidemia  Plan: COVID-19 test obtained by nasal swab as well as respiratory virus panel.  Quarantine at home until results are back.  Rest and drink fluids.  Monitor Accu-Cheks.  Await respiratory virus panel and COVID-19 testing.

## 2020-04-07 ENCOUNTER — Telehealth: Payer: Self-pay | Admitting: Internal Medicine

## 2020-04-07 ENCOUNTER — Inpatient Hospital Stay: Admission: RE | Admit: 2020-04-07 | Payer: 59 | Source: Ambulatory Visit

## 2020-04-07 LAB — RESPIRATORY VIRUS PANEL

## 2020-04-07 LAB — SARS-COV-2 RNA,(COVID-19) QUALITATIVE NAAT: SARS CoV2 RNA: DETECTED — AB

## 2020-04-07 NOTE — Telephone Encounter (Signed)
I called patient. Her Respiratory Virus Panel is negative. Covid test is still pending. Has some sputum production which is mostly white. Feels about the same. Does not sound SOB when speaking. Explained to her this is likely a viral infection. Prefer to hold off prescribing antibiotics. Await Covid test results.

## 2020-04-08 ENCOUNTER — Telehealth: Payer: Self-pay | Admitting: Internal Medicine

## 2020-04-08 ENCOUNTER — Encounter: Payer: Self-pay | Admitting: Internal Medicine

## 2020-04-08 MED ORDER — CEPHALEXIN 250 MG PO CAPS: 250.0000 mg | ORAL_CAPSULE | Freq: Three times a day (TID) | ORAL | 0 refills | Status: DC

## 2020-04-08 NOTE — Telephone Encounter (Signed)
Patient tested positive for Covid-19. Has CKD. Has complaint of ear pain and sore throat. Had dull ears on exam when tested here. Will call in keflex 250 mg 3 times a day x 7 days.

## 2020-04-08 NOTE — Telephone Encounter (Signed)
Faxed Positive COVID-19 results to [facility] [phone], phone [phone]

## 2020-04-10 NOTE — Patient Instructions (Addendum)
COVID-19 test obtained by nasal swab as well as respiratory virus panel.  Quarantine at home until test results are back.  Rest and drink fluids.  Watch Accu-Cheks.

## 2020-04-26 ENCOUNTER — Telehealth: Payer: Self-pay | Admitting: Internal Medicine

## 2020-04-26 MED ORDER — ALBUTEROL SULFATE HFA 108 (90 BASE) MCG/ACT IN AERS: INHALATION_SPRAY | RESPIRATORY_TRACT | 11 refills | Status: DC

## 2020-04-26 NOTE — Telephone Encounter (Signed)
Lavanda Nevels [phone]  [name] called to say she would like a refill on below medication sent to this pharmacy, last time it was sent to [facility] and she never picked it up. She is having some seasonal allergies and breathing issues and needs it.  albuterol (VENTOLIN HFA) 108 (90 Base) MCG/ACT inhaler  [facility] [place], [place] Phone:  [phone]  Fax:  [phone]

## 2020-04-26 NOTE — Telephone Encounter (Signed)
Please refill x one year to pharmacy requested

## 2020-05-22 ENCOUNTER — Other Ambulatory Visit: Payer: Self-pay | Admitting: Internal Medicine

## 2020-05-26 ENCOUNTER — Inpatient Hospital Stay: Admission: RE | Admit: 2020-05-26 | Payer: 59 | Source: Ambulatory Visit

## 2020-07-02 ENCOUNTER — Other Ambulatory Visit: Payer: Self-pay | Admitting: Internal Medicine

## 2020-09-01 ENCOUNTER — Other Ambulatory Visit: Payer: Self-pay | Admitting: Internal Medicine

## 2020-09-01 DIAGNOSIS — E538 Deficiency of other specified B group vitamins: Secondary | ICD-10-CM

## 2020-09-21 ENCOUNTER — Other Ambulatory Visit: Payer: Self-pay

## 2020-09-21 ENCOUNTER — Other Ambulatory Visit: Payer: 59 | Admitting: Internal Medicine

## 2020-09-21 DIAGNOSIS — I10 Essential (primary) hypertension: Secondary | ICD-10-CM

## 2020-09-21 DIAGNOSIS — E119 Type 2 diabetes mellitus without complications: Secondary | ICD-10-CM

## 2020-09-21 DIAGNOSIS — E118 Type 2 diabetes mellitus with unspecified complications: Secondary | ICD-10-CM

## 2020-09-22 LAB — LIPID PANEL
Cholesterol: 184 mg/dL (ref <200)
HDL: 46 mg/dL — ABNORMAL LOW (ref >=50)
LDL Cholesterol (Calc): 101 mg/dL (calc) — ABNORMAL HIGH
Non-HDL Cholesterol (Calc): 138 mg/dL (calc) — ABNORMAL HIGH (ref <130)
Total CHOL/HDL Ratio: 4 (calc) (ref <5.0)
Triglycerides: 245 mg/dL — ABNORMAL HIGH (ref <150)

## 2020-09-22 LAB — HEPATIC FUNCTION PANEL
AG Ratio: 1.3 (calc) (ref 1.0–2.5)
ALT: 14 U/L (ref 6–29)
AST: 14 U/L (ref 10–35)
Albumin: 3.6 g/dL (ref 3.6–5.1)
Alkaline phosphatase (APISO): 167 U/L — ABNORMAL HIGH (ref 37–153)
Bilirubin, Direct: 0.1 mg/dL (ref 0.0–0.2)
Globulin: 2.8 g/dL (calc) (ref 1.9–3.7)
Indirect Bilirubin: 0.4 mg/dL (calc) (ref 0.2–1.2)
Total Bilirubin: 0.5 mg/dL (ref 0.2–1.2)
Total Protein: 6.4 g/dL (ref 6.1–8.1)

## 2020-09-22 LAB — HEMOGLOBIN A1C
Hgb A1c MFr Bld: 12.8 % of total Hgb — ABNORMAL HIGH (ref <5.7)
Mean Plasma Glucose: 321 mg/dL
eAG (mmol/L): 17.8 mmol/L

## 2020-09-27 ENCOUNTER — Other Ambulatory Visit: Payer: Self-pay | Admitting: Internal Medicine

## 2020-09-28 ENCOUNTER — Encounter: Payer: Self-pay | Admitting: Internal Medicine

## 2020-09-28 ENCOUNTER — Ambulatory Visit (INDEPENDENT_AMBULATORY_CARE_PROVIDER_SITE_OTHER): Payer: 59 | Admitting: Internal Medicine

## 2020-09-28 ENCOUNTER — Other Ambulatory Visit: Payer: Self-pay

## 2020-09-28 VITALS — BP 110/70 | HR 92 | Ht 67.0 in | Wt 222.0 lb

## 2020-09-28 DIAGNOSIS — F439 Reaction to severe stress, unspecified: Secondary | ICD-10-CM

## 2020-09-28 DIAGNOSIS — N189 Chronic kidney disease, unspecified: Secondary | ICD-10-CM

## 2020-09-28 DIAGNOSIS — I10 Essential (primary) hypertension: Secondary | ICD-10-CM

## 2020-09-28 DIAGNOSIS — F4321 Adjustment disorder with depressed mood: Secondary | ICD-10-CM | POA: Diagnosis not present

## 2020-09-28 DIAGNOSIS — E119 Type 2 diabetes mellitus without complications: Secondary | ICD-10-CM

## 2020-09-28 DIAGNOSIS — N184 Chronic kidney disease, stage 4 (severe): Secondary | ICD-10-CM

## 2020-09-28 MED ORDER — ACCU-CHEK GUIDE VI STRP: ORAL_STRIP | 11 refills | Status: DC

## 2020-09-28 NOTE — Progress Notes (Signed)
   Subjective:    Patient ID: Samantha Hahn, female    DOB: November 22, 1960, 60 y.o.   MRN: [id number]  HPI 60 year old Female with Type 2 diabetes mellitus without retinopathy, history of hypertension, hyperlipidemia, asthma, anxiety depression and insomnia.  Creatinine began to increase in 20 14-1.49 no proteinuria.  Moderate amount of NSAIDs in her lifetime.  Chronic kidney disease felt to be due to chronic medical illnesses and use of NSAIDs.  Creatinine increased to 2.2 24 August 2016 and 3.36 in 2017-03-17.  Mother passed away.  Now taking care of her father who is in poor health living at home with her father who can be stubborn at times.  She worries about him a lot.  Has been seen at [facility] regarding transplant.  Was seen at [facility] in June.  Patient is sleeping a lot and seems to be depressed.  Has been on Lexapro 20 mg daily and Wellbutrin 300 mg daily but has little energy and has a lot of worry about her father and his health.  Seems that Lexapro would increase serotonin and Wellbutrin with increased dopamine but they do not seem to be helping a great deal.  I think she could benefit from counseling and a psychiatric consult.  She will be referred for that.  Patient wants to go to [facility].  Referral has been placed.  30 minutes spent with patient discussing her situational stress and symptoms.      Review of Systems Creatinine in June was 2.34. Reminded diabetic eye exam due December. No numbness in feet. No foot ulcerations.    Objective:   Physical Exam        Assessment & Plan:

## 2020-10-06 ENCOUNTER — Telehealth: Payer: Self-pay | Admitting: Internal Medicine

## 2020-10-06 NOTE — Telephone Encounter (Signed)
Samantha Hahn [phone]  [name] called to say she had tried to make an appointment to see a psychiatrist, and they said they need a referral. She wants to go to [facility].

## 2020-10-07 NOTE — Telephone Encounter (Signed)
Called Samantha Hahn to let her know referral was placed.

## 2020-10-21 ENCOUNTER — Other Ambulatory Visit: Payer: Self-pay | Admitting: Endocrinology

## 2020-10-21 ENCOUNTER — Other Ambulatory Visit: Payer: Self-pay | Admitting: Internal Medicine

## 2020-10-23 ENCOUNTER — Ambulatory Visit: Payer: 59

## 2020-11-05 ENCOUNTER — Ambulatory Visit (INDEPENDENT_AMBULATORY_CARE_PROVIDER_SITE_OTHER): Payer: 59 | Admitting: Endocrinology

## 2020-11-05 ENCOUNTER — Other Ambulatory Visit: Payer: Self-pay

## 2020-11-05 VITALS — BP 110/64 | HR 84 | Ht 67.0 in | Wt 225.8 lb

## 2020-11-05 DIAGNOSIS — E119 Type 2 diabetes mellitus without complications: Secondary | ICD-10-CM | POA: Diagnosis not present

## 2020-11-05 LAB — POCT GLYCOSYLATED HEMOGLOBIN (HGB A1C): Hemoglobin A1C: 10.8 % — AB (ref 4.0–5.6)

## 2020-11-05 MED ORDER — NOVOLOG FLEXPEN 100 UNIT/ML ~~LOC~~ SOPN: 3.0000 [IU] | PEN_INJECTOR | Freq: Three times a day (TID) | SUBCUTANEOUS | 11 refills | Status: DC

## 2020-11-05 NOTE — Patient Instructions (Addendum)
check your blood sugar once a day.  vary the time of day when you check, between before the 3 meals, and at bedtime.  also check if you have symptoms of your blood sugar being too high or too low.  please keep a record of the readings and bring it to your next appointment here (or you can bring the meter itself).  You can write it on any piece of paper.  please call us sooner if your blood sugar goes below 70, or if you have a lot of readings over 200.  I have sent a prescription to your pharmacy, to add Novolog 3 units 3 times a day (just before each meal) Please continue the same other medications.   Please come back for a follow-up appointment in 2 months.

## 2020-11-05 NOTE — Progress Notes (Signed)
Subjective:    Patient ID: Samantha Hahn, female    DOB: 1960-08-31, 60 y.o.   MRN: [id number]  HPI Pt returns for f/u of diabetes mellitus: DM type: 2 Dx'ed: 6812 Complications: stage 4 CRI Therapy: 2 oral meds GDM: never DKA: never Severe hypoglycemia: never Pancreatitis: never Pancreatic imaging: normal on 2019 CT SDOH: none Other: She did not tolerate Byetta (inject site rash); she has never been on insulin; Interval history: no cbg record, but states cbg's vary from 146-200's.  Nausea is improved but not resolved.   Past Medical History:  Diagnosis Date   Abnormal vaginal Pap smear    Acne rosacea    Allergy    Anemia    Anxiety    Asthma with COPD with status asthmaticus ([place])    Depression    resolved   Diabetes mellitus    Dysrhythmia    pvc   GERD (gastroesophageal reflux disease)    Hot flashes    Hyperlipidemia    preventative meds per pt    Hypertension     resolved,     dr hochrein   Migraines    PVC (premature ventricular contraction)    Sleep apnea    workup still in process    Past Surgical History:  Procedure Laterality Date   ABDOMINAL HYSTERECTOMY     COLONOSCOPY     POLYPECTOMY     SHOULDER ARTHROSCOPY  06/08/2011   Procedure: ARTHROSCOPY SHOULDER;  Surgeon: Marin Shutter, MD;  Location: [facility];  Service: Orthopedics;  Laterality: Left;  MUA, LOA, SAD, DCR    Social History   Socioeconomic History   Marital status: Divorced    Spouse name: Not on file   Number of children: Not on file   Years of education: Not on file   Highest education level: Not on file  Occupational History   Occupation: PT    Employer: [employer]  Tobacco Use   Smoking status: Never   Smokeless tobacco: Never  Vaping Use   Vaping Use: Never used  Substance and Sexual Activity   Alcohol use: Yes    Comment: rare - 1-2 a year    Drug use: No   Sexual activity: Not Currently    Birth control/protection: Surgical  Other Topics Concern   Not on file   Social History Narrative   Not on file   Social Determinants of Health   Financial Resource Strain: Not on file  Food Insecurity: Not on file  Transportation Needs: Not on file  Physical Activity: Not on file  Stress: Not on file  Social Connections: Not on file  Intimate Partner Violence: Not on file    Current Outpatient Medications on File Prior to Visit  Medication Sig Dispense Refill   ACCU-CHEK FASTCLIX LANCETS MISC USE TO TEST TWICE A DAY AS NEEDED 200 each 12   albuterol (VENTOLIN HFA) 108 (90 Base) MCG/ACT inhaler INHALE 2 PUFFS BY MOUTH EVERY 4-6 HOURS AS NEEDED FOR COUGH OR WHEEZE 18 g 11   aspirin 325 MG tablet Take 325 mg by mouth daily.     atorvastatin (LIPITOR) 20 MG tablet TAKE ONE TABLET BY MOUTH DAILY 90 tablet 3   buPROPion (WELLBUTRIN XL) 300 MG 24 hr tablet TAKE ONE TABLET BY MOUTH DAILY 90 tablet 2   Calcium-Vitamin D 600-200 MG-UNIT per tablet Take 1 tablet by mouth daily.     cholecalciferol (VITAMIN D) 1000 units tablet Take 1,000 Units by mouth daily.  cyanocobalamin (,VITAMIN B-12,) 1000 MCG/ML injection INJECT 1 ML INTO THE MUSCLE EVERY 30 DAYS (ONCE MONTHLY) 1 mL 5   escitalopram (LEXAPRO) 20 MG tablet TAKE ONE TABLET BY MOUTH DAILY 90 tablet 1   ferrous fumarate (HEMOCYTE - 106 MG FE) 325 (106 FE) MG TABS Take 1 tablet by mouth daily.     glipiZIDE (GLUCOTROL XL) 2.5 MG 24 hr tablet TAKE ONE TABLET BY MOUTH EVERY MORNING 30 tablet 0   glucose blood (ACCU-CHEK GUIDE) test strip USE TO CHECK BLOOD SUGAR 2 TIMES A DAY AS NEEDED 100 each 11   loratadine (CLARITIN) 10 MG tablet Take 10 mg by mouth daily after breakfast. For allergies     montelukast (SINGULAIR) 10 MG tablet TAKE ONE TABLET BY MOUTH EVERY MORNING 90 tablet 3   multivitamin (THERAGRAN) per tablet Take 1 tablet by mouth daily.     pantoprazole (PROTONIX) 40 MG tablet TAKE ONE TABLET BY MOUTH DAILY 90 tablet 2   pioglitazone (ACTOS) 30 MG tablet TAKE ONE TABLET BY MOUTH DAILY 90 tablet 1    Semaglutide (RYBELSUS) 3 MG TABS Take 3 mg by mouth daily. 30 tablet 11   SYMBICORT 160-4.5 MCG/ACT inhaler INHALE 2 PUFFS INTO THE LUNGS 2 TIMES DAILY. 10.2 g 5   venlafaxine XR (EFFEXOR-XR) 75 MG 24 hr capsule Take 75 mg by mouth daily.     No current facility-administered medications on file prior to visit.    Allergies  Allergen Reactions   Clarithromycin Hives   Vicodin [Hydrocodone-Acetaminophen] Nausea And Vomiting   Exenatide Other (See Comments)    Rash & itching at injection site    Family History  Problem Relation Age of Onset   Heart disease Mother 69       CAD   Asthma Mother    Dementia Mother    Stroke Mother    Breast cancer Mother    Diabetes Mother    Asthma Sister    Heart Problems Father    Diabetes Father    Breast cancer Maternal Grandmother    Colon cancer Neg Hx    Esophageal cancer Neg Hx    Rectal cancer Neg Hx    Stomach cancer Neg Hx    Colon polyps Neg Hx     BP 110/64 (BP Location: Right Arm, Patient Position: Sitting, Cuff Size: Large)   Pulse 84   Ht 5\' 7"  (1.702 m)   Wt 225 lb 12.8 oz (102.4 kg)   SpO2 94%   BMI 35.37 kg/m   Review of Systems She denies hypoglycemia.      Objective:   Physical Exam VITAL SIGNS:  See vs page GENERAL: no distress Pulses: dorsalis pedis intact bilat.   MSK: no deformity of the feet CV: trace bilat leg edema, and bilat vv's.   Skin:  no ulcer on the feet.  normal color and temp on the feet. Neuro: sensation is intact to touch on the feet.  Ext: there is bilateral onychomycosis of the toenails.     Lab Results  Component Value Date   HGBA1C 10.8 (A) 11/05/2020      Assessment & Plan:  Type 2 DM: uncontrolled.  Nausea, due to Rybelsus.  Pt would like to continue  Patient Instructions  check your blood sugar once a day.  vary the time of day when you check, between before the 3 meals, and at bedtime.  also check if you have symptoms of your blood sugar being too high or too low.  please keep  a record of the readings and bring it to your next appointment here (or you can bring the meter itself).  You can write it on any piece of paper.  please call us sooner if your blood sugar goes below 70, or if you have a lot of readings over 200.  I have sent a prescription to your pharmacy, to add Novolog 3 units 3 times a day (just before each meal) Please continue the same other medications.   Please come back for a follow-up appointment in 2 months.

## 2020-11-06 NOTE — Patient Instructions (Signed)
Referral to psychiatry for medication consultation and counseling.

## 2020-11-22 ENCOUNTER — Ambulatory Visit: Payer: 59 | Admitting: Endocrinology

## 2020-11-25 ENCOUNTER — Other Ambulatory Visit: Payer: Self-pay | Admitting: Endocrinology

## 2021-01-06 ENCOUNTER — Other Ambulatory Visit: Payer: Self-pay | Admitting: Endocrinology

## 2021-01-07 ENCOUNTER — Ambulatory Visit: Payer: 59 | Admitting: Endocrinology

## 2021-01-26 LAB — CBC AND DIFFERENTIAL: Hemoglobin: 11.2 — AB (ref 12.0–16.0)

## 2021-01-26 LAB — BASIC METABOLIC PANEL
BUN: 34 — AB (ref 4–21)
CO2: 30 — AB (ref 13–22)
Chloride: 101 (ref 99–108)
Creatinine: 2.3 — AB (ref 0.5–1.1)
Glucose: 426
Potassium: 4.8 (ref 3.4–5.3)
Sodium: 137 (ref 137–147)

## 2021-01-26 LAB — IRON,TIBC AND FERRITIN PANEL
%SAT: 27
Ferritin: 147
Iron: 74
TIBC: 277
UIBC: 203

## 2021-01-26 LAB — COMPREHENSIVE METABOLIC PANEL
Albumin: 3.7 (ref 3.5–5.0)
Calcium: 8.7 (ref 8.7–10.7)
GFR calc non Af Amer: 23

## 2021-02-02 ENCOUNTER — Other Ambulatory Visit: Payer: Self-pay | Admitting: Endocrinology

## 2021-03-04 ENCOUNTER — Other Ambulatory Visit: Payer: Self-pay | Admitting: Internal Medicine

## 2021-03-04 DIAGNOSIS — I10 Essential (primary) hypertension: Secondary | ICD-10-CM

## 2021-03-04 DIAGNOSIS — Z1329 Encounter for screening for other suspected endocrine disorder: Secondary | ICD-10-CM

## 2021-03-04 DIAGNOSIS — E119 Type 2 diabetes mellitus without complications: Secondary | ICD-10-CM

## 2021-03-08 ENCOUNTER — Encounter: Payer: Self-pay | Admitting: Internal Medicine

## 2021-03-08 NOTE — Progress Notes (Unsigned)
Patient did not keep appt for labs or CPE. Does have labs through [facility] which were reviewed in advance of scheduled appt today. Missing Annual CPE is not acceptable. We expect to see her annually. She says she had to take her father somewhere. She will need to keep appt for CPE annually or will be asked to find another CPE.Will not refill meds we prescribe until seen here. MJB, MD

## 2021-03-18 ENCOUNTER — Ambulatory Visit (INDEPENDENT_AMBULATORY_CARE_PROVIDER_SITE_OTHER): Payer: Self-pay | Admitting: Endocrinology

## 2021-03-18 ENCOUNTER — Other Ambulatory Visit: Payer: Self-pay

## 2021-03-18 VITALS — BP 138/90 | HR 98 | Ht 67.0 in | Wt 229.2 lb

## 2021-03-18 DIAGNOSIS — E119 Type 2 diabetes mellitus without complications: Secondary | ICD-10-CM

## 2021-03-18 LAB — POCT GLYCOSYLATED HEMOGLOBIN (HGB A1C): Hemoglobin A1C: 12.4 % — AB (ref 4.0–5.6)

## 2021-03-18 MED ORDER — FREESTYLE LIBRE 2 SENSOR MISC: 1.0000 | 3 refills | Status: DC

## 2021-03-18 MED ORDER — NOVOLOG FLEXPEN 100 UNIT/ML ~~LOC~~ SOPN: 10.0000 [IU] | PEN_INJECTOR | Freq: Three times a day (TID) | SUBCUTANEOUS | 3 refills | Status: DC

## 2021-03-18 MED ORDER — FREESTYLE LIBRE 2 READER DEVI: 1.0000 | Freq: Once | 1 refills | Status: AC

## 2021-03-18 NOTE — Patient Instructions (Addendum)
check your blood sugar once a day.  vary the time of day when you check, between before the 3 meals, and at bedtime.  also check if you have symptoms of your blood sugar being too high or too low.  please keep a record of the readings and bring it to your next appointment here (or you can bring the meter itself).  You can write it on any piece of paper.  please call us sooner if your blood sugar goes below 70, or if you have a lot of readings over 200.   I have sent a prescription to your pharmacy, to increase the Novolog to 10 units 3 times a day (just before each meal) You can stop taking the Rybelsus.  Please continue the same other 2 diabetes medications.   Please come back for a follow-up appointment in 1 month.

## 2021-03-18 NOTE — Progress Notes (Signed)
Subjective:    Patient ID: Samantha Hahn, female    DOB: 1960-06-25, 61 y.o.   MRN: [id number]  HPI Pt returns for f/u of diabetes mellitus:  DM type: Insulin-requiring type 2 Dx'ed: 8413 Complications: stage 4 CRI Therapy: insulin since 2022, and 2 oral meds.   GDM: never DKA: never Severe hypoglycemia: never Pancreatitis: never Pancreatic imaging: normal on 2019 CT SDOH: none Other: She did not tolerate Byetta (inject site rash); she has never been on insulin; Rybelsus dosage is limited by nausea.   Interval history: no cbg record, but states cbg's vary from 200-300's.  She checks fasting only, but other dr appt later in the day found glucose of 400.  She can no longer afford Rybelsus.   Past Medical History:  Diagnosis Date   Abnormal vaginal Pap smear    Acne rosacea    Allergy    Anemia    Anxiety    Asthma with COPD with status asthmaticus ([place])    Depression    resolved   Diabetes mellitus    Dysrhythmia    pvc   GERD (gastroesophageal reflux disease)    Hot flashes    Hyperlipidemia    preventative meds per pt    Hypertension     resolved,     dr hochrein   Migraines    PVC (premature ventricular contraction)    Sleep apnea    workup still in process    Past Surgical History:  Procedure Laterality Date   ABDOMINAL HYSTERECTOMY     COLONOSCOPY     POLYPECTOMY     SHOULDER ARTHROSCOPY  06/08/2011   Procedure: ARTHROSCOPY SHOULDER;  Surgeon: Marin Shutter, MD;  Location: [facility];  Service: Orthopedics;  Laterality: Left;  MUA, LOA, SAD, DCR    Social History   Socioeconomic History   Marital status: Divorced    Spouse name: Not on file   Number of children: Not on file   Years of education: Not on file   Highest education level: Not on file  Occupational History   Occupation: PT    Employer: [employer]  Tobacco Use   Smoking status: Never   Smokeless tobacco: Never  Vaping Use   Vaping Use: Never used  Substance and Sexual Activity   Alcohol  use: Yes    Comment: rare - 1-2 a year    Drug use: No   Sexual activity: Not Currently    Birth control/protection: Surgical  Other Topics Concern   Not on file  Social History Narrative   Social history: She is divorced.  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.  She has 1 adult daughter.  Does not smoke or consume alcohol.  She is now caring for her elderly parents in their home which is stressful.  However she says it is less stressful now that she is living with them.       Family history: Brother with history of hypertrophic cardiomyopathy.  1 sister in good health.  Mother with history of stroke, heart disease, kidney stones as well as diabetes.  Father with history of diabetes.   Social Determinants of Health   Financial Resource Strain: Not on file  Food Insecurity: Not on file  Transportation Needs: Not on file  Physical Activity: Not on file  Stress: Not on file  Social Connections: Not on file  Intimate Partner Violence: Not on file    Current Outpatient Medications on File Prior to  Visit  Medication Sig Dispense Refill   ACCU-CHEK FASTCLIX LANCETS MISC USE TO TEST TWICE A DAY AS NEEDED 200 each 12   albuterol (VENTOLIN HFA) 108 (90 Base) MCG/ACT inhaler INHALE 2 PUFFS BY MOUTH EVERY 4-6 HOURS AS NEEDED FOR COUGH OR WHEEZE 18 g 11   aspirin 325 MG tablet Take 325 mg by mouth daily.     atorvastatin (LIPITOR) 20 MG tablet TAKE ONE TABLET BY MOUTH DAILY 90 tablet 3   buPROPion (WELLBUTRIN XL) 300 MG 24 hr tablet TAKE ONE TABLET BY MOUTH DAILY 90 tablet 2   Calcium-Vitamin D 600-200 MG-UNIT per tablet Take 1 tablet by mouth daily.     cholecalciferol (VITAMIN D) 1000 units tablet Take 1,000 Units by mouth daily.     cyanocobalamin (,VITAMIN B-12,) 1000 MCG/ML injection INJECT 1 ML INTO THE MUSCLE EVERY 30 DAYS (ONCE MONTHLY) 1 mL 5   DROPLET PEN NEEDLES 31G X 8 MM MISC USE AS DIRECTED 100 each 2   escitalopram (LEXAPRO) 20 MG tablet TAKE ONE TABLET  BY MOUTH DAILY 90 tablet 1   ferrous fumarate (HEMOCYTE - 106 MG FE) 325 (106 FE) MG TABS Take 1 tablet by mouth daily.     glipiZIDE (GLUCOTROL XL) 2.5 MG 24 hr tablet TAKE ONE TABLET BY MOUTH EVERY MORNING 90 tablet 3   glucose blood (ACCU-CHEK GUIDE) test strip USE TO CHECK BLOOD SUGAR 2 TIMES A DAY AS NEEDED 100 each 11   loratadine (CLARITIN) 10 MG tablet Take 10 mg by mouth daily after breakfast. For allergies     montelukast (SINGULAIR) 10 MG tablet TAKE ONE TABLET BY MOUTH EVERY MORNING 90 tablet 3   multivitamin (THERAGRAN) per tablet Take 1 tablet by mouth daily.     pantoprazole (PROTONIX) 40 MG tablet TAKE ONE TABLET BY MOUTH DAILY 90 tablet 2   pioglitazone (ACTOS) 30 MG tablet TAKE ONE TABLET BY MOUTH DAILY 90 tablet 1   SYMBICORT 160-4.5 MCG/ACT inhaler INHALE 2 PUFFS INTO THE LUNGS 2 TIMES DAILY. 10.2 g 5   venlafaxine XR (EFFEXOR-XR) 75 MG 24 hr capsule Take 75 mg by mouth daily.     No current facility-administered medications on file prior to visit.    Allergies  Allergen Reactions   Clarithromycin Hives   Vicodin [Hydrocodone-Acetaminophen] Nausea And Vomiting   Exenatide Other (See Comments)    Rash & itching at injection site    Family History  Problem Relation Age of Onset   Heart disease Mother 39       CAD   Asthma Mother    Dementia Mother    Stroke Mother    Breast cancer Mother    Diabetes Mother    Asthma Sister    Heart Problems Father    Diabetes Father    Breast cancer Maternal Grandmother    Colon cancer Neg Hx    Esophageal cancer Neg Hx    Rectal cancer Neg Hx    Stomach cancer Neg Hx    Colon polyps Neg Hx     BP 138/90    Pulse 98    Ht 5\' 7"  (1.702 m)    Wt 229 lb 3.2 oz (104 kg)    SpO2 98%    BMI 35.90 kg/m    Review of Systems     Objective:   Physical Exam    Lab Results  Component Value Date   HGBA1C 12.4 (A) 03/18/2021      Assessment & Plan:  Insulin-requiring type  2 DM:: uncontrolled  Patient Instructions   check your blood sugar once a day.  vary the time of day when you check, between before the 3 meals, and at bedtime.  also check if you have symptoms of your blood sugar being too high or too low.  please keep a record of the readings and bring it to your next appointment here (or you can bring the meter itself).  You can write it on any piece of paper.  please call us sooner if your blood sugar goes below 70, or if you have a lot of readings over 200.   I have sent a prescription to your pharmacy, to increase the Novolog to 10 units 3 times a day (just before each meal) You can stop taking the Rybelsus.  Please continue the same other 2 diabetes medications.   Please come back for a follow-up appointment in 1 month.

## 2021-04-08 ENCOUNTER — Other Ambulatory Visit: Payer: Self-pay | Admitting: Internal Medicine

## 2021-04-15 DIAGNOSIS — E1121 Type 2 diabetes mellitus with diabetic nephropathy: Secondary | ICD-10-CM | POA: Diagnosis not present

## 2021-04-15 DIAGNOSIS — Z940 Kidney transplant status: Secondary | ICD-10-CM | POA: Diagnosis not present

## 2021-04-15 DIAGNOSIS — N184 Chronic kidney disease, stage 4 (severe): Secondary | ICD-10-CM | POA: Diagnosis not present

## 2021-04-15 DIAGNOSIS — T8610 Unspecified complication of kidney transplant: Secondary | ICD-10-CM | POA: Diagnosis not present

## 2021-04-15 DIAGNOSIS — Z7682 Awaiting organ transplant status: Secondary | ICD-10-CM | POA: Diagnosis not present

## 2021-04-18 ENCOUNTER — Ambulatory Visit: Payer: Self-pay | Admitting: Endocrinology

## 2021-04-29 ENCOUNTER — Ambulatory Visit: Payer: Self-pay | Admitting: Endocrinology

## 2021-05-07 ENCOUNTER — Other Ambulatory Visit: Payer: Self-pay | Admitting: Internal Medicine

## 2021-05-13 DIAGNOSIS — E1122 Type 2 diabetes mellitus with diabetic chronic kidney disease: Secondary | ICD-10-CM | POA: Diagnosis not present

## 2021-05-13 DIAGNOSIS — Z01810 Encounter for preprocedural cardiovascular examination: Secondary | ICD-10-CM | POA: Diagnosis not present

## 2021-05-13 DIAGNOSIS — N189 Chronic kidney disease, unspecified: Secondary | ICD-10-CM | POA: Diagnosis not present

## 2021-05-13 DIAGNOSIS — I129 Hypertensive chronic kidney disease with stage 1 through stage 4 chronic kidney disease, or unspecified chronic kidney disease: Secondary | ICD-10-CM | POA: Diagnosis not present

## 2021-05-17 ENCOUNTER — Other Ambulatory Visit: Payer: Self-pay | Admitting: Internal Medicine

## 2021-05-26 ENCOUNTER — Other Ambulatory Visit: Payer: Self-pay | Admitting: Internal Medicine

## 2021-05-28 ENCOUNTER — Other Ambulatory Visit: Payer: Self-pay | Admitting: Internal Medicine

## 2021-06-06 ENCOUNTER — Other Ambulatory Visit: Payer: Self-pay | Admitting: Internal Medicine

## 2021-06-16 ENCOUNTER — Other Ambulatory Visit: Payer: 59

## 2021-06-16 DIAGNOSIS — E1169 Type 2 diabetes mellitus with other specified complication: Secondary | ICD-10-CM | POA: Diagnosis not present

## 2021-06-16 DIAGNOSIS — I10 Essential (primary) hypertension: Secondary | ICD-10-CM

## 2021-06-16 DIAGNOSIS — E119 Type 2 diabetes mellitus without complications: Secondary | ICD-10-CM

## 2021-06-16 DIAGNOSIS — R5383 Other fatigue: Secondary | ICD-10-CM

## 2021-06-16 DIAGNOSIS — E785 Hyperlipidemia, unspecified: Secondary | ICD-10-CM | POA: Diagnosis not present

## 2021-06-17 LAB — COMPLETE METABOLIC PANEL WITH GFR
AG Ratio: 1.4 (calc) (ref 1.0–2.5)
ALT: 15 U/L (ref 6–29)
AST: 16 U/L (ref 10–35)
Albumin: 3.7 g/dL (ref 3.6–5.1)
Alkaline phosphatase (APISO): 111 U/L (ref 37–153)
BUN/Creatinine Ratio: 14 (calc) (ref 6–22)
BUN: 41 mg/dL — ABNORMAL HIGH (ref 7–25)
CO2: 30 mmol/L (ref 20–32)
Calcium: 8.8 mg/dL (ref 8.6–10.4)
Chloride: 102 mmol/L (ref 98–110)
Creat: 2.85 mg/dL — ABNORMAL HIGH (ref 0.50–1.05)
Globulin: 2.7 g/dL (calc) (ref 1.9–3.7)
Glucose, Bld: 238 mg/dL — ABNORMAL HIGH (ref 65–99)
Potassium: 4.5 mmol/L (ref 3.5–5.3)
Sodium: 140 mmol/L (ref 135–146)
Total Bilirubin: 0.6 mg/dL (ref 0.2–1.2)
Total Protein: 6.4 g/dL (ref 6.1–8.1)
eGFR: 18 mL/min/{1.73_m2} — ABNORMAL LOW (ref >=60)

## 2021-06-17 LAB — HEMOGLOBIN A1C
Hgb A1c MFr Bld: 9.6 % of total Hgb — ABNORMAL HIGH (ref <5.7)
Mean Plasma Glucose: 229 mg/dL
eAG (mmol/L): 12.7 mmol/L

## 2021-06-17 LAB — CBC WITH DIFFERENTIAL/PLATELET
Absolute Monocytes: 389 cells/uL (ref 200–950)
Basophils Absolute: 38 cells/uL (ref 0–200)
Basophils Relative: 0.7 %
Eosinophils Absolute: 292 cells/uL (ref 15–500)
Eosinophils Relative: 5.4 %
HCT: 36.2 % (ref 35.0–45.0)
Hemoglobin: 11.4 g/dL — ABNORMAL LOW (ref 11.7–15.5)
Lymphs Abs: 886 cells/uL (ref 850–3900)
MCH: 29.5 pg (ref 27.0–33.0)
MCHC: 31.5 g/dL — ABNORMAL LOW (ref 32.0–36.0)
MCV: 93.8 fL (ref 80.0–100.0)
MPV: 11.5 fL (ref 7.5–12.5)
Monocytes Relative: 7.2 %
Neutro Abs: 3796 cells/uL (ref 1500–7800)
Neutrophils Relative %: 70.3 %
Platelets: 183 10*3/uL (ref 140–400)
RBC: 3.86 10*6/uL (ref 3.80–5.10)
RDW: 12.1 % (ref 11.0–15.0)
Total Lymphocyte: 16.4 %
WBC: 5.4 10*3/uL (ref 3.8–10.8)

## 2021-06-17 LAB — LIPID PANEL
Cholesterol: 161 mg/dL (ref <200)
HDL: 49 mg/dL — ABNORMAL LOW (ref >=50)
LDL Cholesterol (Calc): 85 mg/dL (calc)
Non-HDL Cholesterol (Calc): 112 mg/dL (calc) (ref <130)
Total CHOL/HDL Ratio: 3.3 (calc) (ref <5.0)
Triglycerides: 176 mg/dL — ABNORMAL HIGH (ref <150)

## 2021-06-17 LAB — TSH: TSH: 0.57 mIU/L (ref 0.40–4.50)

## 2021-06-21 ENCOUNTER — Encounter: Payer: Self-pay | Admitting: Internal Medicine

## 2021-06-23 ENCOUNTER — Encounter: Payer: Self-pay | Admitting: Internal Medicine

## 2021-06-25 ENCOUNTER — Other Ambulatory Visit: Payer: Self-pay | Admitting: Internal Medicine

## 2021-07-04 ENCOUNTER — Telehealth: Payer: Self-pay | Admitting: Internal Medicine

## 2021-07-04 MED ORDER — MONTELUKAST SODIUM 10 MG PO TABS: 10.0000 mg | ORAL_TABLET | Freq: Every morning | ORAL | 0 refills | Status: DC

## 2021-07-04 NOTE — Telephone Encounter (Signed)
[facility] ?[phone] ? ?Samantha Hahn called to say she received her letter and she is sorry, but she certainly understands. She is looking for new doctor however she needs refill on below medication till she can get new doctor. ? ?Dismissal Letter dated 06/23/2021  ? ?Montelukast ? ?[facility] [id number], [place] Phone:  [phone]  ?Fax:  [phone]  ?  ? ? ? ?

## 2021-07-04 NOTE — Telephone Encounter (Signed)
Done

## 2021-07-16 ENCOUNTER — Other Ambulatory Visit: Payer: Self-pay | Admitting: Internal Medicine

## 2021-08-14 ENCOUNTER — Other Ambulatory Visit: Payer: Self-pay | Admitting: Internal Medicine

## 2021-08-30 ENCOUNTER — Other Ambulatory Visit: Payer: Self-pay | Admitting: Internal Medicine

## 2021-09-06 ENCOUNTER — Other Ambulatory Visit: Payer: Self-pay | Admitting: Internal Medicine

## 2021-09-06 DIAGNOSIS — E538 Deficiency of other specified B group vitamins: Secondary | ICD-10-CM

## 2021-09-07 ENCOUNTER — Ambulatory Visit: Payer: 59 | Admitting: Family

## 2021-10-20 ENCOUNTER — Telehealth: Payer: Self-pay | Admitting: *Deleted

## 2021-10-20 ENCOUNTER — Ambulatory Visit: Payer: 59 | Admitting: Family

## 2021-10-20 NOTE — Chronic Care Management (AMB) (Signed)
  Care Coordination  Outreach Note  10/20/2021 Name: Samantha Hahn MRN: [id number] DOB: December 12, 1960   Care Coordination Outreach Attempts  An unsuccessful telephone outreach was attempted today to offer the patient information about available care coordination services as a benefit of their health plan.   Follow Up Plan:  Additional outreach attempts will be made to offer the patient care coordination information and services.   Encounter Outcome:  No Answer   [place]  Direct Dial: [phone]

## 2021-10-25 ENCOUNTER — Ambulatory Visit: Payer: 59 | Admitting: Internal Medicine

## 2021-10-25 ENCOUNTER — Encounter: Payer: Self-pay | Admitting: Internal Medicine

## 2021-10-25 VITALS — BP 130/74 | HR 110 | Ht 67.0 in | Wt 234.4 lb

## 2021-10-25 DIAGNOSIS — N184 Chronic kidney disease, stage 4 (severe): Secondary | ICD-10-CM | POA: Diagnosis not present

## 2021-10-25 DIAGNOSIS — E785 Hyperlipidemia, unspecified: Secondary | ICD-10-CM | POA: Insufficient documentation

## 2021-10-25 DIAGNOSIS — E1122 Type 2 diabetes mellitus with diabetic chronic kidney disease: Secondary | ICD-10-CM

## 2021-10-25 DIAGNOSIS — Z794 Long term (current) use of insulin: Secondary | ICD-10-CM

## 2021-10-25 LAB — POCT GLYCOSYLATED HEMOGLOBIN (HGB A1C): Hemoglobin A1C: 11.3 % — AB (ref 4.0–5.6)

## 2021-10-25 MED ORDER — PIOGLITAZONE HCL 30 MG PO TABS: 30.0000 mg | ORAL_TABLET | Freq: Every day | ORAL | 3 refills | Status: DC

## 2021-10-25 MED ORDER — BASAGLAR KWIKPEN 100 UNIT/ML ~~LOC~~ SOPN: 14.0000 [IU] | PEN_INJECTOR | Freq: Every day | SUBCUTANEOUS | 3 refills | Status: DC

## 2021-10-25 NOTE — Patient Instructions (Addendum)
Please stop Glipizide ER.  Continue: - Actos 30 mg daily at bedtime  Start: - Basaglar 14 units at bedtime - increase by 2 units every 2-4 units every 2 days until sugars in am <140 or until you reach 40 units  Change: - NovoLog 6-12 units 15 min before each main meal  Please return in 3 months.   PATIENT INSTRUCTIONS FOR TYPE 2 DIABETES:  DIET AND EXERCISE Diet and exercise is an important part of diabetic treatment.  We recommended aerobic exercise in the form of brisk walking (working between 40-60% of maximal aerobic capacity, similar to brisk walking) for 150 minutes per week (such as 30 minutes five days per week) along with 3 times per week performing 'resistance' training (using various gauge rubber tubes with handles) 5-10 exercises involving the major muscle groups (upper body, lower body and core) performing 10-15 repetitions (or near fatigue) each exercise. Start at half the above goal but build slowly to reach the above goals. If limited by weight, joint pain, or disability, we recommend daily walking in a swimming pool with water up to waist to reduce pressure from joints while allow for adequate exercise.    BLOOD GLUCOSES Monitoring your blood glucoses is important for continued management of your diabetes. Please check your blood glucoses 2-4 times a day: fasting, before meals and at bedtime (you can rotate these measurements - e.g. one day check before the 3 meals, the next day check before 2 of the meals and before bedtime, etc.).   HYPOGLYCEMIA (low blood sugar) Hypoglycemia is usually a reaction to not eating, exercising, or taking too much insulin/ other diabetes drugs.  Symptoms include tremors, sweating, hunger, confusion, headache, etc. Treat IMMEDIATELY with 15 grams of Carbs: 4 glucose tablets  cup regular juice/soda 2 tablespoons raisins 4 teaspoons sugar 1 tablespoon honey Recheck blood glucose in 15 mins and repeat above if still symptomatic/blood glucose  <100.  RECOMMENDATIONS TO REDUCE YOUR RISK OF DIABETIC COMPLICATIONS: * Take your prescribed MEDICATION(S) * Follow a DIABETIC diet: Complex carbs, fiber rich foods, (monounsaturated and polyunsaturated) fats * AVOID saturated/trans fats, high fat foods, >2,300 mg salt per day. * EXERCISE at least 5 times a week for 30 minutes or preferably daily.  * DO NOT SMOKE OR DRINK more than 1 drink a day. * Check your FEET every day. Do not wear tightfitting shoes. Contact us if you develop an ulcer * See your EYE doctor once a year or more if needed * Get a FLU shot once a year * Get a PNEUMONIA vaccine once before and once after age 22 years  GOALS:  * Your Hemoglobin A1c of <7%  * fasting sugars need to be <130 * after meals sugars need to be <180 (2h after you start eating) * Your Systolic BP should be 540 or lower  * Your Diastolic BP should be 80 or lower  * Your HDL (Good Cholesterol) should be 40 or higher  * Your LDL (Bad Cholesterol) should be 100 or lower. * Your Triglycerides should be 150 or lower  * Your Urine microalbumin (kidney function) should be <30 * Your Body Mass Index should be 25 or lower    Please consider the following ways to cut down carbs and fat and increase fiber and micronutrients in your diet: - substitute whole grain for white bread or pasta - substitute brown rice for white rice - substitute 90-calorie flat bread pieces for slices of bread when possible - substitute sweet potatoes  or yams for white potatoes - substitute humus for margarine - substitute tofu for cheese when possible - substitute almond or rice milk for regular milk (would not drink soy milk daily due to concern for soy estrogen influence on breast cancer risk) - substitute dark chocolate for other sweets when possible - substitute water - can add lemon or orange slices for taste - for diet sodas (artificial sweeteners will trick your body that you can eat sweets without getting calories and  will lead you to overeating and weight gain in the long run) - do not skip breakfast or other meals (this will slow down the metabolism and will result in more weight gain over time)  - can try smoothies made from fruit and almond/rice milk in am instead of regular breakfast - can also try old-fashioned (not instant) oatmeal made with almond/rice milk in am - order the dressing on the side when eating salad at a restaurant (pour less than half of the dressing on the salad) - eat as little meat as possible - can try juicing, but should not forget that juicing will get rid of the fiber, so would alternate with eating raw veg./fruits or drinking smoothies - use as little oil as possible, even when using olive oil - can dress a salad with a mix of balsamic vinegar and lemon juice, for e.g. - use agave nectar, stevia sugar, or regular sugar rather than artificial sweateners - steam or broil/roast veggies  - snack on veggies/fruit/nuts (unsalted, preferably) when possible, rather than processed foods - reduce or eliminate aspartame in diet (it is in diet sodas, chewing gum, etc) Read the labels!  Try to read Dr. Janene Harvey book: "Program for Reversing Diabetes" for other ideas for healthy eating.

## 2021-10-25 NOTE — Progress Notes (Signed)
Patient ID: Samantha Hahn, female   DOB: 1960/07/05, 61 y.o.   MRN: [id number]  HPI: Samantha Hahn is a 61 y.o.-year-old female, returning for follow-up for DM2, dx in 2003, insulin-dependent since 2022, uncontrolled, with complications (CKD stage 4). Pt. previously saw Dr. Loanne Hahn, last visit 7.5 months ago.  Before last visit with Dr. Loanne Hahn she was walking more and sugars improved.  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  Reviewed HbA1c: Lab Results  Component Value Date   HGBA1C 9.6 (H) 06/16/2021   HGBA1C 12.4 (A) 03/18/2021   HGBA1C 10.8 (A) 11/05/2020   HGBA1C 12.8 (H) 09/21/2020   HGBA1C 7.8 (H) 03/29/2020   HGBA1C 7.6 (A) 12/01/2019   HGBA1C 7.7 (H) 09/09/2019   HGBA1C 6.7 (H) 03/03/2019   HGBA1C 6.6 (H) 09/02/2018   HGBA1C 6.9 (H) 03/05/2018   Pt is on a regimen of: - Glipizide XL 2.5 mg before breakfast - Actos 30 mg at night - Novolog 10 units 3x a day, before meals She had a rash with [place]. She had nausea and vomiting with Rybelsus.  Pt checks her sugars 2-3x a day and they are: - am: 233-320, 449 - 2h after b'fast: n/c - before lunch: n/c - 2h after lunch: 59, 80, 150-240 - before dinner: n/c - 2h after dinner: n/c - bedtime: upper 200s - nighttime: n/c Lowest sugar was 59; she has hypoglycemia awareness at 70.  Highest sugar was 449.  Glucometer: Accu-Chek  - + CKD  - stage 4 - on the renal transplant list at [facility], last BUN/creatinine:  Lab Results  Component Value Date   BUN 41 (H) 06/16/2021   BUN 34 (A) 01/26/2021   CREATININE 2.85 (H) 06/16/2021   CREATININE 2.3 (A) 01/26/2021  She is not on ACE inhibitor/ARB.  -+ HL; last set of lipids: Lab Results  Component Value Date   CHOL 161 06/16/2021   HDL 49 (L) 06/16/2021   LDLCALC 85 06/16/2021   TRIG 176 (H) 06/16/2021   CHOLHDL 3.3 06/16/2021  She is on Lipitor 20 mg daily.  - last eye exam was in 2021. No DR reportedly. She  has a separate [employer] 2020 eye insurance.  - no numbness and tingling in her feet.  Last foot exam 11/05/2020.  She also has a history of HTN, OSA, anemia, GERD, PVCs, anxiety.  ROS: + see HPI No increased urination, blurry vision, nausea, chest pain.  Past Medical History:  Diagnosis Date   Abnormal vaginal Pap smear    Acne rosacea    Allergy    Anemia    Anxiety    Asthma with COPD with status asthmaticus ([place])    Depression    resolved   Diabetes mellitus    Dysrhythmia    pvc   GERD (gastroesophageal reflux disease)    Hot flashes    Hyperlipidemia    preventative meds per pt    Hypertension     resolved,     dr hochrein   Migraines    PVC (premature ventricular contraction)    Sleep apnea    workup still in process   Past Surgical History:  Procedure Laterality Date   ABDOMINAL HYSTERECTOMY     COLONOSCOPY     POLYPECTOMY     SHOULDER ARTHROSCOPY  06/08/2011   Procedure: ARTHROSCOPY SHOULDER;  Surgeon: Marin Shutter, MD;  Location: [facility];  Service: Orthopedics;  Laterality: Left;  MUA, LOA, SAD, DCR   Social History   Socioeconomic History   Marital status: Divorced    Spouse name: Not on file   Number of children: Not on file   Years of education: Not on file   Highest education level: Not on file  Occupational History   Occupation: PT    Employer: [employer]  Tobacco Use   Smoking status: Never   Smokeless tobacco: Never  Vaping Use   Vaping Use: Never used  Substance and Sexual Activity   Alcohol use: Yes    Comment: rare - 1-2 a year    Drug use: No   Sexual activity: Not Currently    Birth control/protection: Surgical  Other Topics Concern   Not on file  Social History Narrative   Social history: She is divorced.  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.  She has 1 adult daughter.  Does not smoke or consume alcohol.  She is now caring for her elderly parents in their home which is stressful.  However she says it is  less stressful now that she is living with them.       Family history: Brother with history of hypertrophic cardiomyopathy.  1 sister in good health.  Mother with history of stroke, heart disease, kidney stones as well as diabetes.  Father with history of diabetes.   Social Determinants of Health   Financial Resource Strain: Not on file  Food Insecurity: Not on file  Transportation Needs: Not on file  Physical Activity: Not on file  Stress: Not on file  Social Connections: Not on file  Intimate Partner Violence: Not on file   Current Outpatient Medications on File Prior to Visit  Medication Sig Dispense Refill   ACCU-CHEK FASTCLIX LANCETS MISC USE TO TEST TWICE A DAY AS NEEDED 200 each 12   albuterol (VENTOLIN HFA) 108 (90 Base) MCG/ACT inhaler INHALE TWO PUFFS BY MOUTH EVERY 4 TO 6 HOURS AS NEEDED FOR COUGH OR WHEEZING 18 g 1   aspirin 325 MG tablet Take 325 mg by mouth daily.     atorvastatin (LIPITOR) 20 MG tablet TAKE ONE TABLET BY MOUTH DAILY 30 tablet 0   buPROPion (WELLBUTRIN XL) 300 MG 24 hr tablet TAKE ONE TABLET BY MOUTH DAILY 90 tablet 2   Calcium-Vitamin D 600-200 MG-UNIT per tablet Take 1 tablet by mouth daily.     cholecalciferol (VITAMIN D) 1000 units tablet Take 1,000 Units by mouth daily.     Continuous Blood Gluc Sensor (FREESTYLE LIBRE 2 SENSOR) MISC 1 Device by Does not apply route every 14 (fourteen) days. 6 each 3   cyanocobalamin (,VITAMIN B-12,) 1000 MCG/ML injection INJECT 1 ML INTO THE MUSCLE EVERY 30 DAYS (ONCE MONTHLY) 1 mL 5   DROPLET PEN NEEDLES 31G X 8 MM MISC USE AS DIRECTED 100 each 2   escitalopram (LEXAPRO) 20 MG tablet TAKE ONE TABLET BY MOUTH DAILY 90 tablet 1   ferrous fumarate (HEMOCYTE - 106 MG FE) 325 (106 FE) MG TABS Take 1 tablet by mouth daily.     glipiZIDE (GLUCOTROL XL) 2.5 MG 24 hr tablet TAKE ONE TABLET BY MOUTH EVERY MORNING 90 tablet 3   glucose blood (ACCU-CHEK GUIDE) test strip USE TO CHECK BLOOD SUGAR 2 TIMES A DAY AS NEEDED 100 each  11   insulin aspart (NOVOLOG FLEXPEN) 100 UNIT/ML FlexPen Inject 10 Units into the skin 3 (three) times daily with meals. And pen needles 1/day 30 mL  3   loratadine (CLARITIN) 10 MG tablet Take 10 mg by mouth daily after breakfast. For allergies     montelukast (SINGULAIR) 10 MG tablet Take 1 tablet (10 mg total) by mouth every morning. 30 tablet 0   multivitamin (THERAGRAN) per tablet Take 1 tablet by mouth daily.     pantoprazole (PROTONIX) 40 MG tablet TAKE ONE TABLET BY MOUTH DAILY 90 tablet 2   pioglitazone (ACTOS) 30 MG tablet TAKE ONE TABLET BY MOUTH DAILY 90 tablet 1   SYMBICORT 160-4.5 MCG/ACT inhaler INHALE 2 PUFFS INTO THE LUNGS 2 TIMES DAILY. 10.2 g 5   venlafaxine XR (EFFEXOR-XR) 75 MG 24 hr capsule Take 75 mg by mouth daily.     No current facility-administered medications on file prior to visit.   Allergies  Allergen Reactions   Clarithromycin Hives   Vicodin [Hydrocodone-Acetaminophen] Nausea And Vomiting   Exenatide Other (See Comments)    Rash & itching at injection site   Family History  Problem Relation Age of Onset   Heart disease Mother 33       CAD   Asthma Mother    Dementia Mother    Stroke Mother    Breast cancer Mother    Diabetes Mother    Asthma Sister    Heart Problems Father    Diabetes Father    Breast cancer Maternal Grandmother    Colon cancer Neg Hx    Esophageal cancer Neg Hx    Rectal cancer Neg Hx    Stomach cancer Neg Hx    Colon polyps Neg Hx     PE: BP 130/74 (BP Location: Right Arm, Patient Position: Sitting, Cuff Size: Normal)   Pulse (!) 110   Ht '5\' 7"'$  (1.702 m)   Wt 234 lb 6.4 oz (106.3 kg)   SpO2 97%   BMI 36.71 kg/m  Wt Readings from Last 3 Encounters:  10/25/21 234 lb 6.4 oz (106.3 kg)  03/18/21 229 lb 3.2 oz (104 kg)  11/05/20 225 lb 12.8 oz (102.4 kg)   Constitutional: overweight, in NAD Eyes: no exophthalmos ENT: moist mucous membranes, no thyromegaly, no cervical lymphadenopathy Cardiovascular: tachycardia, RR,  No MRG Respiratory: CTA B Musculoskeletal: no deformities Skin: moist, warm, no rashes Neurological: +  tremor with outstretched hands Diabetic Foot Exam - Simple   Simple Foot Form Diabetic Foot exam was performed with the following findings: Yes 10/25/2021  3:50 PM  Visual Inspection No deformities, no ulcerations, no other skin breakdown bilaterally: Yes Sensation Testing Intact to touch and monofilament testing bilaterally: Yes Pulse Check Posterior Tibialis and Dorsalis pulse intact bilaterally: Yes Comments L foot haluceal toenail indurated     ASSESSMENT: 1. DM2, insulin-dependent, uncontrolled, with complications - stage 4 CKD  2. HL  PLAN:  1. Patient with long-standing, uncontrolled diabetes, on oral antidiabetic regimen with sulfonylurea and TZD, and also mealtime NovoLog, with still poor control.  Latest HbA1c was 9.6% in 05/2021, decreased from 12.4%.  At today's visit, HbA1c is higher, at 11.3%.  Upon questioning, she did not have as much time for herself as she had before due to being a caregiver for her father and feels that this contributed to her higher blood sugars.   -Blood sugars are very high in the morning, in the 200s to 400s range, but lower later in the day.  She even had a low blood sugar at 59.  At that time, she took the full dose of NovoLog and only ate a salad.   -  She is currently only taking rapid acting insulin before each meal along with a low-dose of sulfonylurea and TZD at night.  Due to the very high blood sugars in the morning and also the high HbA1c, it appears that we need basal insulin.  She agrees with this plan. Will start at a lower dose and we discussed titrating the dose up depending on the blood sugars.  We will go ahead and stop her glipizide ER and also advised her to vary the dose of NovoLog based on the size and consistency of the meal she is planning to eat and also based on the blood sugars before the meals.  At next visit, I am hoping  that we can start reducing Actos.  We also discussed about possibly trying a GLP-1 receptor agonist again.  She tried Rybelsus and had nausea and vomiting but she is open to try another formulation at a low dose.  However, for now, we need to bring her blood sugars better under control and will discussed about the GLP receptor agonist again at next visit - I suggested to:  Patient Instructions  Please stop Glipizide ER.  Continue: - Actos 30 mg daily at bedtime  Start: - Basaglar 14 units at bedtime - increase by 2 units every 2-4 units every 2 days until sugars in am <140 or until you reach 40 units  Change: - NovoLog 6-12 units 15 min before each main meal  Please return in 3 months.   - check sugars at different times of the day - check 3-4x a day, rotating checks - discussed about CBG targets for treatment: 80-130 mg/dL before meals and <180 mg/dL after meals; target HbA1c <7%. - given foot care handout  - given instructions for hypoglycemia management "15-15 rule"  - advised for yearly eye exams  - Return to clinic in 3 mo   2. HL - Reviewed latest lipid panel from 05/2021: LDL above our target of less than 70, triglycerides high, HDL low: Lab Results  Component Value Date   CHOL 161 06/16/2021   HDL 49 (L) 06/16/2021   LDLCALC 85 06/16/2021   TRIG 176 (H) 06/16/2021   CHOLHDL 3.3 06/16/2021  - Continues Lipitor 20 without side effects.  Philemon Kingdom, MD PhD [facility] Endocrinology

## 2021-10-25 NOTE — Addendum Note (Signed)
Addended by: Lauralyn Primes on: 10/25/2021 04:50 PM   Modules accepted: Orders

## 2021-10-25 NOTE — Chronic Care Management (AMB) (Signed)
  Care Coordination  Outreach Note  10/25/2021 Name: Samantha Hahn MRN: [id number] DOB: 12-30-1960   Care Coordination Outreach Attempts  A second unsuccessful outreach was attempted today to offer the patient with information about available care coordination services as a benefit of their health plan.     Follow Up Plan:  Additional outreach attempts will be made to offer the patient care coordination information and services.   Encounter Outcome:  No Answer  [place]  Direct Dial: [phone]

## 2021-10-27 NOTE — Chronic Care Management (AMB) (Signed)
  Care Coordination   Note   10/27/2021 Name: Samantha Hahn MRN: [id number] DOB: 01-12-1961  Samantha Hahn is a 61 y.o. year old female who sees Pcp, No for primary care. I reached out to Despina Pole by phone today to offer care coordination services.  [name] was given information about Care Coordination services today including:   The Care Coordination services include support from the care team which includes your Nurse Coordinator, Clinical Social Worker, or Pharmacist.  The Care Coordination team is here to help remove barriers to the health concerns and goals most important to you. Care Coordination services are voluntary, and the patient may decline or stop services at any time by request to their care team member.   Care Coordination Consent Status: Patient did not agree to participate in care coordination services at this time.    Encounter Outcome:  Pt. Refused  [facility]  Direct Dial: [phone]

## 2021-11-18 DIAGNOSIS — K30 Functional dyspepsia: Secondary | ICD-10-CM | POA: Diagnosis not present

## 2021-11-18 DIAGNOSIS — K2900 Acute gastritis without bleeding: Secondary | ICD-10-CM | POA: Diagnosis not present

## 2021-11-18 DIAGNOSIS — Z760 Encounter for issue of repeat prescription: Secondary | ICD-10-CM | POA: Diagnosis not present

## 2021-11-21 DIAGNOSIS — I10 Essential (primary) hypertension: Secondary | ICD-10-CM | POA: Diagnosis not present

## 2021-11-21 DIAGNOSIS — E119 Type 2 diabetes mellitus without complications: Secondary | ICD-10-CM | POA: Diagnosis not present

## 2021-11-21 DIAGNOSIS — N183 Chronic kidney disease, stage 3 unspecified: Secondary | ICD-10-CM | POA: Diagnosis not present

## 2021-11-21 DIAGNOSIS — E669 Obesity, unspecified: Secondary | ICD-10-CM | POA: Diagnosis not present

## 2021-11-30 ENCOUNTER — Ambulatory Visit (INDEPENDENT_AMBULATORY_CARE_PROVIDER_SITE_OTHER): Payer: 59 | Admitting: Physician Assistant

## 2021-11-30 ENCOUNTER — Encounter: Payer: Self-pay | Admitting: Physician Assistant

## 2021-11-30 VITALS — BP 130/80 | HR 100 | Temp 98.2°F | Ht 68.0 in | Wt 233.0 lb

## 2021-11-30 DIAGNOSIS — E119 Type 2 diabetes mellitus without complications: Secondary | ICD-10-CM | POA: Diagnosis not present

## 2021-11-30 DIAGNOSIS — E1122 Type 2 diabetes mellitus with diabetic chronic kidney disease: Secondary | ICD-10-CM

## 2021-11-30 DIAGNOSIS — F419 Anxiety disorder, unspecified: Secondary | ICD-10-CM | POA: Diagnosis not present

## 2021-11-30 DIAGNOSIS — Z23 Encounter for immunization: Secondary | ICD-10-CM

## 2021-11-30 DIAGNOSIS — N184 Chronic kidney disease, stage 4 (severe): Secondary | ICD-10-CM

## 2021-11-30 DIAGNOSIS — E538 Deficiency of other specified B group vitamins: Secondary | ICD-10-CM | POA: Diagnosis not present

## 2021-11-30 DIAGNOSIS — Z8709 Personal history of other diseases of the respiratory system: Secondary | ICD-10-CM | POA: Diagnosis not present

## 2021-11-30 DIAGNOSIS — Z794 Long term (current) use of insulin: Secondary | ICD-10-CM | POA: Diagnosis not present

## 2021-11-30 DIAGNOSIS — R69 Illness, unspecified: Secondary | ICD-10-CM | POA: Diagnosis not present

## 2021-11-30 DIAGNOSIS — F32A Depression, unspecified: Secondary | ICD-10-CM

## 2021-11-30 NOTE — Progress Notes (Signed)
Samantha Hahn is a 61 y.o. female here for an establishment of care.  History of Present Illness:   Chief Complaint  Patient presents with   Establish Care    HPI  She is transferring care due to being fired as a patient from previous provider due to cancellations from caring for her mother and father.  Diabetes Currently seeing Dr. Cruzita Lederer with [facility]. Current DM meds: Basaglar 18 U nightly (titrating up per endo's recommendation), Actos 30 mg daily and 10 U mealtime insulin.. Patient is  compliant with medications. Last endo note reviewed with patient. Denies: hypoglycemic or hyperglycemic episodes or symptoms. This patient's diabetes is complicated by CKD.  Lab Results  Component Value Date   HGBA1C 11.3 (A) 10/25/2021    CKD IV She is awaiting a kidney transplant at [facility]. She sees Dr. Moshe Cipro at [facility] Her father currently has kidney disease and is on dialysis.    Anxiety and Depression Was on lexapro but was not doing well with this She is currently on 225 mg effexor and wellbutrin 300 mg and this was started by Dr. Altamese [place] however her insurance will not pay for her to continue to see him She is overall doing well with this Denies seeing therapist   HLD; CAD Saw cardiologist in the past for [facility] transplant team Taking 81 mg daily Taking lipitor 20 mg Normal echo and stress test   Asthma Used symbicort in the past but no longer needs Rare albuterol use; takes singulair 10 mg nightly   B12 Deficiency Has been doing injections monthly x 1 year Daughter administers these   Past Medical History:  Diagnosis Date   Abnormal vaginal Pap smear    Acne rosacea    Allergy    Anemia    Anxiety    Asthma with COPD with status asthmaticus    Chronic kidney disease    Pt is on transplant list at [facility]   Depression    resolved   Diabetes mellitus    Dysrhythmia    pvc   GERD (gastroesophageal reflux disease)    Hot flashes    Hyperlipidemia     preventative meds per pt    Hypertension     resolved,     dr hochrein   Migraines    PVC (premature ventricular contraction)    Sleep apnea    workup still in process     Social History   Tobacco Use   Smoking status: Never   Smokeless tobacco: Never  Vaping Use   Vaping Use: Never used  Substance Use Topics   Alcohol use: Yes    Comment: once a month mixed drink   Drug use: Never    Past Surgical History:  Procedure Laterality Date   ABDOMINAL HYSTERECTOMY     COLONOSCOPY     POLYPECTOMY     SHOULDER ARTHROSCOPY  06/08/2011   Procedure: ARTHROSCOPY SHOULDER;  Surgeon: Marin Shutter, MD;  Location: [facility];  Service: Orthopedics;  Laterality: Left;  MUA, LOA, SAD, DCR    Family History  Problem Relation Age of Onset   Heart disease Mother 35       CAD   Asthma Mother    Dementia Mother    Stroke Mother    Breast cancer Mother    Diabetes Mother    Heart Problems Father    Diabetes Father    Kidney failure Father    Asthma Sister    Breast cancer Maternal Grandmother  Colon cancer Neg Hx    Esophageal cancer Neg Hx    Rectal cancer Neg Hx    Stomach cancer Neg Hx    Colon polyps Neg Hx     Allergies  Allergen Reactions   Clarithromycin Hives   Vicodin [Hydrocodone-Acetaminophen] Nausea And Vomiting   Exenatide Other (See Comments)    Rash & itching at injection site    Current Medications:   Current Outpatient Medications:    albuterol (VENTOLIN HFA) 108 (90 Base) MCG/ACT inhaler, INHALE TWO PUFFS BY MOUTH EVERY 4 TO 6 HOURS AS NEEDED FOR COUGH OR WHEEZING, Disp: 18 g, Rfl: 1   aspirin EC 81 MG tablet, Take 81 mg by mouth daily. Swallow whole., Disp: , Rfl:    atorvastatin (LIPITOR) 20 MG tablet, TAKE ONE TABLET BY MOUTH DAILY, Disp: 30 tablet, Rfl: 0   buPROPion (WELLBUTRIN XL) 300 MG 24 hr tablet, TAKE ONE TABLET BY MOUTH DAILY, Disp: 90 tablet, Rfl: 2   Calcium-Vitamin D 600-200 MG-UNIT per tablet, Take 1 tablet by mouth daily., Disp: , Rfl:     cetirizine (ZYRTEC) 10 MG tablet, Take 10 mg by mouth daily., Disp: , Rfl:    Cholecalciferol (VITAMIN D3) 125 MCG (5000 UT) CAPS, Take 1 capsule by mouth daily in the afternoon., Disp: , Rfl:    Continuous Blood Gluc Sensor (FREESTYLE LIBRE 2 SENSOR) MISC, 1 Device by Does not apply route every 14 (fourteen) days., Disp: 6 each, Rfl: 3   cyanocobalamin (,VITAMIN B-12,) 1000 MCG/ML injection, INJECT 1 ML INTO THE MUSCLE EVERY 30 DAYS (ONCE MONTHLY), Disp: 1 mL, Rfl: 5   DROPLET PEN NEEDLES 31G X 8 MM MISC, USE AS DIRECTED, Disp: 100 each, Rfl: 2   ferrous fumarate (HEMOCYTE - 106 MG FE) 325 (106 FE) MG TABS, Take 1 tablet by mouth daily., Disp: , Rfl:    insulin aspart (NOVOLOG FLEXPEN) 100 UNIT/ML FlexPen, Inject 10 Units into the skin 3 (three) times daily with meals. And pen needles 1/day, Disp: 30 mL, Rfl: 3   Insulin Glargine (BASAGLAR KWIKPEN) 100 UNIT/ML, Inject 14 Units into the skin at bedtime., Disp: 30 mL, Rfl: 3   loratadine (CLARITIN) 10 MG tablet, Take 10 mg by mouth daily after breakfast. For allergies, Disp: , Rfl:    montelukast (SINGULAIR) 10 MG tablet, Take 1 tablet (10 mg total) by mouth every morning., Disp: 30 tablet, Rfl: 0   multivitamin (THERAGRAN) per tablet, Take 1 tablet by mouth daily., Disp: , Rfl:    pantoprazole (PROTONIX) 40 MG tablet, TAKE ONE TABLET BY MOUTH DAILY, Disp: 90 tablet, Rfl: 2   pioglitazone (ACTOS) 30 MG tablet, Take 1 tablet (30 mg total) by mouth daily., Disp: 90 tablet, Rfl: 3   venlafaxine XR (EFFEXOR-XR) 150 MG 24 hr capsule, Take 150 mg by mouth daily with breakfast., Disp: , Rfl:    venlafaxine XR (EFFEXOR-XR) 75 MG 24 hr capsule, Take 75 mg by mouth daily., Disp: , Rfl:    Review of Systems:   ROS Negative unless otherwise specified per HPI.  Vitals:   Vitals:   11/30/21 1316  BP: 130/80  Pulse: 100  Temp: 98.2 F (36.8 C)  TempSrc: Temporal  SpO2: 99%  Weight: 233 lb (105.7 kg)  Height: '5\' 8"'$  (1.727 m)     Body mass index is  35.43 kg/m.  Physical Exam:   Physical Exam Constitutional:      General: She is not in acute distress.    Appearance: Normal appearance. She  is not ill-appearing.  HENT:     Head: Normocephalic and atraumatic.     Right Ear: External ear normal.     Left Ear: External ear normal.  Eyes:     Extraocular Movements: Extraocular movements intact.     Pupils: Pupils are equal, round, and reactive to light.  Cardiovascular:     Rate and Rhythm: Normal rate and regular rhythm.     Heart sounds: Normal heart sounds. No murmur heard.    No gallop.  Pulmonary:     Effort: Pulmonary effort is normal. No respiratory distress.     Breath sounds: Normal breath sounds. No wheezing or rales.  Skin:    General: Skin is warm and dry.  Neurological:     Mental Status: She is alert and oriented to person, place, and time.  Psychiatric:        Judgment: Judgment normal.     Assessment and Plan:   Type 2 diabetes mellitus with stage 4 chronic kidney disease, with long-term current use of insulin (HCC) Reviewed most recent endo note Follow-up per endo's guidelines  Stage 4 chronic kidney disease ([place]) Reviewed most recent nephrology note Follow-up per renal and [facility]plant recommendations  Anxiety and depression Well controlled Continue Effexor 225 mg daily and Wellbutrin 300 mg daily I discussed with patient that if they develop any SI, to tell someone immediately and seek medical attention. Follow-up in 6 months for CPE  Hyperlipidemia with type 2 diabetes mellitus Well controlled per patient Continue lipitor 20 mg Follow-up in 6 months and we will repeat lipid panel  History of asthma Well controlled Continue prn albuterol and daily singulair  B12 deficiency Stable per patient   I, Luna Glasgow, acting as a Education administrator for [employer], PA.,have documented all relevant documentation on the behalf of Inda Coke, PA,as directed by  Inda Coke, PA while in the  presence of Inda Coke, [place].  I, Inda Coke, [place], have reviewed all documentation for this visit. The documentation on 11/30/21 for the exam, diagnosis, procedures, and orders are all accurate and complete.   Inda Coke, PA-C

## 2021-11-30 NOTE — Patient Instructions (Addendum)
It was great to see you!  Message [place] when you need any refills, or have your pharmacy contact us.  Reschedule your mammogram!  Follow-up in 6 months for a physical exam.

## 2021-12-08 ENCOUNTER — Other Ambulatory Visit: Payer: Self-pay | Admitting: Internal Medicine

## 2021-12-08 DIAGNOSIS — E538 Deficiency of other specified B group vitamins: Secondary | ICD-10-CM

## 2021-12-12 ENCOUNTER — Ambulatory Visit: Payer: 59

## 2022-01-02 ENCOUNTER — Other Ambulatory Visit: Payer: Self-pay | Admitting: *Deleted

## 2022-01-02 MED ORDER — VENLAFAXINE HCL ER 150 MG PO CP24: 150.0000 mg | ORAL_CAPSULE | Freq: Every day | ORAL | 1 refills | Status: DC

## 2022-01-02 MED ORDER — MONTELUKAST SODIUM 10 MG PO TABS: 10.0000 mg | ORAL_TABLET | Freq: Every morning | ORAL | 0 refills | Status: DC

## 2022-01-02 MED ORDER — PANTOPRAZOLE SODIUM 40 MG PO TBEC: 40.0000 mg | DELAYED_RELEASE_TABLET | Freq: Every day | ORAL | 1 refills | Status: DC

## 2022-01-02 MED ORDER — VENLAFAXINE HCL ER 75 MG PO CP24: 75.0000 mg | ORAL_CAPSULE | Freq: Every day | ORAL | 1 refills | Status: DC

## 2022-01-03 ENCOUNTER — Other Ambulatory Visit: Payer: Self-pay | Admitting: Physician Assistant

## 2022-01-03 DIAGNOSIS — Z1231 Encounter for screening mammogram for malignant neoplasm of breast: Secondary | ICD-10-CM

## 2022-01-06 ENCOUNTER — Ambulatory Visit
Admission: RE | Admit: 2022-01-06 | Discharge: 2022-01-06 | Disposition: A | Discharge status: Home / Self Care | Payer: 59 | Source: Ambulatory Visit | Attending: Physician Assistant | Admitting: Physician Assistant

## 2022-01-06 DIAGNOSIS — Z1231 Encounter for screening mammogram for malignant neoplasm of breast: Secondary | ICD-10-CM | POA: Diagnosis not present

## 2022-01-30 ENCOUNTER — Ambulatory Visit: Payer: 59 | Admitting: Internal Medicine

## 2022-01-30 NOTE — Progress Notes (Deleted)
Patient ID: Samantha Hahn, female   DOB: 1960/11/05, 61 y.o.   MRN: [id number]  HPI: Samantha Hahn is a 61 y.o.-year-old female, returning for follow-up for DM2, dx in 2003, insulin-dependent since 2022, uncontrolled, with complications (CKD stage 4). Pt. previously saw Dr. Loanne Drilling, but last visit with me 3 months ago.  Interim history: No increased urination, blurry vision, nausea, chest pain. Before last visit with Dr. Loanne Drilling in 05/2021 she was walking more and sugars improved.  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  Reviewed HbA1c: Lab Results  Component Value Date   HGBA1C 11.3 (A) 10/25/2021   HGBA1C 9.6 (H) 06/16/2021   HGBA1C 12.4 (A) 03/18/2021   HGBA1C 10.8 (A) 11/05/2020   HGBA1C 12.8 (H) 09/21/2020   HGBA1C 7.8 (H) 03/29/2020   HGBA1C 7.6 (A) 12/01/2019   HGBA1C 7.7 (H) 09/09/2019   HGBA1C 6.7 (H) 03/03/2019   HGBA1C 6.6 (H) 09/02/2018   At last visit she was on: - Glipizide XL 2.5 mg before breakfast - Actos 30 mg at night - Novolog 10 units 3x a day, before meals She had a rash with [place]. She had nausea and vomiting with Rybelsus.  We changed to: - Actos 30 mg daily at bedtime - Basaglar 14 units at bedtime - increase by 2 units every 2-4 units every 2 days until sugars in am <140 or until you reach 40 units - NovoLog 6-12 units 15 min before each main meal  Pt checks her sugars 2-3x a day and they are: - am: 233-320, 449 - 2h after b'fast: n/c - before lunch: n/c - 2h after lunch: 59, 80, 150-240 - before dinner: n/c - 2h after dinner: n/c - bedtime: upper 200s - nighttime: n/c Lowest sugar was 59; she has hypoglycemia awareness at 70.  Highest sugar was 449.  Glucometer: Accu-Chek  - + CKD  - stage 4 - on the renal transplant list at [facility], last BUN/creatinine:  Lab Results  Component Value Date   BUN 41 (H) 06/16/2021   BUN 34 (A) 01/26/2021   CREATININE 2.85 (H)  06/16/2021   CREATININE 2.3 (A) 01/26/2021  She is not on ACE inhibitor/ARB.  -+ HL; last set of lipids: Lab Results  Component Value Date   CHOL 161 06/16/2021   HDL 49 (L) 06/16/2021   LDLCALC 85 06/16/2021   TRIG 176 (H) 06/16/2021   CHOLHDL 3.3 06/16/2021  She is on Lipitor 20 mg daily.  - last eye exam was in 2021. No DR reportedly. She has a separate [employer] 2020 eye insurance.  - no numbness and tingling in her feet.  Last foot exam 10/25/2021.  She also has a history of HTN, OSA, anemia, GERD, PVCs, anxiety.  ROS: + see HPI  Past Medical History:  Diagnosis Date   Abnormal vaginal Pap smear    Acne rosacea    Allergy    Anemia    Anxiety    Asthma with COPD with status asthmaticus    Chronic kidney disease    Pt is on transplant list at [facility]   Depression    resolved   Diabetes mellitus    Dysrhythmia    pvc   GERD (gastroesophageal reflux disease)    Hot flashes    Hyperlipidemia    preventative meds per pt    Hypertension     resolved,     dr hochrein   Migraines  PVC (premature ventricular contraction)    Sleep apnea    workup still in process   Past Surgical History:  Procedure Laterality Date   ABDOMINAL HYSTERECTOMY     COLONOSCOPY     POLYPECTOMY     SHOULDER ARTHROSCOPY  06/08/2011   Procedure: ARTHROSCOPY SHOULDER;  Surgeon: Marin Shutter, MD;  Location: [facility];  Service: Orthopedics;  Laterality: Left;  MUA, LOA, SAD, DCR   Social History   Socioeconomic History   Marital status: Divorced    Spouse name: Not on file   Number of children: Not on file   Years of education: Not on file   Highest education level: Not on file  Occupational History   Occupation: PT    Employer: [employer]  Tobacco Use   Smoking status: Never   Smokeless tobacco: Never  Vaping Use   Vaping Use: Never used  Substance and Sexual Activity   Alcohol use: Yes    Comment: once a month mixed drink   Drug use: Never   Sexual activity: Not Currently    Birth  control/protection: Surgical  Other Topics Concern   Not on file  Social History Narrative   Social history: She is divorced.  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.  She has 1 adult daughter.  Does not smoke or consume alcohol.  She is now caring for her elderly parents in their home which is stressful.  However she says it is less stressful now that she is living with them.       Family history: Brother with history of hypertrophic cardiomyopathy.  1 sister in good health.  Mother with history of stroke, heart disease, kidney stones as well as diabetes.  Father with history of diabetes.   Social Determinants of Health   Financial Resource Strain: Not on file  Food Insecurity: Not on file  Transportation Needs: Not on file  Physical Activity: Not on file  Stress: Not on file  Social Connections: Not on file  Intimate Partner Violence: Not on file   Current Outpatient Medications on File Prior to Visit  Medication Sig Dispense Refill   albuterol (VENTOLIN HFA) 108 (90 Base) MCG/ACT inhaler INHALE TWO PUFFS BY MOUTH EVERY 4 TO 6 HOURS AS NEEDED FOR COUGH OR WHEEZING 18 g 1   aspirin EC 81 MG tablet Take 81 mg by mouth daily. Swallow whole.     atorvastatin (LIPITOR) 20 MG tablet TAKE ONE TABLET BY MOUTH DAILY 30 tablet 0   buPROPion (WELLBUTRIN XL) 300 MG 24 hr tablet TAKE ONE TABLET BY MOUTH DAILY 90 tablet 2   Calcium-Vitamin D 600-200 MG-UNIT per tablet Take 1 tablet by mouth daily.     cetirizine (ZYRTEC) 10 MG tablet Take 10 mg by mouth daily.     Cholecalciferol (VITAMIN D3) 125 MCG (5000 UT) CAPS Take 1 capsule by mouth daily in the afternoon.     Continuous Blood Gluc Sensor (FREESTYLE LIBRE 2 SENSOR) MISC 1 Device by Does not apply route every 14 (fourteen) days. 6 each 3   cyanocobalamin (,VITAMIN B-12,) 1000 MCG/ML injection INJECT 1 ML INTO THE MUSCLE EVERY 30 DAYS (ONCE MONTHLY) 1 mL 5   DROPLET PEN NEEDLES 31G X 8 MM MISC USE AS DIRECTED 100 each 2    ferrous fumarate (HEMOCYTE - 106 MG FE) 325 (106 FE) MG TABS Take 1 tablet by mouth daily.     insulin aspart (NOVOLOG FLEXPEN) 100 UNIT/ML FlexPen Inject 10  Units into the skin 3 (three) times daily with meals. And pen needles 1/day 30 mL 3   Insulin Glargine (BASAGLAR KWIKPEN) 100 UNIT/ML Inject 14 Units into the skin at bedtime. 30 mL 3   loratadine (CLARITIN) 10 MG tablet Take 10 mg by mouth daily after breakfast. For allergies     montelukast (SINGULAIR) 10 MG tablet Take 1 tablet (10 mg total) by mouth every morning. 30 tablet 0   multivitamin (THERAGRAN) per tablet Take 1 tablet by mouth daily.     pantoprazole (PROTONIX) 40 MG tablet Take 1 tablet (40 mg total) by mouth daily. 90 tablet 1   pioglitazone (ACTOS) 30 MG tablet Take 1 tablet (30 mg total) by mouth daily. 90 tablet 3   venlafaxine XR (EFFEXOR-XR) 150 MG 24 hr capsule Take 1 capsule (150 mg total) by mouth daily with breakfast. 90 capsule 1   venlafaxine XR (EFFEXOR-XR) 75 MG 24 hr capsule Take 1 capsule (75 mg total) by mouth daily. 90 capsule 1   No current facility-administered medications on file prior to visit.   Allergies  Allergen Reactions   Clarithromycin Hives   Vicodin [Hydrocodone-Acetaminophen] Nausea And Vomiting   Exenatide Other (See Comments)    Rash & itching at injection site   Family History  Problem Relation Age of Onset   Heart disease Mother 26       CAD   Asthma Mother    Dementia Mother    Stroke Mother    Breast cancer Mother    Diabetes Mother    Heart Problems Father    Diabetes Father    Kidney failure Father    Asthma Sister    Breast cancer Maternal Grandmother    Colon cancer Neg Hx    Esophageal cancer Neg Hx    Rectal cancer Neg Hx    Stomach cancer Neg Hx    Colon polyps Neg Hx    PE: There were no vitals taken for this visit. Wt Readings from Last 3 Encounters:  11/30/21 233 lb (105.7 kg)  10/25/21 234 lb 6.4 oz (106.3 kg)  03/18/21 229 lb 3.2 oz (104 kg)    Constitutional: overweight, in NAD Eyes: no exophthalmos ENT: no thyromegaly, no cervical lymphadenopathy Cardiovascular: RRR, No MRG Respiratory: CTA B Musculoskeletal: no deformities Skin: moist, warm, no rashes Neurological: +  tremor with outstretched hands  ASSESSMENT: 1. DM2, insulin-dependent, uncontrolled, with complications - stage 4 CKD  2. HL  PLAN:  1. Patient with longstanding, uncontrolled type 2 diabetes, on oral antidiabetic regimen with TZD and sulfonylurea and also mealtime NovoLog at last visit, with still poor control.  At that time, HbA1c was very high, at 11.3%.  I advised her to stop sulfonylurea and start long-acting insulin.  We continued mealtime insulin.  Time, she mentions that, she did not have as much time for herself as she had before due to being a caregiver for her father and felt that this contributed to her higher blood sugars.   -At last visit, we also discussed about possibly trying a GLP-1 receptor agonist again.  She tried Rybelsus and had nausea and vomiting but she is open to try another formulation at a low dose.   - I suggested to:  Patient Instructions  Please continue: - Actos 30 mg daily at bedtime - Basaglar 14 units at bedtime - increase by 2 units every 2-4 units every 2 days until sugars in am <140 or until you reach 40 units - NovoLog 6-12  units 15 min before each main meal  Please return in 3-4 months.   - we checked her HbA1c: 7%  - advised to check sugars at different times of the day - 1x a day, rotating check times - advised for yearly eye exams >> she is UTD - return to clinic in 3-4 months  2. HL -Reviewed latest lipid panel from 05/2021: All fractions abnormal: Lab Results  Component Value Date   CHOL 161 06/16/2021   HDL 49 (L) 06/16/2021   LDLCALC 85 06/16/2021   TRIG 176 (H) 06/16/2021   CHOLHDL 3.3 06/16/2021  -She is on Lipitor 20 mg daily without side effects  Philemon Kingdom, MD PhD [facility]  Endocrinology

## 2022-03-02 ENCOUNTER — Other Ambulatory Visit: Payer: Self-pay | Admitting: Physician Assistant

## 2022-03-03 DIAGNOSIS — S8012XA Contusion of left lower leg, initial encounter: Secondary | ICD-10-CM | POA: Diagnosis not present

## 2022-03-06 ENCOUNTER — Other Ambulatory Visit: Payer: Self-pay

## 2022-03-06 DIAGNOSIS — E1122 Type 2 diabetes mellitus with diabetic chronic kidney disease: Secondary | ICD-10-CM

## 2022-03-06 MED ORDER — DROPLET PEN NEEDLES 31G X 8 MM MISC: 0 refills | Status: DC

## 2022-03-16 ENCOUNTER — Other Ambulatory Visit: Payer: Self-pay | Admitting: *Deleted

## 2022-03-16 DIAGNOSIS — I10 Essential (primary) hypertension: Secondary | ICD-10-CM | POA: Diagnosis not present

## 2022-03-16 DIAGNOSIS — E669 Obesity, unspecified: Secondary | ICD-10-CM | POA: Diagnosis not present

## 2022-03-16 DIAGNOSIS — N183 Chronic kidney disease, stage 3 unspecified: Secondary | ICD-10-CM | POA: Diagnosis not present

## 2022-03-16 DIAGNOSIS — E119 Type 2 diabetes mellitus without complications: Secondary | ICD-10-CM | POA: Diagnosis not present

## 2022-03-16 MED ORDER — ATORVASTATIN CALCIUM 20 MG PO TABS: 20.0000 mg | ORAL_TABLET | Freq: Every day | ORAL | 2 refills | Status: DC

## 2022-03-24 DIAGNOSIS — B029 Zoster without complications: Secondary | ICD-10-CM | POA: Diagnosis not present

## 2022-03-24 DIAGNOSIS — Z7682 Awaiting organ transplant status: Secondary | ICD-10-CM | POA: Diagnosis not present

## 2022-03-24 DIAGNOSIS — Z01810 Encounter for preprocedural cardiovascular examination: Secondary | ICD-10-CM | POA: Diagnosis not present

## 2022-03-28 ENCOUNTER — Ambulatory Visit: Payer: 59 | Admitting: Internal Medicine

## 2022-04-02 LAB — HM DIABETES EYE EXAM

## 2022-04-10 ENCOUNTER — Other Ambulatory Visit: Payer: Self-pay

## 2022-04-10 ENCOUNTER — Other Ambulatory Visit: Payer: Self-pay | Admitting: Internal Medicine

## 2022-04-10 ENCOUNTER — Other Ambulatory Visit: Payer: Self-pay | Admitting: Physician Assistant

## 2022-04-10 DIAGNOSIS — E538 Deficiency of other specified B group vitamins: Secondary | ICD-10-CM

## 2022-04-10 MED ORDER — FREESTYLE LIBRE 2 SENSOR MISC: 1.0000 | 1 refills | Status: DC

## 2022-04-11 ENCOUNTER — Telehealth: Payer: Self-pay | Admitting: Physician Assistant

## 2022-04-11 NOTE — Telephone Encounter (Signed)
Spoke to pt told her she will need to contact Dr. Renold Genta for refill for Vit B12 since it was prescribed by Dr. Renold Genta. Also we are not following you that and you have not had lab work done recently. Pt verbalized understanding and said she dos not see Dr. Renold Genta anymore and has an up coming appt with [facility]. Told her we can check labs then and go from there. Pt said that is fine.

## 2022-04-11 NOTE — Telephone Encounter (Signed)
Patient requests RX for cyanocobalamin (,VITAMIN B-12,) 1000 MCG/ML injection  be sent to  (Previously prescribed by prior PCP-Dr. Renold Genta)   [place] [id number] Lorina Rabon, [place] Phone: [phone]  Fax: [phone]

## 2022-04-12 ENCOUNTER — Telehealth: Payer: Self-pay | Admitting: *Deleted

## 2022-04-12 MED ORDER — BUPROPION HCL ER (XL) 300 MG PO TB24: 300.0000 mg | ORAL_TABLET | Freq: Every day | ORAL | 0 refills | Status: DC

## 2022-04-12 NOTE — Telephone Encounter (Signed)
Received fax from pharmacy, pt needs refill on Bupropion XL 300 mg one tablet daily. Has not been prescribed by you. Pt no longer sees Dr. Renold Genta. Please advise if okay to fill?

## 2022-04-26 ENCOUNTER — Ambulatory Visit: Payer: Self-pay | Admitting: Family Medicine

## 2022-05-25 ENCOUNTER — Other Ambulatory Visit: Payer: Self-pay | Admitting: Physician Assistant

## 2022-05-30 ENCOUNTER — Encounter: Payer: 59 | Admitting: Physician Assistant

## 2022-05-30 DIAGNOSIS — N183 Chronic kidney disease, stage 3 unspecified: Secondary | ICD-10-CM | POA: Diagnosis not present

## 2022-05-30 DIAGNOSIS — E119 Type 2 diabetes mellitus without complications: Secondary | ICD-10-CM | POA: Diagnosis not present

## 2022-05-30 DIAGNOSIS — E669 Obesity, unspecified: Secondary | ICD-10-CM | POA: Diagnosis not present

## 2022-05-30 DIAGNOSIS — I10 Essential (primary) hypertension: Secondary | ICD-10-CM | POA: Diagnosis not present

## 2022-05-31 DIAGNOSIS — N183 Chronic kidney disease, stage 3 unspecified: Secondary | ICD-10-CM | POA: Diagnosis not present

## 2022-06-01 LAB — LAB REPORT - SCANNED
Creatinine, POC: 73.2 mg/dL
EGFR: 22
Protein/Creatinine Ratio: 206

## 2022-06-07 DIAGNOSIS — M25512 Pain in left shoulder: Secondary | ICD-10-CM | POA: Diagnosis not present

## 2022-06-13 ENCOUNTER — Encounter: Payer: 59 | Admitting: Physician Assistant

## 2022-06-28 ENCOUNTER — Other Ambulatory Visit: Payer: Self-pay | Admitting: Physician Assistant

## 2022-06-28 DIAGNOSIS — M25512 Pain in left shoulder: Secondary | ICD-10-CM | POA: Diagnosis not present

## 2022-06-29 DIAGNOSIS — R000 Tachycardia, unspecified: Secondary | ICD-10-CM | POA: Diagnosis not present

## 2022-07-27 ENCOUNTER — Other Ambulatory Visit: Payer: Self-pay | Admitting: Physician Assistant

## 2022-08-02 ENCOUNTER — Encounter: Payer: Self-pay | Admitting: Internal Medicine

## 2022-08-02 ENCOUNTER — Ambulatory Visit: Payer: 59 | Admitting: Internal Medicine

## 2022-08-02 VITALS — BP 136/80 | HR 102 | Ht 68.0 in | Wt 227.0 lb

## 2022-08-02 DIAGNOSIS — E13641 Other specified diabetes mellitus with hypoglycemia with coma: Secondary | ICD-10-CM

## 2022-08-02 DIAGNOSIS — R112 Nausea with vomiting, unspecified: Secondary | ICD-10-CM | POA: Diagnosis not present

## 2022-08-02 DIAGNOSIS — Z86010 Personal history of colonic polyps: Secondary | ICD-10-CM

## 2022-08-02 DIAGNOSIS — K219 Gastro-esophageal reflux disease without esophagitis: Secondary | ICD-10-CM

## 2022-08-02 NOTE — Patient Instructions (Signed)
You have been scheduled for an endoscopy and colonoscopy. Please follow the written instructions given to you at your visit today. Please pick up your prep supplies at the pharmacy within the next 1-3 days. If you use inhalers (even only as needed), please bring them with you on the day of your procedure.  _______________________________________________________  If your blood pressure at your visit was 140/90 or greater, please contact your primary care physician to follow up on this.  _______________________________________________________  If you are age 63 or older, your body mass index should be between 23-30. Your Body mass index is 34.52 kg/m. If this is out of the aforementioned range listed, please consider follow up with your Primary Care Provider.  If you are age 37 or younger, your body mass index should be between 19-25. Your Body mass index is 34.52 kg/m. If this is out of the aformentioned range listed, please consider follow up with your Primary Care Provider.   ________________________________________________________  The [facility] providers would like to encourage you to use [facility] to communicate with providers for non-urgent requests or questions.  Due to long hold times on the telephone, sending your provider a message by [facility] may be a faster and more efficient way to get a response.  Please allow 48 business hours for a response.  Please remember that this is for non-urgent requests.  _______________________________________________________

## 2022-08-02 NOTE — Progress Notes (Signed)
HISTORY OF PRESENT ILLNESS:  Samantha Hahn is a 62 y.o. female, retired physical therapist at [employer], who presents today regarding surveillance colonoscopy and problems with nausea and vomiting.  She is sent by her transplant evaluation team at [facility].  Patient has chronic renal insufficiency with recent creatinine 2.5-2.8 range.  She is not on dialysis.  She has had longstanding diabetes mellitus.  I last saw the patient in the office January 2019 regarding normocytic anemia, diarrhea, GERD, and history of adenomatous polyps.  She subsequently underwent both colonoscopy and upper endoscopy June 19, 2017.  Colonoscopy revealed a tubular adenoma and sigmoid diverticulosis.  Normal random colon biopsies.  Upper endoscopy revealed erosive gastropathy.  Duodenal biopsies were negative for sprue.  She has continued on pantoprazole 40 mg daily for GERD.  Good control.  She tells me that she has no problems with nausea or vomiting until she was put on Rybelsus.  She was having episodes of vomiting undigested food several times per week.  Rybelsus was stopped several months ago.  The problem significantly improved, though not completely.  Currently she describes 1 episode of vomiting undigested food about every other week.  Hemoglobin A1c in January was 9.4.  REVIEW OF SYSTEMS:  All non-GI ROS negative as otherwise stated in the HPI except for headaches, irregular heartbeat, anxiety, depression, fatigue  Past Medical History:  Diagnosis Date   Abnormal vaginal Pap smear    Acne rosacea    Allergy    Anemia    Anxiety    Asthma with COPD with status asthmaticus    Chronic kidney disease    Pt is on transplant list at [facility]   Depression    resolved   Diabetes mellitus    Dysrhythmia    pvc   GERD (gastroesophageal reflux disease)    Hot flashes    Hyperlipidemia    preventative meds per pt    Hypertension     resolved,     dr hochrein   Migraines    PVC (premature ventricular  contraction)    Sleep apnea    workup still in process    Past Surgical History:  Procedure Laterality Date   ABDOMINAL HYSTERECTOMY     COLONOSCOPY     POLYPECTOMY     SHOULDER ARTHROSCOPY  06/08/2011   Procedure: ARTHROSCOPY SHOULDER;  Surgeon: Senaida Lange, MD;  Location: [facility];  Service: Orthopedics;  Laterality: Left;  MUA, LOA, SAD, DCR    Social History Samantha Hahn  reports that she has never smoked. She has never used smokeless tobacco. She reports that she does not drink alcohol and does not use drugs.  family history includes Asthma in her mother and sister; Breast cancer in her maternal grandmother and mother; Dementia in her mother; Diabetes in her father and mother; Heart Problems in her father; Heart disease (age of onset: 60) in her mother; Kidney failure in her father; Stroke in her mother.  Allergies  Allergen Reactions   Clarithromycin Hives   Vicodin [Hydrocodone-Acetaminophen] Nausea And Vomiting   Exenatide Other (See Comments)    Rash & itching at injection site       PHYSICAL EXAMINATION: Vital signs: BP 136/80   Pulse (!) 102   Ht 5\' 8"  (1.727 m)   Wt 227 lb (103 kg)   BMI 34.52 kg/m   Constitutional: generally well-appearing, no acute distress Psychiatric: alert and oriented x3, cooperative Eyes: extraocular movements intact, anicteric, conjunctiva pink Mouth: oral pharynx  moist, no lesions Neck: supple no lymphadenopathy Cardiovascular: heart regular rate and rhythm, no murmur Lungs: clear to auscultation bilaterally Abdomen: soft, obese, nontender, nondistended, no obvious ascites, no peritoneal signs, normal bowel sounds, no organomegaly.  No succussion splash Rectal: Deferred until colonoscopy Extremities: no clubbing or cyanosis.  Trace lower extremity edema bilaterally Skin: no lesions on visible extremities Neuro: No focal deficits.  Cranial nerves intact  ASSESSMENT:  1.  History of adenomatous colon polyps.  Last colonoscopy  2019.  Due for surveillance 2.  Problems with nausea and vomiting after Rybelsus.  This is a longstanding diabetic.  Almost certainly drug-induced gastroparesis.  Significantly, but incompletely, improved after drug withdrawal.  May be residual drug effects.  Could be diabetic gastroparesis.  Rule out obstructive process. 3.  Multiple medical problems including diabetes and renal insufficiency 4.  GERD  PLAN:  1.  Continue PPI 2.  Reflux precautions 3.  Schedule upper endoscopy to evaluate nausea and vomiting.  The patient is higher than average risk due to her comorbidities and the need to adjust diabetic medications.The nature of the procedure, as well as the risks, benefits, and alternatives were carefully and thoroughly reviewed with the patient. Ample time for discussion and questions allowed. The patient understood, was satisfied, and agreed to proceed. 4.  Schedule surveillance colonoscopy.  The patient is higher than average risk as above.The nature of the procedure, as well as the risks, benefits, and alternatives were carefully and thoroughly reviewed with the patient. Ample time for discussion and questions allowed. The patient understood, was satisfied, and agreed to proceed. 5.  Decrease at bedtime Basaglar insulin from 22 units to 18 units the night prior to procedure. 6.  Hold morning diabetic agents day of the procedure 7.  Schedule appointments for the morning 8.  Ongoing general medical care with PCP and [facility] transplant evaluation team. Total time 60 minutes was spent preparing to see the patient, reviewing the myriad of data, obtaining comprehensive history, performing medically appropriate physical examination, counseling and educating the patient regarding the above listed issues, ordering multiple endoscopic procedures, and documenting clinical information in the health record

## 2022-08-23 ENCOUNTER — Other Ambulatory Visit: Payer: Self-pay | Admitting: Physician Assistant

## 2022-09-15 DIAGNOSIS — Z01818 Encounter for other preprocedural examination: Secondary | ICD-10-CM | POA: Diagnosis not present

## 2022-09-15 DIAGNOSIS — I129 Hypertensive chronic kidney disease with stage 1 through stage 4 chronic kidney disease, or unspecified chronic kidney disease: Secondary | ICD-10-CM | POA: Diagnosis not present

## 2022-09-15 DIAGNOSIS — Z01810 Encounter for preprocedural cardiovascular examination: Secondary | ICD-10-CM | POA: Diagnosis not present

## 2022-09-15 DIAGNOSIS — N189 Chronic kidney disease, unspecified: Secondary | ICD-10-CM | POA: Diagnosis not present

## 2022-09-15 DIAGNOSIS — R9439 Abnormal result of other cardiovascular function study: Secondary | ICD-10-CM | POA: Diagnosis not present

## 2022-09-15 DIAGNOSIS — I259 Chronic ischemic heart disease, unspecified: Secondary | ICD-10-CM | POA: Diagnosis not present

## 2022-09-15 DIAGNOSIS — Z7682 Awaiting organ transplant status: Secondary | ICD-10-CM | POA: Diagnosis not present

## 2022-09-18 ENCOUNTER — Telehealth: Payer: Self-pay | Admitting: Internal Medicine

## 2022-09-18 DIAGNOSIS — E1122 Type 2 diabetes mellitus with diabetic chronic kidney disease: Secondary | ICD-10-CM | POA: Diagnosis not present

## 2022-09-18 DIAGNOSIS — N183 Chronic kidney disease, stage 3 unspecified: Secondary | ICD-10-CM | POA: Diagnosis not present

## 2022-09-18 DIAGNOSIS — I129 Hypertensive chronic kidney disease with stage 1 through stage 4 chronic kidney disease, or unspecified chronic kidney disease: Secondary | ICD-10-CM | POA: Diagnosis not present

## 2022-09-18 DIAGNOSIS — E669 Obesity, unspecified: Secondary | ICD-10-CM | POA: Diagnosis not present

## 2022-09-18 MED ORDER — PLENVU 140 G PO SOLR: 1.0000 | Freq: Once | ORAL | 0 refills | Status: AC

## 2022-09-18 NOTE — Telephone Encounter (Signed)
Inbound call from patient needing prep medication sent into the pharmacy.  Please advise

## 2022-09-18 NOTE — Telephone Encounter (Signed)
Inbound call from patient stating pharmacy or any nearby pharmacies do not have Plenvu prep medication. Requesting a call back regarding prep medication to discuss if there is an alternative that can be called in and to make sure she can proceed with procedure 7/30. Please advise, thank you.

## 2022-09-18 NOTE — Telephone Encounter (Signed)
Plenvu sent to pharmacy 

## 2022-09-18 NOTE — Telephone Encounter (Signed)
Patient called stated they do not have the Plenvu medication please send an alternative.

## 2022-09-18 NOTE — Telephone Encounter (Signed)
Spoke with patient and told her I would  put a Plenvu sample up front to be picked up.  Patient agreed

## 2022-09-19 ENCOUNTER — Ambulatory Visit (AMBULATORY_SURGERY_CENTER): Payer: 59 | Admitting: Internal Medicine

## 2022-09-19 ENCOUNTER — Encounter: Payer: Self-pay | Admitting: Internal Medicine

## 2022-09-19 VITALS — BP 155/85 | HR 76 | Temp 96.8°F | Resp 11 | Ht 68.0 in | Wt 227.0 lb

## 2022-09-19 DIAGNOSIS — K219 Gastro-esophageal reflux disease without esophagitis: Secondary | ICD-10-CM

## 2022-09-19 DIAGNOSIS — D120 Benign neoplasm of cecum: Secondary | ICD-10-CM | POA: Diagnosis not present

## 2022-09-19 DIAGNOSIS — R112 Nausea with vomiting, unspecified: Secondary | ICD-10-CM | POA: Diagnosis not present

## 2022-09-19 DIAGNOSIS — Z86010 Personal history of colonic polyps: Secondary | ICD-10-CM

## 2022-09-19 DIAGNOSIS — K449 Diaphragmatic hernia without obstruction or gangrene: Secondary | ICD-10-CM | POA: Diagnosis not present

## 2022-09-19 DIAGNOSIS — J449 Chronic obstructive pulmonary disease, unspecified: Secondary | ICD-10-CM | POA: Diagnosis not present

## 2022-09-19 DIAGNOSIS — Z09 Encounter for follow-up examination after completed treatment for conditions other than malignant neoplasm: Secondary | ICD-10-CM | POA: Diagnosis not present

## 2022-09-19 DIAGNOSIS — J45909 Unspecified asthma, uncomplicated: Secondary | ICD-10-CM | POA: Diagnosis not present

## 2022-09-19 DIAGNOSIS — D123 Benign neoplasm of transverse colon: Secondary | ICD-10-CM | POA: Diagnosis not present

## 2022-09-19 DIAGNOSIS — Z1211 Encounter for screening for malignant neoplasm of colon: Secondary | ICD-10-CM | POA: Diagnosis not present

## 2022-09-19 DIAGNOSIS — D122 Benign neoplasm of ascending colon: Secondary | ICD-10-CM

## 2022-09-19 DIAGNOSIS — E119 Type 2 diabetes mellitus without complications: Secondary | ICD-10-CM | POA: Diagnosis not present

## 2022-09-19 MED ORDER — SODIUM CHLORIDE 0.9 % IV SOLN: 500.0000 mL | INTRAVENOUS | Status: DC

## 2022-09-19 NOTE — Progress Notes (Signed)
Report to PACU, RN, vss, BBS= Clear.  

## 2022-09-19 NOTE — Progress Notes (Signed)
Patient states there have been no changes to medical or surgical history since time of pre-visit. 

## 2022-09-19 NOTE — Progress Notes (Signed)
Expand All Collapse All HISTORY OF PRESENT ILLNESS:   Samantha Hahn is a 62 y.o. female, retired physical therapist at [employer], who presents today regarding surveillance colonoscopy and problems with nausea and vomiting.  She is sent by her transplant evaluation team at [facility].  Patient has chronic renal insufficiency with recent creatinine 2.5-2.8 range.  She is not on dialysis.  She has had longstanding diabetes mellitus.  I last saw the patient in the office January 2019 regarding normocytic anemia, diarrhea, GERD, and history of adenomatous polyps.  She subsequently underwent both colonoscopy and upper endoscopy June 19, 2017.  Colonoscopy revealed a tubular adenoma and sigmoid diverticulosis.  Normal random colon biopsies.  Upper endoscopy revealed erosive gastropathy.  Duodenal biopsies were negative for sprue.  She has continued on pantoprazole 40 mg daily for GERD.  Good control.  She tells me that she has no problems with nausea or vomiting until she was put on Rybelsus.  She was having episodes of vomiting undigested food several times per week.  Rybelsus was stopped several months ago.  The problem significantly improved, though not completely.  Currently she describes 1 episode of vomiting undigested food about every other week.  Hemoglobin A1c in January was 9.4.   REVIEW OF SYSTEMS:   All non-GI ROS negative as otherwise stated in the HPI except for headaches, irregular heartbeat, anxiety, depression, fatigue       Past Medical History:  Diagnosis Date   Abnormal vaginal Pap smear     Acne rosacea     Allergy     Anemia     Anxiety     Asthma with COPD with status asthmaticus     Chronic kidney disease      Pt is on transplant list at [facility]   Depression      resolved   Diabetes mellitus     Dysrhythmia      pvc   GERD (gastroesophageal reflux disease)     Hot flashes     Hyperlipidemia      preventative meds per pt    Hypertension       resolved,     dr hochrein    Migraines     PVC (premature ventricular contraction)     Sleep apnea      workup still in process               Past Surgical History:  Procedure Laterality Date   ABDOMINAL HYSTERECTOMY       COLONOSCOPY       POLYPECTOMY       SHOULDER ARTHROSCOPY   06/08/2011    Procedure: ARTHROSCOPY SHOULDER;  Surgeon: Senaida Lange, MD;  Location: [facility] OR;  Service: Orthopedics;  Laterality: Left;  MUA, LOA, SAD, DCR          Social History KESHIA HOEK  reports that she has never smoked. She has never used smokeless tobacco. She reports that she does not drink alcohol and does not use drugs.   family history includes Asthma in her mother and sister; Breast cancer in her maternal grandmother and mother; Dementia in her mother; Diabetes in her father and mother; Heart Problems in her father; Heart disease (age of onset: 17) in her mother; Kidney failure in her father; Stroke in her mother.   Allergies       Allergies  Allergen Reactions   Clarithromycin Hives   Vicodin [Hydrocodone-Acetaminophen] Nausea And Vomiting   Exenatide Other (See Comments)  Rash & itching at injection site            PHYSICAL EXAMINATION: Vital signs: BP 136/80   Pulse (!) 102   Ht 5\' 8"  (1.727 m)   Wt 227 lb (103 kg)   BMI 34.52 kg/m   Constitutional: generally well-appearing, no acute distress Psychiatric: alert and oriented x3, cooperative Eyes: extraocular movements intact, anicteric, conjunctiva pink Mouth: oral pharynx moist, no lesions Neck: supple no lymphadenopathy Cardiovascular: heart regular rate and rhythm, no murmur Lungs: clear to auscultation bilaterally Abdomen: soft, obese, nontender, nondistended, no obvious ascites, no peritoneal signs, normal bowel sounds, no organomegaly.  No succussion splash Rectal: Deferred until colonoscopy Extremities: no clubbing or cyanosis.  Trace lower extremity edema bilaterally Skin: no lesions on visible extremities Neuro: No focal  deficits.  Cranial nerves intact   ASSESSMENT:   1.  History of adenomatous colon polyps.  Last colonoscopy 2019.  Due for surveillance 2.  Problems with nausea and vomiting after Rybelsus.  This is a longstanding diabetic.  Almost certainly drug-induced gastroparesis.  Significantly, but incompletely, improved after drug withdrawal.  May be residual drug effects.  Could be diabetic gastroparesis.  Rule out obstructive process. 3.  Multiple medical problems including diabetes and renal insufficiency 4.  GERD   PLAN:   1.  Continue PPI 2.  Reflux precautions 3.  Schedule upper endoscopy to evaluate nausea and vomiting.  The patient is higher than average risk due to her comorbidities and the need to adjust diabetic medications.The nature of the procedure, as well as the risks, benefits, and alternatives were carefully and thoroughly reviewed with the patient. Ample time for discussion and questions allowed. The patient understood, was satisfied, and agreed to proceed. 4.  Schedule surveillance colonoscopy.  The patient is higher than average risk as above.The nature of the procedure, as well as the risks, benefits, and alternatives were carefully and thoroughly reviewed with the patient. Ample time for discussion and questions allowed. The patient understood, was satisfied, and agreed to proceed. 5.  Decrease at bedtime Basaglar insulin from 22 units to 18 units the night prior to procedure. 6.  Hold morning diabetic agents day of the procedure 7.  Schedule appointments for the morning 8.  Ongoing general medical care with PCP and [facility] transplant evaluation team.  No interval change.  Now for colonoscopy and upper endoscopy

## 2022-09-19 NOTE — Op Note (Signed)
[facility] Patient Name: Samantha Hahn Procedure Date: 09/19/2022 9:16 AM MRN: [id number] Endoscopist: Wilhemina Bonito. Marina Goodell , MD, [id number] Age: 62 Referring MD:  Date of Birth: [birth year] Gender: Female Account #: [id number] Procedure:                Colonoscopy with cold snare polypectomy x 2; biopsy                            polypectomy x 1 Indications:              High risk colon cancer surveillance: Personal                            history of non-advanced adenomas. Previous                            examinations 2013, 2019 Medicines:                Monitored Anesthesia Care Procedure:                Pre-Anesthesia Assessment:                           - Prior to the procedure, a History and Physical                            was performed, and patient medications and                            allergies were reviewed. The patient's tolerance of                            previous anesthesia was also reviewed. The risks                            and benefits of the procedure and the sedation                            options and risks were discussed with the patient.                            All questions were answered, and informed consent                            was obtained. Prior Anticoagulants: The patient has                            taken no anticoagulant or antiplatelet agents. ASA                            Grade Assessment: II - A patient with mild systemic                            disease. After reviewing the risks and benefits,  the patient was deemed in satisfactory condition to                            undergo the procedure.                           After obtaining informed consent, the colonoscope                            was passed under direct vision. Throughout the                            procedure, the patient's blood pressure, pulse, and                            oxygen saturations were monitored  continuously. The                            Olympus Scope SN: [id number] was introduced through                            the anus and advanced to the the cecum, identified                            by appendiceal orifice and ileocecal valve. The                            ileocecal valve, appendiceal orifice, and rectum                            were photographed. The quality of the bowel                            preparation was excellent. The colonoscopy was                            performed without difficulty. The patient tolerated                            the procedure well. The bowel preparation used was                            SUPREP via split dose instruction. Scope In: 9:22:32 AM Scope Out: 9:35:53 AM Scope Withdrawal Time: 0 hours 10 minutes 21 seconds  Total Procedure Duration: 0 hours 13 minutes 21 seconds  Findings:                 Two polyps were found in the transverse colon and                            cecum. The polyps were 2 to 5 mm in size. These                            polyps were removed with a  cold snare. Resection                            and retrieval were complete.                           A less than 1 mm polyp was found in the ascending                            colon. The polyp was removed with a jumbo cold                            forceps. Resection and retrieval were complete.                           Multiple diverticula were found in the sigmoid                            colon.                           The exam was otherwise without abnormality on                            direct and retroflexion views. Complications:            No immediate complications. Estimated blood loss:                            None. Estimated Blood Loss:     Estimated blood loss: none. Impression:               - Two 2 to 5 mm polyps in the transverse colon and                            in the cecum, removed with a cold snare. Resected                             and retrieved.                           - One less than 1 mm polyp in the ascending colon,                            removed with a jumbo cold forceps. Resected and                            retrieved.                           - Diverticulosis in the sigmoid colon.                           - The examination was otherwise normal on direct  and retroflexion views. Recommendation:           - Repeat colonoscopy in 5 years for surveillance.                           - Patient has a contact number available for                            emergencies. The signs and symptoms of potential                            delayed complications were discussed with the                            patient. Return to normal activities tomorrow.                            Written discharge instructions were provided to the                            patient.                           - Resume previous diet.                           - Continue present medications.                           - Await pathology results. Wilhemina Bonito. Marina Goodell, MD 09/19/2022 9:42:30 AM This report has been signed electronically.

## 2022-09-19 NOTE — Progress Notes (Signed)
Called to room to assist during endoscopic procedure.  Patient ID and intended procedure confirmed with present staff. Received instructions for my participation in the procedure from the performing physician.  

## 2022-09-19 NOTE — Patient Instructions (Signed)
Discharge instructions given. Handouts on polyps,diverticulosis,Hiatal Hernia and Genella Rife,. Resume previous medications. YOU HAD AN ENDOSCOPIC PROCEDURE TODAY AT THE [facility]:   Refer to the procedure report that was given to you for any specific questions about what was found during the examination.  If the procedure report does not answer your questions, please call your gastroenterologist to clarify.  If you requested that your care partner not be given the details of your procedure findings, then the procedure report has been included in a sealed envelope for you to review at your convenience later.  YOU SHOULD EXPECT: Some feelings of bloating in the abdomen. Passage of more gas than usual.  Walking can help get rid of the air that was put into your GI tract during the procedure and reduce the bloating. If you had a lower endoscopy (such as a colonoscopy or flexible sigmoidoscopy) you may notice spotting of blood in your stool or on the toilet paper. If you underwent a bowel prep for your procedure, you may not have a normal bowel movement for a few days.  Please Note:  You might notice some irritation and congestion in your nose or some drainage.  This is from the oxygen used during your procedure.  There is no need for concern and it should clear up in a day or so.  SYMPTOMS TO REPORT IMMEDIATELY:  Following lower endoscopy (colonoscopy or flexible sigmoidoscopy):  Excessive amounts of blood in the stool  Significant tenderness or worsening of abdominal pains  Swelling of the abdomen that is new, acute  Fever of 100F or higher  Following upper endoscopy (EGD)  Vomiting of blood or coffee ground material  New chest pain or pain under the shoulder blades  Painful or persistently difficult swallowing  New shortness of breath  Fever of 100F or higher  Black, tarry-looking stools  For urgent or emergent issues, a gastroenterologist can be reached at any hour by calling [phone]  [phone]. Do not use MyChart messaging for urgent concerns.    DIET:  We do recommend a small meal at first, but then you may proceed to your regular diet.  Drink plenty of fluids but you should avoid alcoholic beverages for 24 hours.  ACTIVITY:  You should plan to take it easy for the rest of today and you should NOT DRIVE or use heavy machinery until tomorrow (because of the sedation medicines used during the test).    FOLLOW UP: Our staff will call the number listed on your records the next business day following your procedure.  We will call around 7:15- 8:00 am to check on you and address any questions or concerns that you may have regarding the information given to you following your procedure. If we do not reach you, we will leave a message.     If any biopsies were taken you will be contacted by phone or by letter within the next 1-3 weeks.  Please call us at [phone] if you have not heard about the biopsies in 3 weeks.    SIGNATURES/CONFIDENTIALITY: You and/or your care partner have signed paperwork which will be entered into your electronic medical record.  These signatures attest to the fact that that the information above on your After Visit Summary has been reviewed and is understood.  Full responsibility of the confidentiality of this discharge information lies with you and/or your care-partner.

## 2022-09-19 NOTE — Op Note (Signed)
[facility] Patient Name: Samantha Hahn Procedure Date: 09/19/2022 9:10 AM MRN: [id number] Endoscopist: Wilhemina Bonito. Marina Goodell , MD, [id number] Age: 62 Referring MD:  Date of Birth: 31-Mar-1960 Gender: Female Account #: [id number] Procedure:                Upper GI endoscopy Indications:              Esophageal reflux, Nausea with vomiting Medicines:                Monitored Anesthesia Care Procedure:                Pre-Anesthesia Assessment:                           - Prior to the procedure, a History and Physical                            was performed, and patient medications and                            allergies were reviewed. The patient's tolerance of                            previous anesthesia was also reviewed. The risks                            and benefits of the procedure and the sedation                            options and risks were discussed with the patient.                            All questions were answered, and informed consent                            was obtained. Prior Anticoagulants: The patient has                            taken no anticoagulant or antiplatelet agents. ASA                            Grade Assessment: II - A patient with mild systemic                            disease. After reviewing the risks and benefits,                            the patient was deemed in satisfactory condition to                            undergo the procedure.                           After obtaining informed consent, the endoscope was  passed under direct vision. Throughout the                            procedure, the patient's blood pressure, pulse, and                            oxygen saturations were monitored continuously. The                            Olympus scope [id number] was introduced through the                            mouth, and advanced to the second part of duodenum.                            The upper GI  endoscopy was accomplished without                            difficulty. The patient tolerated the procedure                            well. Scope In: Scope Out: Findings:                 The esophagus was normal.                           The stomach was normal. Hiatal hernia present.                           The examined duodenum was normal.                           The cardia and gastric fundus were normal on                            retroflexion. Complications:            No immediate complications. Estimated Blood Loss:     Estimated blood loss: none. Impression:               1. Normal EGD                           2. Small hiatal hernia                           3. GERD. Recommendation:           - Patient has a contact number available for                            emergencies. The signs and symptoms of potential                            delayed complications were discussed with the  patient. Return to normal activities tomorrow.                            Written discharge instructions were provided to the                            patient.                           - Resume previous diet.                           - Continue present medications. Wilhemina Bonito. Marina Goodell, MD 09/19/2022 9:50:27 AM This report has been signed electronically.

## 2022-09-20 ENCOUNTER — Telehealth: Payer: Self-pay

## 2022-09-20 NOTE — Telephone Encounter (Signed)
  Follow up Call-     09/19/2022    8:14 AM  Call back number  Post procedure Call Back phone  # [phone]  Permission to leave phone message Yes     Patient questions:  Do you have a fever, pain , or abdominal swelling? No. Pain Score  0 *  Have you tolerated food without any problems? Yes.    Have you been able to return to your normal activities? Yes.    Do you have any questions about your discharge instructions: Diet   No. Medications  No. Follow up visit  No.  Do you have questions or concerns about your Care? No.  Actions: * If pain score is 4 or above: No action needed, pain <4.

## 2022-09-21 ENCOUNTER — Telehealth: Payer: Self-pay | Admitting: Physician Assistant

## 2022-09-21 NOTE — Telephone Encounter (Signed)
Pt called requesting she scheduled a new pt appt at our office. Pt is currently under the care of Jarold Motto, [place] @ [facility]. Pt states since she lives in [place], [place] is a better commute for her. Is this toc okay? Call back # [phone]

## 2022-09-21 NOTE — Telephone Encounter (Signed)
Yes, happy to accept her as she lives locally.

## 2022-09-22 ENCOUNTER — Encounter: Payer: Self-pay | Admitting: Internal Medicine

## 2022-09-22 NOTE — Telephone Encounter (Signed)
Patient scheduled.

## 2022-09-28 ENCOUNTER — Encounter: Payer: 59 | Admitting: Primary Care

## 2022-09-29 ENCOUNTER — Encounter: Payer: Self-pay | Admitting: Physician Assistant

## 2022-10-12 ENCOUNTER — Encounter: Payer: Self-pay | Admitting: Internal Medicine

## 2022-10-12 ENCOUNTER — Ambulatory Visit: Payer: 59 | Admitting: Internal Medicine

## 2022-10-12 VITALS — BP 138/86 | HR 98 | Ht 68.0 in | Wt 228.2 lb

## 2022-10-12 DIAGNOSIS — N184 Chronic kidney disease, stage 4 (severe): Secondary | ICD-10-CM | POA: Diagnosis not present

## 2022-10-12 DIAGNOSIS — Z794 Long term (current) use of insulin: Secondary | ICD-10-CM

## 2022-10-12 DIAGNOSIS — E785 Hyperlipidemia, unspecified: Secondary | ICD-10-CM

## 2022-10-12 DIAGNOSIS — Z7984 Long term (current) use of oral hypoglycemic drugs: Secondary | ICD-10-CM

## 2022-10-12 DIAGNOSIS — E1122 Type 2 diabetes mellitus with diabetic chronic kidney disease: Secondary | ICD-10-CM

## 2022-10-12 DIAGNOSIS — E119 Type 2 diabetes mellitus without complications: Secondary | ICD-10-CM

## 2022-10-12 MED ORDER — NOVOLOG FLEXPEN 100 UNIT/ML ~~LOC~~ SOPN: 8.0000 [IU] | PEN_INJECTOR | Freq: Three times a day (TID) | SUBCUTANEOUS | 3 refills | Status: DC

## 2022-10-12 NOTE — Patient Instructions (Addendum)
Please continue: - Actos 30 mg daily at bedtime  Increase: - Basaglar 26 units at bedtime  - NovoLog 8-16 units 15 min before each main meal  Please return in 3-4 months.

## 2022-10-12 NOTE — Progress Notes (Signed)
Patient ID: Samantha Hahn, female   DOB: 17-Jul-1960, 62 y.o.   MRN: [id number]  HPI: Samantha Hahn is a 62 y.o.-year-old female, returning for follow-up for DM2, dx in 2003, insulin-dependent since 2022, uncontrolled, with complications (CKD stage 4). Pt. previously saw Dr. Everardo All, but last visit with me almost 1 year ago.  She returns after almost a year.  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 He passed away 2022/03/25 nd her stress level is better. She moved into a new house. Diet is better but still eats at irregular times.   Interim history: No increased urination, blurry vision, nausea, chest pain. Lost 6 lbs since last visit.  Reviewed HbA1c:  Lab Results  Component Value Date   HGBA1C 11.3 (A) 10/25/2021   HGBA1C 9.6 (H) 06/16/2021   HGBA1C 12.4 (A) 03/18/2021   HGBA1C 10.8 (A) 11/05/2020   HGBA1C 12.8 (H) 09/21/2020   HGBA1C 7.8 (H) 03/29/2020   HGBA1C 7.6 (A) 12/01/2019   HGBA1C 7.7 (H) 09/09/2019   HGBA1C 6.7 (H) 03/03/2019   HGBA1C 6.6 (H) 09/02/2018   Pt is on a regimen of: - Actos 30 mg at night - Novolog 6-10 >> 14 units 3x a day, before meals (not 15 min before) - Basaglar 14 units-added 10/2021 >> 22 units at night She had a rash with [place]. She had nausea and vomiting with Rybelsus. We stopped glipizide XL 2.5 mg 10/2021  Pt checks her sugars 4x a day and they are:  Prev.: - am: 233-320, 449 - 2h after b'fast: n/c - before lunch: n/c - 2h after lunch: 59, 80, 150-240 - before dinner: n/c - 2h after dinner: n/c - bedtime: upper 200s - nighttime: n/c Lowest sugar was 59; she has hypoglycemia awareness at 70.  Highest sugar was 449.  Glucometer: Accu-Chek  - + CKD  - stage 4 - on the renal transplant list at [facility], last BUN/creatinine:   Lab Results  Component Value Date   BUN 41 (H) 06/16/2021   BUN 34 (A) 01/26/2021   CREATININE 2.85 (H) 06/16/2021   CREATININE 2.3 (A) 01/26/2021     Lab Results  Component Value Date   MICRALBCREAT 12 09/11/2019   MICRALBCREAT 22 09/02/2018   MICRALBCREAT 11 03/08/2018   MICRALBCREAT 24 09/28/2016   MICRALBCREAT CANCELED 09/19/2016   MICRALBCREAT 7 03/30/2016   MICRALBCREAT 17 09/20/2015   MICRALBCREAT 19.9 06/30/2014  She is not on ACE inhibitor/ARB.  -+ HL; last set of lipids: Lab Results  Component Value Date   CHOL 161 06/16/2021   HDL 49 (L) 06/16/2021   LDLCALC 85 06/16/2021   TRIG 176 (H) 06/16/2021   CHOLHDL 3.3 06/16/2021  She is on Lipitor 20 mg daily.  - last eye exam was in 01/2022: No DR reportedly. She has a separate [employer] 2020 eye insurance. Dr Dorcas Mcmurray.  - no numbness and tingling in her feet.  Last foot exam 10/25/2021.  She also has a history of HTN, OSA, anemia, GERD, PVCs, anxiety.  ROS: + see HPI  Past Medical History:  Diagnosis Date   Abnormal vaginal Pap smear    Acne rosacea    Allergy    Anemia    Anxiety    Asthma with COPD with status asthmaticus    Chronic kidney disease    Pt is on transplant list at [facility]   Depression    resolved   Diabetes mellitus  Dysrhythmia    pvc   GERD (gastroesophageal reflux disease)    Hot flashes    Hyperlipidemia    preventative meds per pt    Hypertension     resolved,     dr hochrein   Migraines    PVC (premature ventricular contraction)    Sleep apnea    workup still in process   Past Surgical History:  Procedure Laterality Date   ABDOMINAL HYSTERECTOMY     COLONOSCOPY     POLYPECTOMY     SHOULDER ARTHROSCOPY  06/08/2011   Procedure: ARTHROSCOPY SHOULDER;  Surgeon: Senaida Lange, MD;  Location: [facility] OR;  Service: Orthopedics;  Laterality: Left;  MUA, LOA, SAD, DCR   Social History   Socioeconomic History   Marital status: Divorced    Spouse name: Not on file   Number of children: 1   Years of education: Not on file   Highest education level: Not on file  Occupational History   Occupation: PT    Employer:     Occupation: retired  Tobacco Use   Smoking status: Never   Smokeless tobacco: Never  Vaping Use   Vaping status: Never Used  Substance and Sexual Activity   Alcohol use: Never    Comment: twice a year mixed drink   Drug use: Never   Sexual activity: Not Currently    Birth control/protection: Surgical  Other Topics Concern   Not on file  Social History Narrative   Social history: She is divorced.  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  Does not smoke or consume alcohol.  She is now caring for her elderly parents in their home which is stressful.  However she says it is less stressful now that she is living with them.       Family history: Brother with history of hypertrophic cardiomyopathy.  1 sister in good health.  Mother with history of stroke, heart disease, kidney stones as well as diabetes.  Father with history of diabetes.   Social Determinants of Health   Financial Resource Strain: Not on file  Food Insecurity: Not on file  Transportation Needs: Not on file  Physical Activity: Not on file  Stress: Not on file  Social Connections: Not on file  Intimate Partner Violence: Not on file   Current Outpatient Medications on File Prior to Visit  Medication Sig Dispense Refill   albuterol (VENTOLIN HFA) 108 (90 Base) MCG/ACT inhaler INHALE TWO PUFFS BY MOUTH EVERY 4 TO 6 HOURS AS NEEDED FOR COUGH OR WHEEZING 18 g 1   aspirin EC 81 MG tablet Take 81 mg by mouth daily. Swallow whole.     atorvastatin (LIPITOR) 20 MG tablet TAKE 1 TABLET BY MOUTH DAILY 30 tablet 2   buPROPion (WELLBUTRIN XL) 300 MG 24 hr tablet TAKE 1 TABLET BY MOUTH DAILY 30 tablet 2   Calcium-Vitamin D 600-200 MG-UNIT per tablet Take 1 tablet by mouth daily.     cetirizine (ZYRTEC) 10 MG tablet Take 10 mg by mouth daily.     Cholecalciferol (VITAMIN D3) 125 MCG (5000 UT) CAPS Take 1 capsule by mouth daily in the afternoon.     Continuous Blood Gluc Sensor  (FREESTYLE LIBRE 2 SENSOR) MISC 1 Device by Does not apply route every 14 (fourteen) days. 6 each 1   cyanocobalamin (,VITAMIN B-12,) 1000 MCG/ML injection INJECT 1 ML INTO THE MUSCLE EVERY 30 DAYS (ONCE MONTHLY) (Patient not  taking: Reported on 09/19/2022) 1 mL 5   cyanocobalamin 1000 MCG tablet Take 1,000 mcg by mouth daily.     ferrous fumarate (HEMOCYTE - 106 MG FE) 325 (106 FE) MG TABS Take 1 tablet by mouth daily.     insulin aspart (NOVOLOG FLEXPEN) 100 UNIT/ML FlexPen Inject 10 Units into the skin 3 (three) times daily with meals. And pen needles 1/day 30 mL 3   Insulin Glargine (BASAGLAR KWIKPEN) 100 UNIT/ML Inject 14 Units into the skin at bedtime. 30 mL 3   Insulin Pen Needle (DROPLET PEN NEEDLES) 31G X 8 MM MISC USE AS DIRECTED 30 each 0   loratadine (CLARITIN) 10 MG tablet Take 10 mg by mouth daily after breakfast. For allergies     montelukast (SINGULAIR) 10 MG tablet TAKE 1 TABLET BY MOUTH EVERY MORNING 30 tablet 5   multivitamin (THERAGRAN) per tablet Take 1 tablet by mouth daily.     pantoprazole (PROTONIX) 40 MG tablet TAKE 1 TABLET BY MOUTH DAILY 90 tablet 1   pioglitazone (ACTOS) 30 MG tablet Take 1 tablet (30 mg total) by mouth daily. 90 tablet 3   venlafaxine XR (EFFEXOR-XR) 150 MG 24 hr capsule TAKE 1 CAPSULE BY MOUTH DAILY WITH BREAKFAST 30 capsule 0   venlafaxine XR (EFFEXOR-XR) 75 MG 24 hr capsule Take 1 capsule (75 mg total) by mouth daily. 90 capsule 1   No current facility-administered medications on file prior to visit.   Allergies  Allergen Reactions   Clarithromycin Hives   Vicodin [Hydrocodone-Acetaminophen] Nausea And Vomiting   Exenatide Other (See Comments)    Rash & itching at injection site   Family History  Problem Relation Age of Onset   Heart disease Mother 59       CAD   Asthma Mother    Dementia Mother    Stroke Mother    Breast cancer Mother    Diabetes Mother    Heart Problems Father    Diabetes Father    Kidney failure Father    Asthma  Sister    Breast cancer Maternal Grandmother    Colon cancer Neg Hx    Esophageal cancer Neg Hx    Rectal cancer Neg Hx    Stomach cancer Neg Hx    Colon polyps Neg Hx    PE: BP 138/86   Pulse 98   Ht 5\' 8"  (1.727 m)   Wt 228 lb 3.2 oz (103.5 kg)   SpO2 99%   BMI 34.70 kg/m  Wt Readings from Last 3 Encounters:  10/12/22 228 lb 3.2 oz (103.5 kg)  09/19/22 227 lb (103 kg)  08/02/22 227 lb (103 kg)   Constitutional: overweight, in NAD Eyes: no exophthalmos ENT:  no thyromegaly, no cervical lymphadenopathy Cardiovascular: tachycardia, RR, No MRG Respiratory: CTA B Musculoskeletal: no deformities Skin: moist, warm, no rashes Neurological: +  tremor with outstretched hands Diabetic Foot Exam - Simple   Simple Foot Form Diabetic Foot exam was performed with the following findings: Yes 10/12/2022  9:30 AM  Visual Inspection No deformities, no ulcerations, no other skin breakdown bilaterally: Yes Sensation Testing Intact to touch and monofilament testing bilaterally: Yes Pulse Check Posterior Tibialis and Dorsalis pulse intact bilaterally: Yes Comments Thick L halluceal nail    ASSESSMENT: 1. DM2, insulin-dependent, uncontrolled, with complications - stage 4 CKD  2. HL  PLAN:  1. Patient with longstanding, uncontrolled, type 2 diabetes, on oral antidiabetic regimen with TZD and also basal/bolus insulin regimen, with long-acting insulin added at  last visit.  At that time, HbA1c was higher, 11.3%.  She was very busy being a caregiver for her father and did not have time to take care of her diabetes.  Sugars were very high in the morning in the 200s to 400s range but lower rate during the day.  At that time I advised her to stop glipizide ER, start Basaglar at a lower dose and increase as needed and adjust the doses of NovoLog based on the size of her meals.  We also discussed about possibly trying a GLP-1 receptor agonist.  She tried Rybelsus in the past and had nausea and  vomiting but was open to try another formulation at a lower dose.  However, we discussed about first breaking the sugars lower and then adding Ozempic possibly.  Unfortunately, she was lost for follow-up after last visit and now returns after almost a year.  Her latest HbA1c was much better 3 weeks ago, at 7.8%. -Since last visit, she has less stressed and more time to focus on herself.  She improved her diet although she mentions that she would like to start eating at more regular times.  She was able to obtain a CGM CGM interpretation: -At today's visit, we reviewed her CGM downloads: It appears that 66% of values are in target range (goal >70%), while 30% are higher than 180 (goal <25%), and 4% are lower than 70 (goal <4%).  The calculated average blood sugar is 166.  The projected HbA1c for the next 3 months (GMI) is around 7.4%. -Reviewing the CGM trends, sugars are improved, but fluctuating around the upper limit of the target range with a significant hyperglycemic peak after a late lunch, but with occasional low blood sugars after each of her meals.  Upon questioning, she is not varying the dose of NovoLog with the size of the meal, but only taking 14 units before each meal.  We discussed about the need to change the dose based on the size and consistency of her meals.  I refilled her NovoLog for 8-16 units 3x a day.  Also, she is taking NovoLog at the start of the meal and we discussed about the need to inject this 15 minutes before.  She will start doing so.  We also increased her Basaglar slightly and continued the same dose of Actos. - I suggested to:  Patient Instructions  Please continue: - Actos 30 mg daily at bedtime  Increase: - Basaglar 26 units at bedtime  - NovoLog 8-16 units 15 min before each main meal  Please return in 3-4 months.   - advised to check sugars at different times of the day - 4x a day, rotating check times - advised for yearly eye exams >> she is UTD - return to  clinic in 3-4 months  2. HL -Reviewed latest lipid panel from 05/2021: LDL above our target of less than 70, triglycerides slightly high, HDL slightly low: Lab Results  Component Value Date   CHOL 161 06/16/2021   HDL 49 (L) 06/16/2021   LDLCALC 85 06/16/2021   TRIG 176 (H) 06/16/2021   CHOLHDL 3.3 06/16/2021  -She is on Lipitor 10 mg daily without side effects -She is due for another lipid panel -she has a visit with PCP coming up  Carlus Pavlov, MD PhD [facility]

## 2022-10-27 ENCOUNTER — Other Ambulatory Visit: Payer: Self-pay | Admitting: Internal Medicine

## 2022-11-02 DIAGNOSIS — E782 Mixed hyperlipidemia: Secondary | ICD-10-CM | POA: Diagnosis not present

## 2022-11-02 DIAGNOSIS — R000 Tachycardia, unspecified: Secondary | ICD-10-CM | POA: Diagnosis not present

## 2022-11-02 DIAGNOSIS — R9439 Abnormal result of other cardiovascular function study: Secondary | ICD-10-CM | POA: Diagnosis not present

## 2022-11-23 DIAGNOSIS — H903 Sensorineural hearing loss, bilateral: Secondary | ICD-10-CM | POA: Diagnosis not present

## 2022-11-27 ENCOUNTER — Ambulatory Visit: Payer: 59 | Admitting: Internal Medicine

## 2022-11-29 DIAGNOSIS — H903 Sensorineural hearing loss, bilateral: Secondary | ICD-10-CM | POA: Diagnosis not present

## 2022-12-19 DIAGNOSIS — E1122 Type 2 diabetes mellitus with diabetic chronic kidney disease: Secondary | ICD-10-CM | POA: Diagnosis not present

## 2022-12-19 DIAGNOSIS — N183 Chronic kidney disease, stage 3 unspecified: Secondary | ICD-10-CM | POA: Diagnosis not present

## 2022-12-19 DIAGNOSIS — I129 Hypertensive chronic kidney disease with stage 1 through stage 4 chronic kidney disease, or unspecified chronic kidney disease: Secondary | ICD-10-CM | POA: Diagnosis not present

## 2022-12-19 DIAGNOSIS — E669 Obesity, unspecified: Secondary | ICD-10-CM | POA: Diagnosis not present

## 2022-12-20 LAB — LAB REPORT - SCANNED: EGFR: 21

## 2022-12-26 ENCOUNTER — Encounter: Payer: Self-pay | Admitting: Primary Care

## 2022-12-26 ENCOUNTER — Ambulatory Visit: Payer: 59 | Admitting: Primary Care

## 2022-12-26 VITALS — BP 130/76 | HR 102 | Temp 97.8°F | Ht 68.0 in | Wt 226.6 lb

## 2022-12-26 DIAGNOSIS — E538 Deficiency of other specified B group vitamins: Secondary | ICD-10-CM | POA: Diagnosis not present

## 2022-12-26 DIAGNOSIS — N184 Chronic kidney disease, stage 4 (severe): Secondary | ICD-10-CM | POA: Diagnosis not present

## 2022-12-26 DIAGNOSIS — K219 Gastro-esophageal reflux disease without esophagitis: Secondary | ICD-10-CM

## 2022-12-26 DIAGNOSIS — E785 Hyperlipidemia, unspecified: Secondary | ICD-10-CM

## 2022-12-26 DIAGNOSIS — E1122 Type 2 diabetes mellitus with diabetic chronic kidney disease: Secondary | ICD-10-CM | POA: Diagnosis not present

## 2022-12-26 DIAGNOSIS — F3170 Bipolar disorder, currently in remission, most recent episode unspecified: Secondary | ICD-10-CM

## 2022-12-26 DIAGNOSIS — Z794 Long term (current) use of insulin: Secondary | ICD-10-CM | POA: Diagnosis not present

## 2022-12-26 DIAGNOSIS — J4520 Mild intermittent asthma, uncomplicated: Secondary | ICD-10-CM

## 2022-12-26 DIAGNOSIS — I10 Essential (primary) hypertension: Secondary | ICD-10-CM

## 2022-12-26 DIAGNOSIS — I493 Ventricular premature depolarization: Secondary | ICD-10-CM

## 2022-12-26 DIAGNOSIS — G4733 Obstructive sleep apnea (adult) (pediatric): Secondary | ICD-10-CM

## 2022-12-26 LAB — POCT GLYCOSYLATED HEMOGLOBIN (HGB A1C): Hemoglobin A1C: 7.2 % — AB (ref 4.0–5.6)

## 2022-12-26 MED ORDER — PANTOPRAZOLE SODIUM 20 MG PO TBEC
20.0000 mg | DELAYED_RELEASE_TABLET | Freq: Every day | ORAL | 0 refills | Status: DC
Start: 2022-12-26 — End: 2023-08-21

## 2022-12-26 NOTE — Assessment & Plan Note (Signed)
Following with nephrology, office notes reviewed from August 2024 through media tab  Continue Jardiance 10 mg daily. Continue diabetes and blood pressure control.

## 2022-12-26 NOTE — Assessment & Plan Note (Signed)
Controlled.  Remain off treatment for now.

## 2022-12-26 NOTE — Assessment & Plan Note (Signed)
Following with endocrinology, office notes reviewed from August 2024. A1c today of 7.2!  Continue Jardiance 10 mg daily, Basaglar 26 units daily, NovoLog 8 to 16 units 3 times daily with meals. Remain off of Actos for now per nephrology.

## 2022-12-26 NOTE — Assessment & Plan Note (Signed)
No longer following with psychiatry.  Continue venlafaxine ER to 25 mg daily, bupropion XL 300 mg daily.  Encouraged therapy/grief counseling through [facility].

## 2022-12-26 NOTE — Assessment & Plan Note (Signed)
Intermittent. Following with cardiology.  Remain off of metoprolol for now given CKD.

## 2022-12-26 NOTE — Assessment & Plan Note (Signed)
Untreated.  Could be contributing to symptoms of daytime tiredness/fatigue. Referral placed to pulmonology for further evaluation and treatment.

## 2022-12-26 NOTE — Progress Notes (Signed)
Subjective:    Patient ID: Samantha Hahn, female    DOB: [birth year], 62 y.o.   MRN: [id number]  HPI  Samantha Hahn is a very pleasant 62 y.o. female with a history of migraines, hypertension, asthma, OSA, type 2 diabetes, CKD, depression, bipolar disorder, vitamin B12 deficiency, hyperlipidemia who presents today to transfer care.  1) Hypertension/Hyperlipidemia/OSA: Currently managed on atorvastatin 20 mg daily, and aspirin 81 mg daily.  Follows with cardiology, last office visit was in September 2024.  She underwent her most recent stress test in July 2024 which showed evidence of ischemia in the LCx, normal systolic function.  No change compared to 2022.  Her last lipid panel was collected in September 2024 which showed LDL of 86.  She denies chest pain, shortness of breath.   She has mild to moderate sleep apnea, presented to her dentist for a mouth guard over 1 year ago, never heard back.  She does experience daytime tiredness and fatigue.  Finds it difficult to get out of bed some days.  BP Readings from Last 3 Encounters:  12/26/22 130/76  10/12/22 138/86  09/19/22 (!) 155/85     2) Type 2 Diabetes: Currently managed on Basaglar 26 units daily, NovoLog 8 to 16 units 3 times daily with meals, pioglitazone 30 mg daily.  Follows with endocrinology, last office visit was in August 2024. A1C of 7.8 in July 2024. Currently managed on Jardiance 10 mg per nephrology, holding actos for now.   3) CKD IV: Following with nephrology and transplant team for both kidneys. More recently initiated on Jardiance 10 mg per nephrology.  She is not active on the transplant list as she does not need transplantation at this time.  She does attend her annual follow-up visits.  4) Bipolar Depression: Currently managed on venlafaxine ER 225 mg daily, bupropion XL 300 mg daily for which she's been taking for >1 year.   Previously managed on Zoloft in the past "which made me feel like a zombie". Also  managed on Lexapro in the past which was ineffective.   She is lost both of her parents within the last 2 years, more recently her father in January 2024.  She was their primary caregiver.  She now lives alone.  She does have connections with [facility], free therapy/grief sessions.   She does not see psychiatry. Overall, she doesn't feel like her current regimen is working due to little interest in getting out of bed.  5) GERD: Currently managed on pantoprazole 40 mg daily for which she's been taking years. She has not tried a lower dose.  She denies active esophageal reflux symptoms.  6) Asthma: Diagnosed in 30s. Currently managed on albuterol inhaler for which she uses seasonally as needed. Also managed on Singulair 10 mg HS. feels well-managed on this regimen.   7) Vitamin B12 Deficiency: Currently managed on vitamin B12 1000 mcg daily. Previously managed on 1000 mcg injections for which she took once monthly, felt better on this regimen.  Her daughter was trained to provide her with injections previously.  Review of Systems  Constitutional:  Positive for fatigue.  Respiratory:  Negative for shortness of breath.   Cardiovascular:  Negative for chest pain.  Gastrointestinal:  Negative for constipation and diarrhea.  Skin:  Negative for color change.  Neurological:  Negative for headaches.  Psychiatric/Behavioral:  Negative for sleep disturbance.          Past Medical History:  Diagnosis Date   Abnormal vaginal Pap  smear    Acne rosacea    Adenomatous colon polyp 11/21/2012   Allergy    Anemia    Anxiety    Asthma with COPD with status asthmaticus (HCC)    Chronic kidney disease    Pt is on transplant list at [facility]   Depression    resolved   Diabetes mellitus    Dysrhythmia    pvc   GERD (gastroesophageal reflux disease)    Hot flashes    Hyperlipidemia    preventative meds per pt    Hypertension     resolved,     dr hochrein   Migraines    PVC (premature ventricular  contraction)    Sleep apnea    workup still in process    Social History   Socioeconomic History   Marital status: Divorced    Spouse name: Not on file   Number of children: 1   Years of education: Not on file   Highest education level: Not on file  Occupational History   Occupation: PT    Employer: [employer]   Occupation: retired  Tobacco Use   Smoking status: Never   Smokeless tobacco: Never  Vaping Use   Vaping status: Never Used  Substance and Sexual Activity   Alcohol use: Never    Comment: twice a year mixed drink   Drug use: Never   Sexual activity: Not Currently    Birth control/protection: Surgical  Other Topics Concern   Not on file  Social History Narrative   Social history: She is divorced.  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  Does not smoke or consume alcohol.  She is now caring for her elderly parents in their home which is stressful.  However she says it is less stressful now that she is living with them.       Family history: Brother with history of hypertrophic cardiomyopathy.  1 sister in good health.  Mother with history of stroke, heart disease, kidney stones as well as diabetes.  Father with history of diabetes.   Social Determinants of Health   Financial Resource Strain: Not on file  Food Insecurity: Not on file  Transportation Needs: Not on file  Physical Activity: Not on file  Stress: Not on file  Social Connections: Not on file  Intimate Partner Violence: Not on file    Past Surgical History:  Procedure Laterality Date   ABDOMINAL HYSTERECTOMY     COLONOSCOPY     POLYPECTOMY     SHOULDER ARTHROSCOPY  06/08/2011   Procedure: ARTHROSCOPY SHOULDER;  Surgeon: Senaida Lange, MD;  Location: [facility] OR;  Service: Orthopedics;  Laterality: Left;  MUA, LOA, SAD, DCR    Family History  Problem Relation Age of Onset   Heart disease Mother 67       CAD   Asthma Mother    Dementia Mother     Stroke Mother    Breast cancer Mother    Diabetes Mother    Heart Problems Father    Diabetes Father    Kidney failure Father    Asthma Sister    Breast cancer Maternal Grandmother    Colon cancer Neg Hx    Esophageal cancer Neg Hx    Rectal cancer Neg Hx    Stomach cancer Neg Hx    Colon polyps Neg Hx     Allergies  Allergen Reactions   Clarithromycin Hives   Vicodin [  Hydrocodone-Acetaminophen] Nausea And Vomiting   Exenatide Other (See Comments)    Rash & itching at injection site    Current Outpatient Medications on File Prior to Visit  Medication Sig Dispense Refill   albuterol (VENTOLIN HFA) 108 (90 Base) MCG/ACT inhaler INHALE TWO PUFFS BY MOUTH EVERY 4 TO 6 HOURS AS NEEDED FOR COUGH OR WHEEZING 18 g 1   aspirin EC 81 MG tablet Take 81 mg by mouth daily. Swallow whole.     atorvastatin (LIPITOR) 20 MG tablet TAKE 1 TABLET BY MOUTH DAILY 30 tablet 2   buPROPion (WELLBUTRIN XL) 300 MG 24 hr tablet TAKE 1 TABLET BY MOUTH DAILY 30 tablet 2   Calcium-Vitamin D 600-200 MG-UNIT per tablet Take 1 tablet by mouth daily.     cetirizine (ZYRTEC) 10 MG tablet Take 10 mg by mouth daily.     Cholecalciferol (VITAMIN D3) 125 MCG (5000 UT) CAPS Take 1 capsule by mouth daily in the afternoon.     Continuous Blood Gluc Sensor (FREESTYLE LIBRE 2 SENSOR) MISC 1 Device by Does not apply route every 14 (fourteen) days. 6 each 1   cyanocobalamin (,VITAMIN B-12,) 1000 MCG/ML injection INJECT 1 ML INTO THE MUSCLE EVERY 30 DAYS (ONCE MONTHLY) 1 mL 5   cyanocobalamin 1000 MCG tablet Take 1,000 mcg by mouth daily.     ferrous fumarate (HEMOCYTE - 106 MG FE) 325 (106 FE) MG TABS Take 1 tablet by mouth daily.     insulin aspart (NOVOLOG FLEXPEN) 100 UNIT/ML FlexPen Inject 8-16 Units into the skin 3 (three) times daily before meals. 30 mL 3   Insulin Glargine (BASAGLAR KWIKPEN) 100 UNIT/ML Inject 26 Units into the skin at bedtime. 15 mL 2   Insulin Pen Needle (DROPLET PEN NEEDLES) 31G X 8 MM MISC USE AS  DIRECTED 30 each 0   loratadine (CLARITIN) 10 MG tablet Take 10 mg by mouth daily after breakfast. For allergies     montelukast (SINGULAIR) 10 MG tablet TAKE 1 TABLET BY MOUTH EVERY MORNING 30 tablet 5   multivitamin (THERAGRAN) per tablet Take 1 tablet by mouth daily.     pioglitazone (ACTOS) 30 MG tablet Take 1 tablet (30 mg total) by mouth daily. 90 tablet 3   venlafaxine XR (EFFEXOR-XR) 150 MG 24 hr capsule TAKE 1 CAPSULE BY MOUTH DAILY WITH BREAKFAST 30 capsule 0   venlafaxine XR (EFFEXOR-XR) 75 MG 24 hr capsule Take 1 capsule (75 mg total) by mouth daily. 90 capsule 1   No current facility-administered medications on file prior to visit.    BP 130/76   Pulse (!) 102   Temp 97.8 F (36.6 C) (Temporal)   Ht 5\' 8"  (1.727 m)   Wt 226 lb 9.6 oz (102.8 kg)   SpO2 96%   BMI 34.45 kg/m  Objective:   Physical Exam Cardiovascular:     Rate and Rhythm: Normal rate and regular rhythm.  Pulmonary:     Effort: Pulmonary effort is normal.     Breath sounds: Normal breath sounds.  Abdominal:     General: Bowel sounds are normal.     Palpations: Abdomen is soft.     Tenderness: There is no abdominal tenderness.  Musculoskeletal:     Cervical back: Neck supple.  Skin:    General: Skin is warm and dry.  Neurological:     Mental Status: She is alert and oriented to person, place, and time.  Psychiatric:        Mood and Affect: Mood  normal.           Assessment & Plan:  Type 2 diabetes mellitus with stage 4 chronic kidney disease, with long-term current use of insulin [facility]) Assessment & Plan: Following with endocrinology, office notes reviewed from August 2024. A1c today of 7.2!  Continue Jardiance 10 mg daily, Basaglar 26 units daily, NovoLog 8 to 16 units 3 times daily with meals. Remain off of Actos for now per nephrology.    Orders: -     POCT glycosylated hemoglobin (Hb A1C)  Gastroesophageal reflux disease, unspecified whether esophagitis present Assessment &  Plan: Controlled.  Reduce pantoprazole to 20 mg daily. She will update  Orders: -     Pantoprazole Sodium; Take 1 tablet (20 mg total) by mouth daily. for heartburn.  Dispense: 90 tablet; Refill: 0  OSA (obstructive sleep apnea) Assessment & Plan: Untreated.  Could be contributing to symptoms of daytime tiredness/fatigue. Referral placed to pulmonology for further evaluation and treatment.  Orders: -     Ambulatory referral to Pulmonology  B12 deficiency Assessment & Plan: Repeat vitamin B-12 level pending.  Continue 1000 mcg supplements daily for now. Consider reintroducing the injections on a monthly basis.  Orders: -     Vitamin B12  Hypertension, unspecified type Assessment & Plan: Controlled.  Remain off treatment for now.   PVC (premature ventricular contraction) Assessment & Plan: Intermittent. Following with cardiology.  Remain off of metoprolol for now given CKD.   Mild intermittent asthma without complication Assessment & Plan: Controlled.  Continue Singulair 10 mg at bedtime, albuterol inhaler as needed.   Stage 4 chronic kidney disease [facility]) Assessment & Plan: Following with nephrology, office notes reviewed from August 2024 through media tab  Continue Jardiance 10 mg daily. Continue diabetes and blood pressure control.   Bipolar affective disorder in remission [facility]) Assessment & Plan: No longer following with psychiatry.  Continue venlafaxine ER to 25 mg daily, bupropion XL 300 mg daily.  Encouraged therapy/grief counseling through [facility].   Hyperlipidemia, unspecified hyperlipidemia type Assessment & Plan: Continue atorvastatin 20 mg daily. Lipid panel reviewed from September 2024 through [facility] Everywhere per cardiology.         Doreene Nest, NP

## 2022-12-26 NOTE — Assessment & Plan Note (Signed)
Continue atorvastatin 20 mg daily. Lipid panel reviewed from September 2024 through [facility] per cardiology.

## 2022-12-26 NOTE — Assessment & Plan Note (Signed)
Controlled.  Continue Singulair 10 mg at bedtime, albuterol inhaler as needed.

## 2022-12-26 NOTE — Assessment & Plan Note (Signed)
Controlled.  Reduce pantoprazole to 20 mg daily. She will update

## 2022-12-26 NOTE — Patient Instructions (Signed)
We reduced your pantoprazole medication to 20 mg daily for heartburn.  Let me know how you do with this!  Stop by the lab prior to leaving today. I will notify you of your results once received.   You will either be contacted via phone regarding your referral to pulmonology for the sleep study, or you may receive a letter on your MyChart portal from our referral team with instructions for scheduling an appointment. Please let us know if you have not been contacted by anyone within two weeks.  It was a pleasure meeting you!

## 2022-12-26 NOTE — Assessment & Plan Note (Signed)
Repeat vitamin B-12 level pending.  Continue 1000 mcg supplements daily for now. Consider reintroducing the injections on a monthly basis.

## 2022-12-27 LAB — VITAMIN B12: Vitamin B-12: 1348 pg/mL — ABNORMAL HIGH (ref 211–911)

## 2022-12-30 ENCOUNTER — Other Ambulatory Visit: Payer: Self-pay | Admitting: Internal Medicine

## 2022-12-30 DIAGNOSIS — N184 Chronic kidney disease, stage 4 (severe): Secondary | ICD-10-CM

## 2022-12-30 DIAGNOSIS — E1122 Type 2 diabetes mellitus with diabetic chronic kidney disease: Secondary | ICD-10-CM

## 2023-01-05 ENCOUNTER — Institutional Professional Consult (permissible substitution): Payer: 59 | Admitting: Adult Health

## 2023-01-15 ENCOUNTER — Other Ambulatory Visit: Payer: Self-pay | Admitting: Internal Medicine

## 2023-01-24 ENCOUNTER — Other Ambulatory Visit: Payer: Self-pay | Admitting: Primary Care

## 2023-01-24 DIAGNOSIS — Z1231 Encounter for screening mammogram for malignant neoplasm of breast: Secondary | ICD-10-CM

## 2023-01-26 ENCOUNTER — Telehealth: Payer: Self-pay | Admitting: Primary Care

## 2023-01-26 NOTE — Telephone Encounter (Signed)
Noted and okay to do so.

## 2023-01-26 NOTE — Telephone Encounter (Signed)
Patient wanted to let Samantha Hahn know that she would like to hold her B12 injections until after her sleep study in January.

## 2023-01-31 DIAGNOSIS — H40039 Anatomical narrow angle, unspecified eye: Secondary | ICD-10-CM | POA: Diagnosis not present

## 2023-01-31 DIAGNOSIS — H2513 Age-related nuclear cataract, bilateral: Secondary | ICD-10-CM | POA: Diagnosis not present

## 2023-01-31 DIAGNOSIS — E119 Type 2 diabetes mellitus without complications: Secondary | ICD-10-CM | POA: Diagnosis not present

## 2023-02-05 ENCOUNTER — Other Ambulatory Visit: Payer: Self-pay | Admitting: Physician Assistant

## 2023-02-12 ENCOUNTER — Other Ambulatory Visit: Payer: Self-pay | Admitting: Physician Assistant

## 2023-02-12 ENCOUNTER — Ambulatory Visit: Payer: 59 | Admitting: Internal Medicine

## 2023-02-12 ENCOUNTER — Encounter: Payer: Self-pay | Admitting: Internal Medicine

## 2023-02-12 VITALS — BP 120/80 | HR 106 | Ht 68.0 in | Wt 230.0 lb

## 2023-02-12 DIAGNOSIS — E785 Hyperlipidemia, unspecified: Secondary | ICD-10-CM | POA: Diagnosis not present

## 2023-02-12 DIAGNOSIS — E1122 Type 2 diabetes mellitus with diabetic chronic kidney disease: Secondary | ICD-10-CM | POA: Diagnosis not present

## 2023-02-12 DIAGNOSIS — R609 Edema, unspecified: Secondary | ICD-10-CM | POA: Diagnosis not present

## 2023-02-12 DIAGNOSIS — Z794 Long term (current) use of insulin: Secondary | ICD-10-CM | POA: Diagnosis not present

## 2023-02-12 DIAGNOSIS — N184 Chronic kidney disease, stage 4 (severe): Secondary | ICD-10-CM | POA: Diagnosis not present

## 2023-02-12 DIAGNOSIS — E669 Obesity, unspecified: Secondary | ICD-10-CM | POA: Diagnosis not present

## 2023-02-12 DIAGNOSIS — I129 Hypertensive chronic kidney disease with stage 1 through stage 4 chronic kidney disease, or unspecified chronic kidney disease: Secondary | ICD-10-CM | POA: Diagnosis not present

## 2023-02-12 LAB — LAB REPORT - SCANNED: EGFR: 21

## 2023-02-12 NOTE — Progress Notes (Signed)
Patient ID: Samantha Hahn, female   DOB: [birth year], 62 y.o.   MRN: [id number]  HPI: Samantha Hahn is a 62 y.o.-year-old female, returning for follow-up for DM2, dx in 2003, insulin-dependent since 2022, uncontrolled, with complications (CKD stage 4). Pt. previously saw Dr. Everardo All, but last visit with me 4 months ago.  Interim history: No increased urination, blurry vision, nausea, chest pain.  Reviewed HbA1c: Lab Results  Component Value Date   HGBA1C 7.2 (A) 12/26/2022   HGBA1C 11.3 (A) 10/25/2021   HGBA1C 9.6 (H) 06/16/2021   HGBA1C 12.4 (A) 03/18/2021   HGBA1C 10.8 (A) 11/05/2020   HGBA1C 12.8 (H) 09/21/2020   HGBA1C 7.8 (H) 03/29/2020   HGBA1C 7.6 (A) 12/01/2019   HGBA1C 7.7 (H) 09/09/2019   HGBA1C 6.7 (H) 03/03/2019    Pt is on a regimen of: - Actos 30 mg at night >> Jardiance 10 mg before b'fast. (Changed recently by nephrology) - Novolog 6-10 >> 14 units 3x a day, before meals (not 15 min before) >> 8-16 units 15 minutes before each meal - Basaglar 14 units-added 10/2021 >> 22 >> 26 units at night She had a rash with [place]. She had nausea and vomiting with Rybelsus. We stopped glipizide XL 2.5 mg 10/2021  Pt checks her sugars 4x a day and they are:  Previously:   Lowest sugar was 59 >> 50s; she has hypoglycemia awareness at 70.  Highest sugar was 449 >> 200s.  Glucometer: Accu-Chek  - + CKD  - stage 4 - on the renal transplant list at [facility], last BUN/creatinine:  12/22/2022: 31/2.51, GFR 21, glucose 40 Lab Results  Component Value Date   BUN 41 (H) 06/16/2021   BUN 34 (A) 01/26/2021   CREATININE 2.85 (H) 06/16/2021   CREATININE 2.3 (A) 01/26/2021  She is not on ACE inhibitor/ARB.  -+ HL; last set of lipids: 11/02/2022: 164/153/47/86 Lab Results  Component Value Date   CHOL 161 06/16/2021   HDL 49 (L) 06/16/2021   LDLCALC 85 06/16/2021   TRIG 176 (H) 06/16/2021   CHOLHDL 3.3 06/16/2021  She is on Lipitor 20 mg daily.  - last eye exam was in 01/2023:  No DR reportedly. + incipient cataract. + small angle glaucoma. She has a separate BCBS 2020 eye insurance. Dr Dorcas Mcmurray.  - no numbness and tingling in her feet.  Last foot exam 10/12/2022.  She also has a history of HTN, OSA, anemia, GERD, PVCs, anxiety.  ROS: + see HPI  Past Medical History:  Diagnosis Date   Abnormal vaginal Pap smear    Acne rosacea    Adenomatous colon polyp 11/21/2012   Allergy    Anemia    Anxiety    Asthma with COPD with status asthmaticus (HCC)    Chronic kidney disease    Pt is on transplant list at [facility]   Depression    resolved   Diabetes mellitus    Dysrhythmia    pvc   GERD (gastroesophageal reflux disease)    Hot flashes    Hyperlipidemia    preventative meds per pt    Hypertension     resolved,     dr hochrein   Migraines    PVC (premature ventricular contraction)    Sleep apnea    workup still in process   Past Surgical History:  Procedure Laterality Date   ABDOMINAL HYSTERECTOMY     COLONOSCOPY     POLYPECTOMY     SHOULDER ARTHROSCOPY  06/08/2011   Procedure:  ARTHROSCOPY SHOULDER;  Surgeon: Senaida Lange, MD;  Location[facility] OR;  Service: Orthopedics;  Laterality: Left;  MUA, LOA, SAD, DCR   Social History   Socioeconomic History   Marital status: Divorced    Spouse name: Not on file   Number of children: 1   Years of education: Not on file   Highest education level: Not on file  Occupational History   Occupation: PT    Employer: [employer]   Occupation: retired  Tobacco Use   Smoking status: Never   Smokeless tobacco: Never  Vaping Use   Vaping status: Never Used  Substance and Sexual Activity   Alcohol use: Never    Comment: twice a year mixed drink   Drug use: Never   Sexual activity: Not Currently    Birth control/protection: Surgical  Other Topics Concern   Not on file  Social History Narrative   Social history: She is divorced.  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  She has 1  adult daughter.  Does not smoke or consume alcohol.  She is now caring for her elderly parents in their home which is stressful.  However she says it is less stressful now that she is living with them.       Family history: Brother with history of hypertrophic cardiomyopathy.  1 sister in good health.  Mother with history of stroke, heart disease, kidney stones as well as diabetes.  Father with history of diabetes.   Social Drivers of Corporate investment banker Strain: Not on file  Food Insecurity: Not on file  Transportation Needs: Not on file  Physical Activity: Not on file  Stress: Not on file  Social Connections: Not on file  Intimate Partner Violence: Not on file   Current Outpatient Medications on File Prior to Visit  Medication Sig Dispense Refill   albuterol (VENTOLIN HFA) 108 (90 Base) MCG/ACT inhaler INHALE TWO PUFFS BY MOUTH EVERY 4 TO 6 HOURS AS NEEDED FOR COUGH OR WHEEZING 18 g 1   aspirin EC 81 MG tablet Take 81 mg by mouth daily. Swallow whole.     atorvastatin (LIPITOR) 20 MG tablet TAKE 1 TABLET BY MOUTH DAILY 30 tablet 2   buPROPion (WELLBUTRIN XL) 300 MG 24 hr tablet TAKE 1 TABLET BY MOUTH DAILY 30 tablet 2   Calcium-Vitamin D 600-200 MG-UNIT per tablet Take 1 tablet by mouth daily.     cetirizine (ZYRTEC) 10 MG tablet Take 10 mg by mouth daily.     Cholecalciferol (VITAMIN D3) 125 MCG (5000 UT) CAPS Take 1 capsule by mouth daily in the afternoon.     Continuous Glucose Sensor (FREESTYLE LIBRE 2 SENSOR) MISC APPLY SENSOR TO SKIN FOR CONTINUOUS BLOOD SUGAR MONITORING, REPLACE EVERY 14 DAYS 6 each 2   cyanocobalamin (,VITAMIN B-12,) 1000 MCG/ML injection INJECT 1 ML INTO THE MUSCLE EVERY 30 DAYS (ONCE MONTHLY) 1 mL 5   cyanocobalamin 1000 MCG tablet Take 1,000 mcg by mouth daily.     ferrous fumarate (HEMOCYTE - 106 MG FE) 325 (106 FE) MG TABS Take 1 tablet by mouth daily.     insulin aspart (NOVOLOG FLEXPEN) 100 UNIT/ML FlexPen Inject 8-16 Units into the skin 3 (three)  times daily before meals. 30 mL 3   Insulin Glargine (BASAGLAR KWIKPEN) 100 UNIT/ML Inject 26 Units into the skin at bedtime. 15 mL 2   Insulin Pen Needle (DROPLET PEN NEEDLES) 31G X 8 MM MISC USE AS DIRECTED 100  each 5   loratadine (CLARITIN) 10 MG tablet Take 10 mg by mouth daily after breakfast. For allergies     montelukast (SINGULAIR) 10 MG tablet TAKE 1 TABLET BY MOUTH EVERY MORNING 30 tablet 5   multivitamin (THERAGRAN) per tablet Take 1 tablet by mouth daily.     pantoprazole (PROTONIX) 20 MG tablet Take 1 tablet (20 mg total) by mouth daily. for heartburn. 90 tablet 0   pioglitazone (ACTOS) 30 MG tablet Take 1 tablet (30 mg total) by mouth daily. 90 tablet 3   venlafaxine XR (EFFEXOR-XR) 150 MG 24 hr capsule TAKE 1 CAPSULE BY MOUTH DAILY WITH BREAKFAST 30 capsule 0   venlafaxine XR (EFFEXOR-XR) 75 MG 24 hr capsule Take 1 capsule (75 mg total) by mouth daily. 90 capsule 1   No current facility-administered medications on file prior to visit.   Allergies  Allergen Reactions   Clarithromycin Hives   Vicodin [Hydrocodone-Acetaminophen] Nausea And Vomiting   Exenatide Other (See Comments)    Rash & itching at injection site   Family History  Problem Relation Age of Onset   Heart disease Mother 26       CAD   Asthma Mother    Dementia Mother    Stroke Mother    Breast cancer Mother    Diabetes Mother    Heart Problems Father    Diabetes Father    Kidney failure Father    Asthma Sister    Breast cancer Maternal Grandmother    Colon cancer Neg Hx    Esophageal cancer Neg Hx    Rectal cancer Neg Hx    Stomach cancer Neg Hx    Colon polyps Neg Hx    PE: BP 120/80   Pulse (!) 106   Ht 5\' 8"  (1.727 m)   Wt 230 lb (104.3 kg)   SpO2 99%   BMI 34.97 kg/m  Wt Readings from Last 3 Encounters:  02/12/23 230 lb (104.3 kg)  12/26/22 226 lb 9.6 oz (102.8 kg)  10/12/22 228 lb 3.2 oz (103.5 kg)   Constitutional: overweight, in NAD Eyes: no exophthalmos ENT:  no thyromegaly,  no cervical lymphadenopathy Cardiovascular: Tachycardia, RR, No MRG Respiratory: CTA B Musculoskeletal: no deformities Skin: Dry, warm, no rashes Neurological: +  tremor with outstretched hands  ASSESSMENT: 1. DM2, insulin-dependent, uncontrolled, with complications - stage 4 CKD  2. HL  PLAN:  1. Patient with longstanding, uncontrolled, type 2 diabetes, on TZD and basal-bolus insulin regimen with significantly improved control after adding long-acting insulin.  HbA1c was 11.3% before starting Basaglar and sugars are very high in the morning, between 200s and 400s.  At last visit, reviewing the CGM trends sugars were fluctuating in the upper limit of the target range with significant hyperglycemic peaks after late lunch with occasional low blood sugars after each of the meals.  Upon questioning, she was not varying the dose of NovoLog with the size of the meal but taking a fixed dose of 14 units before each meal.  I advised her to vary the dose between 8 and 16 units.  I also advised her to take the NovoLog 15 minutes before meals, as she was taking it right before eating.  We also increased the Basaglar dose slightly.  HbA1c at last visit was 7.8%, much improved.  Since then, she had another HbA1c, which was even lower, on 12/26/2022: 7.2%. -However, at her visit with nephrology on 12/22/2022, she had a very low blood sugar at 40.  She  mentions that this was an isolated incident. CGM interpretation: -At today's visit, we reviewed her CGM downloads: It appears that 72% of values are in target range (goal >70%), while 26.10 are higher than 180 (goal <25%), and 2% are lower than 70 (goal <4%).  The calculated average blood sugar is 151.  The projected HbA1c for the next 3 months ([facility]MI) is 6.9%. -Reviewing the CGM trends, sugars are mostly fluctuating within the target range but with very fluctuating blood sugars after dinner, fluctuating between 70 and 270.  There is no consistent pattern.  She also had  occasional low blood sugars during the day.  In this situation, we discussed that since she has both lows and highs, we first need to correct the lows and then address the highs.  I therefore advised her to reduce the dose of Basaglar and also the dose of NovoLog with smaller meals especially dinner.  We can continue Jardiance for now.  We discussed that this is mostly used for kidney and cardiovascular protection because it is not very impactful on blood sugars in the setting of low kidney function. - I suggested to:  Patient Instructions  Please continue: - Jardiance 10 mg daily in am  Please decrease: - Basaglar 22 units at bedtime  - NovoLog 8-12 (14) units 15 min before each main meal  Restart the sensor.  Please return in 3-4 months.   - advised to check sugars at different times of the day - 4x a day, rotating check times - advised for yearly eye exams >> she is UTD - return to clinic in 3-4 months  2. HL -Reviewed latest lipid panel from 10/2022: LDL above our target of less than 70, triglycerides within normal range, HDL slightly low: -She continues  Lipitor 10 mg daily without side effects  Carlus Pavlov, MD PhD [facility]

## 2023-02-12 NOTE — Patient Instructions (Addendum)
Please continue: - Jardiance 10 mg daily in am  Please decrease: - Basaglar 22 units at bedtime  - NovoLog 8-12 (14) units 15 min before each main meal  Restart the sensor.  Please return in 3-4 months.

## 2023-02-22 ENCOUNTER — Ambulatory Visit: Payer: 59

## 2023-02-22 ENCOUNTER — Institutional Professional Consult (permissible substitution): Payer: 59 | Admitting: Nurse Practitioner

## 2023-03-01 ENCOUNTER — Institutional Professional Consult (permissible substitution): Payer: 59 | Admitting: Internal Medicine

## 2023-03-06 ENCOUNTER — Other Ambulatory Visit: Payer: Self-pay | Admitting: Physician Assistant

## 2023-03-06 ENCOUNTER — Other Ambulatory Visit: Payer: Self-pay

## 2023-03-06 DIAGNOSIS — F3170 Bipolar disorder, currently in remission, most recent episode unspecified: Secondary | ICD-10-CM

## 2023-03-06 DIAGNOSIS — F419 Anxiety disorder, unspecified: Secondary | ICD-10-CM

## 2023-03-07 MED ORDER — VENLAFAXINE HCL ER 150 MG PO CP24: 150.0000 mg | ORAL_CAPSULE | Freq: Every day | ORAL | 2 refills | Status: DC

## 2023-03-07 MED ORDER — VENLAFAXINE HCL ER 75 MG PO CP24: 75.0000 mg | ORAL_CAPSULE | Freq: Every day | ORAL | 2 refills | Status: DC

## 2023-03-09 DIAGNOSIS — N184 Chronic kidney disease, stage 4 (severe): Secondary | ICD-10-CM | POA: Diagnosis not present

## 2023-03-15 ENCOUNTER — Ambulatory Visit: Payer: 59

## 2023-03-28 ENCOUNTER — Institutional Professional Consult (permissible substitution): Payer: 59 | Admitting: Internal Medicine

## 2023-03-29 ENCOUNTER — Institutional Professional Consult (permissible substitution): Payer: 59 | Admitting: Internal Medicine

## 2023-03-30 ENCOUNTER — Ambulatory Visit: Payer: 59

## 2023-04-10 ENCOUNTER — Ambulatory Visit: Payer: 59

## 2023-04-19 ENCOUNTER — Other Ambulatory Visit: Payer: Self-pay

## 2023-04-19 ENCOUNTER — Institutional Professional Consult (permissible substitution): Payer: 59 | Admitting: Internal Medicine

## 2023-04-19 DIAGNOSIS — F3170 Bipolar disorder, currently in remission, most recent episode unspecified: Secondary | ICD-10-CM

## 2023-04-19 DIAGNOSIS — E785 Hyperlipidemia, unspecified: Secondary | ICD-10-CM

## 2023-04-19 MED ORDER — ATORVASTATIN CALCIUM 20 MG PO TABS: 20.0000 mg | ORAL_TABLET | Freq: Every day | ORAL | 1 refills | Status: DC

## 2023-04-19 MED ORDER — BUPROPION HCL ER (XL) 300 MG PO TB24: 300.0000 mg | ORAL_TABLET | Freq: Every day | ORAL | 1 refills | Status: DC

## 2023-04-20 ENCOUNTER — Ambulatory Visit: Payer: 59

## 2023-05-17 DIAGNOSIS — R609 Edema, unspecified: Secondary | ICD-10-CM | POA: Diagnosis not present

## 2023-05-17 DIAGNOSIS — I129 Hypertensive chronic kidney disease with stage 1 through stage 4 chronic kidney disease, or unspecified chronic kidney disease: Secondary | ICD-10-CM | POA: Diagnosis not present

## 2023-05-17 DIAGNOSIS — E669 Obesity, unspecified: Secondary | ICD-10-CM | POA: Diagnosis not present

## 2023-05-17 DIAGNOSIS — E1122 Type 2 diabetes mellitus with diabetic chronic kidney disease: Secondary | ICD-10-CM | POA: Diagnosis not present

## 2023-05-17 DIAGNOSIS — N184 Chronic kidney disease, stage 4 (severe): Secondary | ICD-10-CM | POA: Diagnosis not present

## 2023-05-18 ENCOUNTER — Ambulatory Visit: Payer: 59 | Admitting: Internal Medicine

## 2023-05-18 ENCOUNTER — Encounter: Payer: Self-pay | Admitting: Internal Medicine

## 2023-05-18 VITALS — BP 126/82 | HR 116 | Resp 16 | Ht 68.0 in | Wt 228.2 lb

## 2023-05-18 DIAGNOSIS — E1122 Type 2 diabetes mellitus with diabetic chronic kidney disease: Secondary | ICD-10-CM

## 2023-05-18 DIAGNOSIS — N184 Chronic kidney disease, stage 4 (severe): Secondary | ICD-10-CM

## 2023-05-18 DIAGNOSIS — Z794 Long term (current) use of insulin: Secondary | ICD-10-CM

## 2023-05-18 DIAGNOSIS — E785 Hyperlipidemia, unspecified: Secondary | ICD-10-CM

## 2023-05-18 LAB — POCT GLYCOSYLATED HEMOGLOBIN (HGB A1C): Hemoglobin A1C: 7.6 % — AB (ref 4.0–5.6)

## 2023-05-18 NOTE — Patient Instructions (Addendum)
 Please continue: - Jardiance 10 mg daily in am - Basaglar 26 units at bedtime  - NovoLog 8-16 units 15 min before each main meal  If you have a snack, you may need NovoLog 5-6 units before the snack.  Please return in 3-4 months.

## 2023-05-18 NOTE — Progress Notes (Signed)
 Patient ID: DEARI SESSLER, female   DOB: 1960-11-16, 63 y.o.   MRN: [id number]  HPI: JAALA BOHLE is a 63 y.o.-year-old female, returning for follow-up for DM2, dx in 2003, insulin-dependent since 2022, uncontrolled, with complications (CKD stage 4). Pt. previously saw Dr. Everardo All, but last visit with me 4 months ago.  Interim history: No increased urination, blurry vision, nausea, chest pain. She recently had the flu and then gastroenteritis >> sugars higher.  She is to feel better in the last 2 weeks and sugars improved.  Reviewed HbA1c: Lab Results  Component Value Date   HGBA1C 7.2 (A) 12/26/2022   HGBA1C 11.3 (A) 10/25/2021   HGBA1C 9.6 (H) 06/16/2021   HGBA1C 12.4 (A) 03/18/2021   HGBA1C 10.8 (A) 11/05/2020   HGBA1C 12.8 (H) 09/21/2020   HGBA1C 7.8 (H) 03/29/2020   HGBA1C 7.6 (A) 12/01/2019   HGBA1C 7.7 (H) 09/09/2019   HGBA1C 6.7 (H) 03/03/2019    Pt is on a regimen of: - Jardiance 10 mg before b'fast. (Changed by nephrology) - Novolog 6-10 >> 14 units 3x a day, before meals (not 15 min before) >> 8-16 units 15 minutes before each meal - Basaglar 14 units-added 10/2021 >> 22 >> 26 units at night She had a rash with Byetta. She had nausea and vomiting with Rybelsus. We stopped glipizide XL 2.5 mg 10/2021 She was on Actos 30 mg daily before changing to Jardiance in 2024.  Pt checks her sugars 4x a day and they are:  Previously:  Previously:   Lowest sugar was 59 >> 50s >> 50s; she has hypoglycemia awareness at 70.  Highest sugar was 449 >> 200s.  Glucometer: Accu-Chek  - + CKD  - stage 4 - on the renal [facility] list at [facility], last BUN/creatinine:   12/22/2022: 31/2.51, GFR 21, glucose 40 Lab Results  Component Value Date   BUN 41 (H) 06/16/2021   BUN 34 (A) 01/26/2021   CREATININE 2.85 (H) 06/16/2021   CREATININE 2.3 (A) 01/26/2021  She is not on ACE inhibitor/ARB.  -+ HL; last set of lipids: 11/02/2022: 164/153/47/86 Lab Results  Component Value Date    CHOL 161 06/16/2021   HDL 49 (L) 06/16/2021   LDLCALC 85 06/16/2021   TRIG 176 (H) 06/16/2021   CHOLHDL 3.3 06/16/2021  She is on Lipitor 20 mg daily.  - last eye exam was in 01/2023: No DR reportedly. + incipient cataract. + small angle glaucoma. She has a separate BCBS 2020 eye insurance. Dr Dorcas Mcmurray.  - no numbness and tingling in her feet.  Last foot exam 10/12/2022.  She also has a history of HTN, OSA, anemia, GERD, PVCs, anxiety.  ROS: + see HPI  Past Medical History:  Diagnosis Date   Abnormal vaginal Pap smear    Acne rosacea    Adenomatous colon polyp 11/21/2012   Allergy    Anemia    Anxiety    Asthma with COPD with status asthmaticus (HCC)    Chronic kidney disease    Pt is on transplant list at [facility]   Depression    resolved   Diabetes mellitus    Dysrhythmia    pvc   GERD (gastroesophageal reflux disease)    Hot flashes    Hyperlipidemia    preventative meds per pt    Hypertension     resolved,     dr hochrein   Migraines    PVC (premature ventricular contraction)    Sleep apnea    workup still  in process   Past Surgical History:  Procedure Laterality Date   ABDOMINAL HYSTERECTOMY     COLONOSCOPY     POLYPECTOMY     SHOULDER ARTHROSCOPY  06/08/2011   Procedure: ARTHROSCOPY SHOULDER;  Surgeon: Senaida Lange, MD;  Location: [facility] OR;  Service: Orthopedics;  Laterality: Left;  MUA, LOA, SAD, DCR   Social History   Socioeconomic History   Marital status: Divorced    Spouse name: Not on file   Number of children: 1   Years of education: Not on file   Highest education level: Not on file  Occupational History   Occupation: PT    Employer: [employer]   Occupation: retired  Tobacco Use   Smoking status: Never   Smokeless tobacco: Never  Vaping Use   Vaping status: Never Used  Substance and Sexual Activity   Alcohol use: Never    Comment: twice a year mixed drink   Drug use: Never   Sexual activity: Not Currently    Birth control/protection:  Surgical  Other Topics Concern   Not on file  Social History Narrative   Social history: She is divorced.  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  Does not smoke or consume alcohol.  She is now caring for her elderly parents in their home which is stressful.  However she says it is less stressful now that she is living with them.       Family history: Brother with history of hypertrophic cardiomyopathy.  1 sister in good health.  Mother with history of stroke, heart disease, kidney stones as well as diabetes.  Father with history of diabetes.   Social Drivers of Corporate investment banker Strain: Not on file  Food Insecurity: Not on file  Transportation Needs: Not on file  Physical Activity: Not on file  Stress: Not on file  Social Connections: Not on file  Intimate Partner Violence: Not on file   Current Outpatient Medications on File Prior to Visit  Medication Sig Dispense Refill   albuterol (VENTOLIN HFA) 108 (90 Base) MCG/ACT inhaler INHALE TWO PUFFS BY MOUTH EVERY 4 TO 6 HOURS AS NEEDED FOR COUGH OR WHEEZING 18 g 1   aspirin EC 81 MG tablet Take 81 mg by mouth daily. Swallow whole.     atorvastatin (LIPITOR) 20 MG tablet Take 1 tablet (20 mg total) by mouth daily. for cholesterol. 90 tablet 1   buPROPion (WELLBUTRIN XL) 300 MG 24 hr tablet Take 1 tablet (300 mg total) by mouth daily. For anxiety/depression 90 tablet 1   Calcium-Vitamin D 600-200 MG-UNIT per tablet Take 1 tablet by mouth daily.     cetirizine (ZYRTEC) 10 MG tablet Take 10 mg by mouth daily.     Cholecalciferol (VITAMIN D3) 125 MCG (5000 UT) CAPS Take 1 capsule by mouth daily in the afternoon.     Continuous Glucose Sensor (FREESTYLE LIBRE 2 SENSOR) MISC APPLY SENSOR TO SKIN FOR CONTINUOUS BLOOD SUGAR MONITORING, REPLACE EVERY 14 DAYS 6 each 2   cyanocobalamin (,VITAMIN B-12,) 1000 MCG/ML injection INJECT 1 ML INTO THE MUSCLE EVERY 30 DAYS (ONCE MONTHLY) 1 mL 5    cyanocobalamin 1000 MCG tablet Take 1,000 mcg by mouth daily.     empagliflozin (JARDIANCE) 10 MG TABS tablet Take by mouth daily.     ferrous fumarate (HEMOCYTE - 106 MG FE) 325 (106 FE) MG TABS Take 1 tablet by mouth daily.  insulin aspart (NOVOLOG FLEXPEN) 100 UNIT/ML FlexPen Inject 8-16 Units into the skin 3 (three) times daily before meals. 30 mL 3   Insulin Glargine (BASAGLAR KWIKPEN) 100 UNIT/ML Inject 26 Units into the skin at bedtime. 15 mL 2   Insulin Pen Needle (DROPLET PEN NEEDLES) 31G X 8 MM MISC USE AS DIRECTED 100 each 5   loratadine (CLARITIN) 10 MG tablet Take 10 mg by mouth daily after breakfast. For allergies     montelukast (SINGULAIR) 10 MG tablet TAKE 1 TABLET BY MOUTH EVERY MORNING 30 tablet 5   multivitamin (THERAGRAN) per tablet Take 1 tablet by mouth daily.     pantoprazole (PROTONIX) 20 MG tablet Take 1 tablet (20 mg total) by mouth daily. for heartburn. 90 tablet 0   venlafaxine XR (EFFEXOR-XR) 150 MG 24 hr capsule Take 1 capsule (150 mg total) by mouth daily with breakfast. for anxiety and depression. 90 capsule 2   venlafaxine XR (EFFEXOR-XR) 75 MG 24 hr capsule Take 1 capsule (75 mg total) by mouth daily. for anxiety and depression. 90 capsule 2   No current facility-administered medications on file prior to visit.   Allergies  Allergen Reactions   Clarithromycin Hives   Vicodin [Hydrocodone-Acetaminophen] Nausea And Vomiting   Exenatide Other (See Comments)    Rash & itching at injection site   Family History  Problem Relation Age of Onset   Heart disease Mother 53       CAD   Asthma Mother    Dementia Mother    Stroke Mother    Breast cancer Mother    Diabetes Mother    Heart Problems Father    Diabetes Father    Kidney failure Father    Asthma Sister    Breast cancer Maternal Grandmother    Colon cancer Neg Hx    Esophageal cancer Neg Hx    Rectal cancer Neg Hx    Stomach cancer Neg Hx    Colon polyps Neg Hx    PE: BP 126/82   Pulse (!)  116   Resp 16   Ht 5\' 8"  (1.727 m)   Wt 228 lb 3.2 oz (103.5 kg)   SpO2 97%   BMI 34.70 kg/m  Wt Readings from Last 3 Encounters:  05/18/23 228 lb 3.2 oz (103.5 kg)  02/12/23 230 lb (104.3 kg)  12/26/22 226 lb 9.6 oz (102.8 kg)   Constitutional: overweight, in NAD Eyes: no exophthalmos ENT:  no thyromegaly, no cervical lymphadenopathy Cardiovascular: Tachycardia, RR, No MRG Respiratory: CTA B Musculoskeletal: no deformities Skin: no rashes Neurological: +  tremor with outstretched hands  ASSESSMENT: 1. DM2, insulin-dependent, uncontrolled, with complications - stage 4 CKD  2. HL  PLAN:  1. Patient with longstanding, uncontrolled, type 2 diabetes, on SGLT2 inhibitor and basal large bolus insulin regimen and basal large bolus insulin regimen, with improvement of control after addition of long-acting insulin.  HbA1c was 11.3% before starting Basaglar and at last visit this was 7.2%.  She did start to have lower blood sugars, down to 40 on labs by nephrology (12/22/2022) but otherwise sugars were fluctuating between 70 and 270 without a consistent pattern.  So I advised her to reduce her insulin doses. CGM interpretation: -At today's visit, we reviewed her CGM downloads: It appears that 67% of values are in target range (goal >70%), while 30% are higher than 180 (goal <25%), and 3% are lower than 70 (goal <4%).  The calculated average blood sugar is 152.  The projected  HbA1c for the next 3 months (GMI) is 6.9% -Reviewing her blood sugars, they are still fluctuating, mainly within the target range but increasing after her snack at night then dropping afterwards as she corrects the high blood sugars after the snack and then bouncing up again in the early morning.  This is followed by a drop in blood sugars after lunch.  We discussed that we need to avoid the blood sugars going up after the snack at night.  Ideally, she would like snack after dinner but if she does, she needs to cover this  with insulin.  I advised her to try to avoid correcting blood sugars before going to bed.  We also discussed about varying the dose of NovoLog based on observed patterns after meals.  For example, she may need a lower dose of insulin before lunch as sugars are lower after this meal and the higher dose of insulin after dinner since sugars are higher afterwards.  Otherwise, will not change the rest of her regimen. - I suggested to:  Patient Instructions  Please continue: - Jardiance 10 mg daily in am - Basaglar 26 units at bedtime  - NovoLog 8-16 units 15 min before each main meal  If you have a snack, you may need NovoLog 5-6 units before the snack.  Please return in 3-4 months.   - we checked her HbA1c: 7.6% (higher) -Of note, she believes she needs an HbA1c lower than 7% to qualify for transplant. - advised to check sugars at different times of the day - 4x a day, rotating check times - advised for yearly eye exams >> she is UTD - return to clinic in 3-4 months  2. HL - Reviewed latest lipid panel from 10/2022: LDL above our target of less than 70, triglycerides normal, HDL slightly low - Continues Lipitor 10 mg daily without side effects  Carlus Pavlov, MD PhD [facility] Endocrinology

## 2023-05-19 LAB — LAB REPORT - SCANNED
A1c: 7.3
Creatinine, POC: 59.8 mg/dL
EGFR: 22

## 2023-06-28 ENCOUNTER — Ambulatory Visit

## 2023-06-28 ENCOUNTER — Ambulatory Visit
Admission: RE | Admit: 2023-06-28 | Discharge: 2023-06-28 | Disposition: A | Discharge status: Home / Self Care | Source: Ambulatory Visit | Attending: Primary Care | Admitting: Primary Care

## 2023-06-28 DIAGNOSIS — Z1231 Encounter for screening mammogram for malignant neoplasm of breast: Secondary | ICD-10-CM

## 2023-07-05 ENCOUNTER — Other Ambulatory Visit: Payer: Self-pay | Admitting: Physician Assistant

## 2023-07-05 ENCOUNTER — Other Ambulatory Visit: Payer: Self-pay | Admitting: Internal Medicine

## 2023-07-06 NOTE — Telephone Encounter (Signed)
 Requested Prescriptions   Pending Prescriptions Disp Refills   Insulin Glargine  (BASAGLAR  KWIKPEN) 100 UNIT/ML [Pharmacy Med Name: BASAGLAR  100 UNIT/ML KWIKPEN] 15 mL 2    Sig: INJECT 26 UNITS INTO THE SKIN AT BEDTIME

## 2023-07-31 ENCOUNTER — Encounter: Admitting: Primary Care

## 2023-08-02 ENCOUNTER — Encounter: Admitting: Primary Care

## 2023-08-07 ENCOUNTER — Encounter: Admitting: Primary Care

## 2023-08-10 DIAGNOSIS — R9439 Abnormal result of other cardiovascular function study: Secondary | ICD-10-CM | POA: Diagnosis not present

## 2023-08-10 DIAGNOSIS — N184 Chronic kidney disease, stage 4 (severe): Secondary | ICD-10-CM | POA: Diagnosis not present

## 2023-08-10 DIAGNOSIS — R000 Tachycardia, unspecified: Secondary | ICD-10-CM | POA: Diagnosis not present

## 2023-08-10 DIAGNOSIS — J45909 Unspecified asthma, uncomplicated: Secondary | ICD-10-CM | POA: Diagnosis not present

## 2023-08-10 DIAGNOSIS — Z1159 Encounter for screening for other viral diseases: Secondary | ICD-10-CM | POA: Diagnosis not present

## 2023-08-10 DIAGNOSIS — Z6833 Body mass index (BMI) 33.0-33.9, adult: Secondary | ICD-10-CM | POA: Diagnosis not present

## 2023-08-10 DIAGNOSIS — Z794 Long term (current) use of insulin: Secondary | ICD-10-CM | POA: Diagnosis not present

## 2023-08-10 DIAGNOSIS — G43909 Migraine, unspecified, not intractable, without status migrainosus: Secondary | ICD-10-CM | POA: Diagnosis not present

## 2023-08-10 DIAGNOSIS — F32A Depression, unspecified: Secondary | ICD-10-CM | POA: Diagnosis not present

## 2023-08-10 DIAGNOSIS — Z7682 Awaiting organ transplant status: Secondary | ICD-10-CM | POA: Diagnosis not present

## 2023-08-10 DIAGNOSIS — E1122 Type 2 diabetes mellitus with diabetic chronic kidney disease: Secondary | ICD-10-CM | POA: Diagnosis not present

## 2023-08-10 DIAGNOSIS — Z114 Encounter for screening for human immunodeficiency virus [HIV]: Secondary | ICD-10-CM | POA: Diagnosis not present

## 2023-08-10 DIAGNOSIS — E1121 Type 2 diabetes mellitus with diabetic nephropathy: Secondary | ICD-10-CM | POA: Diagnosis not present

## 2023-08-14 DIAGNOSIS — R609 Edema, unspecified: Secondary | ICD-10-CM | POA: Diagnosis not present

## 2023-08-14 DIAGNOSIS — N184 Chronic kidney disease, stage 4 (severe): Secondary | ICD-10-CM | POA: Diagnosis not present

## 2023-08-14 DIAGNOSIS — E669 Obesity, unspecified: Secondary | ICD-10-CM | POA: Diagnosis not present

## 2023-08-14 DIAGNOSIS — E1122 Type 2 diabetes mellitus with diabetic chronic kidney disease: Secondary | ICD-10-CM | POA: Diagnosis not present

## 2023-08-14 DIAGNOSIS — I129 Hypertensive chronic kidney disease with stage 1 through stage 4 chronic kidney disease, or unspecified chronic kidney disease: Secondary | ICD-10-CM | POA: Diagnosis not present

## 2023-08-20 ENCOUNTER — Other Ambulatory Visit: Payer: Self-pay | Admitting: Primary Care

## 2023-08-20 DIAGNOSIS — K219 Gastro-esophageal reflux disease without esophagitis: Secondary | ICD-10-CM

## 2023-09-19 DIAGNOSIS — D123 Benign neoplasm of transverse colon: Secondary | ICD-10-CM | POA: Diagnosis not present

## 2023-09-19 DIAGNOSIS — D122 Benign neoplasm of ascending colon: Secondary | ICD-10-CM | POA: Diagnosis not present

## 2023-09-21 ENCOUNTER — Ambulatory Visit: Admitting: Internal Medicine

## 2023-09-21 NOTE — Progress Notes (Deleted)
 Patient ID: Samantha Hahn, female   DOB: February 25, 1960, 63 y.o.   MRN: [id number]  HPI: TERRYANN VERBEEK is a 63 y.o.-year-old female, returning for follow-up for DM2, dx in 2003, insulin-dependent since 2022, uncontrolled, with complications (CKD stage 4). Pt. previously saw Dr. Kassie, but last visit with me 4 months ago.  Interim history: No increased urination, blurry vision, nausea, chest pain.  Reviewed HbA1c: 08/10/2023: HbA1c 6.6% ([facility]) Lab Results  Component Value Date   HGBA1C 7.6 (A) 05/18/2023   HGBA1C 7.2 (A) 12/26/2022   HGBA1C 11.3 (A) 10/25/2021   HGBA1C 9.6 (H) 06/16/2021   HGBA1C 12.4 (A) 03/18/2021   HGBA1C 10.8 (A) 11/05/2020   HGBA1C 12.8 (H) 09/21/2020   HGBA1C 7.8 (H) 03/29/2020   HGBA1C 7.6 (A) 12/01/2019   HGBA1C 7.7 (H) 09/09/2019    Pt is on a regimen of: - Jardiance 10 mg before b'fast. (Changed by nephrology) - Novolog  6-10 >> 14 units 3x a day, before meals (not 15 min before) >> 8-16 units 15 minutes before each meal + If you have a snack, you may need NovoLog  5-6 units before the snack. - Basaglar  14 units-added 10/2021 >> 22 >> 26 units at night She had a rash with Byetta. She had nausea and vomiting with Rybelsus . We stopped glipizide  XL 2.5 mg 10/2021 She was on Actos  30 mg daily before changing to Jardiance in 2024.  Pt checks her sugars 4x a day and they are:  Prev.:  Previously:   Lowest sugar was 59 >> 50s >> 50s; she has hypoglycemia awareness at 70.  Highest sugar was 449 >> 200s.  Glucometer: Accu-Chek  - + CKD  - stage 4 - on the renal transplant list at [facility], last BUN/creatinine:   12/22/2022: 31/2.51, GFR 21, glucose 40 Lab Results  Component Value Date   BUN 41 (H) 06/16/2021   BUN 34 (A) 01/26/2021   CREATININE 2.85 (H) 06/16/2021   CREATININE 2.3 (A) 01/26/2021  She is not on ACE inhibitor/ARB.  -+ HL; last set of lipids: 11/02/2022: 164/153/47/86 Lab Results  Component Value Date   CHOL 161 06/16/2021   HDL 49 (L)  06/16/2021   LDLCALC 85 06/16/2021   TRIG 176 (H) 06/16/2021   CHOLHDL 3.3 06/16/2021  She is on Lipitor 20 mg daily.  - last eye exam was in 01/2023: No DR reportedly. + incipient cataract. + small angle glaucoma. She has a separate BCBS 2020 eye insurance. Dr Dingledein.  - no numbness and tingling in her feet.  Last foot exam 10/12/2022.  She also has a history of HTN, OSA, anemia, GERD, PVCs, anxiety.  ROS: + see HPI  Past Medical History:  Diagnosis Date   Abnormal vaginal Pap smear    Acne rosacea    Adenomatous colon polyp 11/21/2012   Allergy    Anemia    Anxiety    Asthma with COPD with status asthmaticus (HCC)    Chronic kidney disease    Pt is on transplant list at [facility]   Depression    resolved   Diabetes mellitus    Dysrhythmia    pvc   GERD (gastroesophageal reflux disease)    Hot flashes    Hyperlipidemia    preventative meds per pt    Hypertension     resolved,     dr hochrein   Migraines    PVC (premature ventricular contraction)    Sleep apnea    workup still in process   Past Surgical  History:  Procedure Laterality Date   ABDOMINAL HYSTERECTOMY     COLONOSCOPY     POLYPECTOMY     SHOULDER ARTHROSCOPY  06/08/2011   Procedure: ARTHROSCOPY SHOULDER;  Surgeon: Franky CHRISTELLA Pointer, MD;  Location: [facility] OR;  Service: Orthopedics;  Laterality: Left;  MUA, LOA, SAD, DCR   Social History   Socioeconomic History   Marital status: Divorced    Spouse name: Not on file   Number of children: 1   Years of education: Not on file   Highest education level: Master's degree (e.g., MA, MS, MEng, MEd, MSW, MBA)  Occupational History   Occupation: PT    Employer: [employer]   Occupation: retired  Tobacco Use   Smoking status: Never   Smokeless tobacco: Never  Vaping Use   Vaping status: Never Used  Substance and Sexual Activity   Alcohol use: Never    Comment: twice a year mixed drink   Drug use: Never   Sexual activity: Not Currently    Birth  control/protection: Surgical  Other Topics Concern   Not on file  Social History Narrative   Social history: She is divorced.  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  Does not smoke or consume alcohol.  She is now caring for her elderly parents in their home which is stressful.  However she says it is less stressful now that she is living with them.       Family history: Brother with history of hypertrophic cardiomyopathy.  1 sister in good health.  Mother with history of stroke, heart disease, kidney stones as well as diabetes.  Father with history of diabetes.   Social Drivers of Corporate investment banker Strain: Low Risk  (08/06/2023)   Overall Financial Resource Strain (CARDIA)    Difficulty of Paying Living Expenses: Not hard at all  Food Insecurity: No Food Insecurity (08/06/2023)   Hunger Vital Sign    Worried About Running Out of Food in the Last Year: Never true    Ran Out of Food in the Last Year: Never true  Transportation Needs: No Transportation Needs (08/06/2023)   PRAPARE - Administrator, Civil Service (Medical): No    Lack of Transportation (Non-Medical): No  Physical Activity: Inactive (08/06/2023)   Exercise Vital Sign    Days of Exercise per Week: 0 days    Minutes of Exercise per Session: Not on file  Stress: Stress Concern Present (08/06/2023)   [employer] of Occupational Health - Occupational Stress Questionnaire    Feeling of Stress: Rather much  Social Connections: Socially Isolated (08/06/2023)   Social Connection and Isolation Panel    Frequency of Communication with Friends and Family: More than three times a week    Frequency of Social Gatherings with Friends and Family: Once a week    Attends Religious Services: Never    Database administrator or Organizations: No    Attends Engineer, structural: Not on file    Marital Status: Divorced  Catering manager Violence: Not on file    Current Outpatient Medications on File Prior to Visit  Medication Sig Dispense Refill   albuterol  (VENTOLIN  HFA) 108 (90 Base) MCG/ACT inhaler INHALE TWO PUFFS BY MOUTH EVERY 4 TO 6 HOURS AS NEEDED FOR COUGH OR WHEEZING 18 g 1   aspirin EC 81 MG tablet Take 81 mg by mouth daily. Swallow whole.     atorvastatin  (  LIPITOR) 20 MG tablet Take 1 tablet (20 mg total) by mouth daily. for cholesterol. 90 tablet 1   buPROPion  (WELLBUTRIN  XL) 300 MG 24 hr tablet Take 1 tablet (300 mg total) by mouth daily. For anxiety/depression 90 tablet 1   Calcium -Vitamin D  600-200 MG-UNIT per tablet Take 1 tablet by mouth daily.     cetirizine (ZYRTEC) 10 MG tablet Take 10 mg by mouth daily.     Cholecalciferol (VITAMIN D3) 125 MCG (5000 UT) CAPS Take 1 capsule by mouth daily in the afternoon.     Continuous Glucose Sensor (FREESTYLE LIBRE 2 SENSOR) MISC APPLY SENSOR TO SKIN FOR CONTINUOUS BLOOD SUGAR MONITORING, REPLACE EVERY 14 DAYS 6 each 2   empagliflozin (JARDIANCE) 10 MG TABS tablet Take by mouth daily.     ferrous fumarate (HEMOCYTE - 106 MG FE) 325 (106 FE) MG TABS Take 1 tablet by mouth daily.     insulin aspart  (NOVOLOG  FLEXPEN) 100 UNIT/ML FlexPen Inject 8-16 Units into the skin 3 (three) times daily before meals. 30 mL 3   Insulin Glargine  (BASAGLAR  KWIKPEN) 100 UNIT/ML INJECT 26 UNITS INTO THE SKIN AT BEDTIME 15 mL 2   Insulin Pen Needle (DROPLET PEN NEEDLES) 31G X 8 MM MISC USE AS DIRECTED 100 each 5   montelukast  (SINGULAIR ) 10 MG tablet TAKE 1 TABLET BY MOUTH EVERY MORNING 30 tablet 5   multivitamin (THERAGRAN) per tablet Take 1 tablet by mouth daily.     pantoprazole  (PROTONIX ) 20 MG tablet TAKE 1 TABLET BY MOUTH DAILY FOR HEARTBURN 90 tablet 0   venlafaxine  XR (EFFEXOR -XR) 150 MG 24 hr capsule Take 1 capsule (150 mg total) by mouth daily with breakfast. for anxiety and depression. 90 capsule 2   venlafaxine  XR (EFFEXOR -XR) 75 MG 24 hr capsule Take 1 capsule (75 mg total) by mouth daily. for anxiety  and depression. 90 capsule 2   No current facility-administered medications on file prior to visit.   Allergies  Allergen Reactions   Clarithromycin Hives   Vicodin [Hydrocodone -Acetaminophen ] Nausea And Vomiting   Byetta 10 Mcg Pen [Exenatide]    Exenatide Other (See Comments)    Rash & itching at injection site   Family History  Problem Relation Age of Onset   Heart disease Mother 17       CAD   Asthma Mother    Dementia Mother    Stroke Mother    Breast cancer Mother    Diabetes Mother    Heart Problems Father    Diabetes Father    Kidney failure Father    Asthma Sister    Breast cancer Maternal Grandmother    Colon cancer Neg Hx    Esophageal cancer Neg Hx    Rectal cancer Neg Hx    Stomach cancer Neg Hx    Colon polyps Neg Hx    PE: There were no vitals taken for this visit. Wt Readings from Last 3 Encounters:  05/18/23 228 lb 3.2 oz (103.5 kg)  02/12/23 230 lb (104.3 kg)  12/26/22 226 lb 9.6 oz (102.8 kg)   Constitutional: overweight, in NAD Eyes: no exophthalmos ENT:  no thyromegaly, no cervical lymphadenopathy Cardiovascular: Tachycardia, RR, No MRG Respiratory: CTA B Musculoskeletal: no deformities Skin: no rashes Neurological: +  tremor with outstretched hands  ASSESSMENT: 1. DM2, insulin-dependent, uncontrolled, with complications - stage 4 CKD  2. HL  PLAN:  1. Patient with longstanding, uncontrolled, type 2 diabetes, on SGLT2 inhibitor along with basal-bolus insulin, with significant improvement in control  after starting long-acting insulin (HbA1c decreased from 11.3% to 7.2%). At last OV, HbA1c was 7.3% but she had a 6.6% 1.5 mo ago.   - At last OV, sugars were fluctuating mainly within the target range but increasing after her snack at night then dropping afterwards when she was correcting the high blood sugar after the snack and then bouncing up again after this drop.  We discussed about trying to avoid a snack but if she still decided to eat  this, to cover it with 5-6 units of NovoLog  sugars were also occasionally lower after lunch and we discussed about wearing the dose of NovoLog  more CGM interpretation: -At today's visit, we reviewed her CGM downloads: It appears that *** of values are in target range (goal >70%), while *** are higher than 180 (goal <25%), and *** are lower than 70 (goal <4%).  The calculated average blood sugar is ***.  The projected HbA1c for the next 3 months (GMI) is ***. -Reviewing the CGM trends, ***  - I suggested to:  Patient Instructions  Please continue: - Jardiance 10 mg daily in am - Basaglar  26 units at bedtime  - NovoLog  8-16 units 15 min before each main meal  If you have a snack, you may need NovoLog  5-6 units before the snack.  Please return in 3-4 months.   - she believes she would need an HbA1c lower than 7 to qualify for transplant - advised to check sugars at different times of the day - 4x a day, rotating check times - advised for yearly eye exams >> she is UTD - return to clinic in 3-4 months  2. HL - Reviewed latest lipid panel from 10/2022: LDL above our target of less than 70, TG normal, HDL slightly low - Continues Lipitor 10 mg daily - no SEs  Lela Fendt, MD PhD [facility] Endocrinology

## 2023-09-27 ENCOUNTER — Ambulatory Visit: Admitting: Internal Medicine

## 2023-09-27 NOTE — Progress Notes (Deleted)
 Patient ID: Samantha Hahn, female   DOB: 08/27/1960, 63 y.o.   MRN: [id number] This note was precharted 09/21/2023.  HPI: Samantha Hahn is a 63 y.o.-year-old female, returning for follow-up for DM2, dx in 2003, insulin-dependent since 2022, uncontrolled, with complications (CKD stage 4). Pt. previously saw Dr. Kassie, but last visit with me 4 months ago.  Interim history: No increased urination, blurry vision, nausea, chest pain. She is on the renal transplant list at [facility].  Reviewed HbA1c: 08/10/2023: HbA1c 6.6% ([facility]) Lab Results  Component Value Date   HGBA1C 7.6 (A) 05/18/2023   HGBA1C 7.2 (A) 12/26/2022   HGBA1C 11.3 (A) 10/25/2021   HGBA1C 9.6 (H) 06/16/2021   HGBA1C 12.4 (A) 03/18/2021   HGBA1C 10.8 (A) 11/05/2020   HGBA1C 12.8 (H) 09/21/2020   HGBA1C 7.8 (H) 03/29/2020   HGBA1C 7.6 (A) 12/01/2019   HGBA1C 7.7 (H) 09/09/2019    Pt is on a regimen of: - Jardiance 10 mg before b'fast. (Changed by nephrology) - Novolog  6-10 >> 14 units 3x a day, before meals (not 15 min before) >> 8-16 units 15 minutes before each meal + If you have a snack, you may need NovoLog  5-6 units before the snack. - Basaglar  14 units-added 10/2021 >> 22 >> 26 units at night She had a rash with Byetta. She had nausea and vomiting with Rybelsus . We stopped glipizide  XL 2.5 mg 10/2021 She was on Actos  30 mg daily before changing to Jardiance in 2024.  Pt checks her sugars 4x a day and they are:  Prev.:  Previously:   Lowest sugar was 59 >> 50s >> 50s; she has hypoglycemia awareness at 70.  Highest sugar was 449 >> 200s.  Glucometer: Accu-Chek  - + CKD  - stage 4 - on the renal transplant list at [facility], last BUN/creatinine:  08/10/2023: Creatinine 2.9  12/22/2022: 31/2.51, GFR 21, glucose 40 Lab Results  Component Value Date   BUN 41 (H) 06/16/2021   BUN 34 (A) 01/26/2021   CREATININE 2.85 (H) 06/16/2021   CREATININE 2.3 (A) 01/26/2021  She is not on ACE inhibitor/ARB.  -+ HL; last set of  lipids: 11/02/2022: 164/153/47/86 Lab Results  Component Value Date   CHOL 161 06/16/2021   HDL 49 (L) 06/16/2021   LDLCALC 85 06/16/2021   TRIG 176 (H) 06/16/2021   CHOLHDL 3.3 06/16/2021  She is on Lipitor 20 mg daily.  - last eye exam was in 01/2023: No DR reportedly. + incipient cataract. + small angle glaucoma. She has a separate BCBS 2020 eye insurance. Dr Dingledein.  - no numbness and tingling in her feet.  Last foot exam 10/12/2022.  She also has a history of HTN, OSA, anemia, GERD, PVCs, anxiety.  ROS: + see HPI  Past Medical History:  Diagnosis Date   Abnormal vaginal Pap smear    Acne rosacea    Adenomatous colon polyp 11/21/2012   Allergy    Anemia    Anxiety    Asthma with COPD with status asthmaticus (HCC)    Chronic kidney disease    Pt is on transplant list at [facility]   Depression    resolved   Diabetes mellitus    Dysrhythmia    pvc   GERD (gastroesophageal reflux disease)    Hot flashes    Hyperlipidemia    preventative meds per pt    Hypertension     resolved,     dr hochrein   Migraines    PVC (premature ventricular  contraction)    Sleep apnea    workup still in process   Past Surgical History:  Procedure Laterality Date   ABDOMINAL HYSTERECTOMY     COLONOSCOPY     POLYPECTOMY     SHOULDER ARTHROSCOPY  06/08/2011   Procedure: ARTHROSCOPY SHOULDER;  Surgeon: Franky CHRISTELLA Pointer, MD;  Location: [facility] OR;  Service: Orthopedics;  Laterality: Left;  MUA, LOA, SAD, DCR   Social History   Socioeconomic History   Marital status: Divorced    Spouse name: Not on file   Number of children: 1   Years of education: Not on file   Highest education level: Master's degree (e.g., MA, MS, MEng, MEd, MSW, MBA)  Occupational History   Occupation: PT    Employer: [employer]   Occupation: retired  Tobacco Use   Smoking status: Never   Smokeless tobacco: Never  Vaping Use   Vaping status: Never Used  Substance and Sexual Activity   Alcohol use: Never     Comment: twice a year mixed drink   Drug use: Never   Sexual activity: Not Currently    Birth control/protection: Surgical  Other Topics Concern   Not on file  Social History Narrative   Social history: She is divorced.  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  Does not smoke or consume alcohol.  She is now caring for her elderly parents in their home which is stressful.  However she says it is less stressful now that she is living with them.       Family history: Brother with history of hypertrophic cardiomyopathy.  1 sister in good health.  Mother with history of stroke, heart disease, kidney stones as well as diabetes.  Father with history of diabetes.   Social Drivers of Corporate investment banker Strain: Low Risk  (08/06/2023)   Overall Financial Resource Strain (CARDIA)    Difficulty of Paying Living Expenses: Not hard at all  Food Insecurity: No Food Insecurity (08/06/2023)   Hunger Vital Sign    Worried About Running Out of Food in the Last Year: Never true    Ran Out of Food in the Last Year: Never true  Transportation Needs: No Transportation Needs (08/06/2023)   PRAPARE - Administrator, Civil Service (Medical): No    Lack of Transportation (Non-Medical): No  Physical Activity: Inactive (08/06/2023)   Exercise Vital Sign    Days of Exercise per Week: 0 days    Minutes of Exercise per Session: Not on file  Stress: Stress Concern Present (08/06/2023)   [employer] of Occupational Health - Occupational Stress Questionnaire    Feeling of Stress: Rather much  Social Connections: Socially Isolated (08/06/2023)   Social Connection and Isolation Panel    Frequency of Communication with Friends and Family: More than three times a week    Frequency of Social Gatherings with Friends and Family: Once a week    Attends Religious Services: Never    Database administrator or Organizations: No    Attends Museum/gallery exhibitions officer: Not on file    Marital Status: Divorced  Catering manager Violence: Not on file   Current Outpatient Medications on File Prior to Visit  Medication Sig Dispense Refill   albuterol  (VENTOLIN  HFA) 108 (90 Base) MCG/ACT inhaler INHALE TWO PUFFS BY MOUTH EVERY 4 TO 6 HOURS AS NEEDED FOR COUGH OR WHEEZING 18 g 1   aspirin  EC 81 MG tablet Take 81 mg by mouth daily. Swallow whole.     atorvastatin  (LIPITOR) 20 MG tablet Take 1 tablet (20 mg total) by mouth daily. for cholesterol. 90 tablet 1   buPROPion  (WELLBUTRIN  XL) 300 MG 24 hr tablet Take 1 tablet (300 mg total) by mouth daily. For anxiety/depression 90 tablet 1   Calcium -Vitamin D  600-200 MG-UNIT per tablet Take 1 tablet by mouth daily.     cetirizine (ZYRTEC) 10 MG tablet Take 10 mg by mouth daily.     Cholecalciferol (VITAMIN D3) 125 MCG (5000 UT) CAPS Take 1 capsule by mouth daily in the afternoon.     Continuous Glucose Sensor (FREESTYLE LIBRE 2 SENSOR) MISC APPLY SENSOR TO SKIN FOR CONTINUOUS BLOOD SUGAR MONITORING, REPLACE EVERY 14 DAYS 6 each 2   empagliflozin (JARDIANCE) 10 MG TABS tablet Take by mouth daily.     ferrous fumarate (HEMOCYTE - 106 MG FE) 325 (106 FE) MG TABS Take 1 tablet by mouth daily.     insulin aspart  (NOVOLOG  FLEXPEN) 100 UNIT/ML FlexPen Inject 8-16 Units into the skin 3 (three) times daily before meals. 30 mL 3   Insulin Glargine  (BASAGLAR  KWIKPEN) 100 UNIT/ML INJECT 26 UNITS INTO THE SKIN AT BEDTIME 15 mL 2   Insulin Pen Needle (DROPLET PEN NEEDLES) 31G X 8 MM MISC USE AS DIRECTED 100 each 5   montelukast  (SINGULAIR ) 10 MG tablet TAKE 1 TABLET BY MOUTH EVERY MORNING 30 tablet 5   multivitamin (THERAGRAN) per tablet Take 1 tablet by mouth daily.     pantoprazole  (PROTONIX ) 20 MG tablet TAKE 1 TABLET BY MOUTH DAILY FOR HEARTBURN 90 tablet 0   venlafaxine  XR (EFFEXOR -XR) 150 MG 24 hr capsule Take 1 capsule (150 mg total) by mouth daily with breakfast. for anxiety and depression. 90 capsule 2    venlafaxine  XR (EFFEXOR -XR) 75 MG 24 hr capsule Take 1 capsule (75 mg total) by mouth daily. for anxiety and depression. 90 capsule 2   No current facility-administered medications on file prior to visit.   Allergies  Allergen Reactions   Clarithromycin Hives   Vicodin [Hydrocodone -Acetaminophen ] Nausea And Vomiting   Byetta 10 Mcg Pen [Exenatide]    Exenatide Other (See Comments)    Rash & itching at injection site   Family History  Problem Relation Age of Onset   Heart disease Mother 37       CAD   Asthma Mother    Dementia Mother    Stroke Mother    Breast cancer Mother    Diabetes Mother    Heart Problems Father    Diabetes Father    Kidney failure Father    Asthma Sister    Breast cancer Maternal Grandmother    Colon cancer Neg Hx    Esophageal cancer Neg Hx    Rectal cancer Neg Hx    Stomach cancer Neg Hx    Colon polyps Neg Hx    PE: There were no vitals taken for this visit. Wt Readings from Last 3 Encounters:  05/18/23 228 lb 3.2 oz (103.5 kg)  02/12/23 230 lb (104.3 kg)  12/26/22 226 lb 9.6 oz (102.8 kg)   Constitutional: overweight, in NAD Eyes: no exophthalmos ENT:  no thyromegaly, no cervical lymphadenopathy Cardiovascular: Tachycardia, RR, No MRG Respiratory: CTA B Musculoskeletal: no deformities Skin: no rashes Neurological: +  tremor with outstretched hands  ASSESSMENT: 1. DM2, insulin-dependent, uncontrolled, with complications - stage 4 CKD  2. HL  PLAN:  1. Patient with  longstanding, uncontrolled, type 2 diabetes, on SGLT2 inhibitor along with basal-bolus insulin, with significant improvement in control after starting long-acting insulin (HbA1c decreased from 11.3% to 7.2%). At last OV, HbA1c was 7.3% but she had another HbA1c which was better, 6.6% on 08/10/2023. - At last OV, sugars were fluctuating mainly within the target range but increasing after her snack at night then dropping afterwards when she was correcting the high blood sugar  after the snack and then bouncing up again after this drop.  We discussed about trying to avoid a snack but if she still decided to eat this, to cover it with 5-6 units of NovoLog  sugars were also occasionally lower after lunch and we discussed about wearing the dose of NovoLog  more CGM interpretation: -At today's visit, we reviewed her CGM downloads: It appears that *** of values are in target range (goal >70%), while *** are higher than 180 (goal <25%), and *** are lower than 70 (goal <4%).  The calculated average blood sugar is ***.  The projected HbA1c for the next 3 months ([facility]MI) is ***. -Reviewing the CGM trends, ***  - I suggested to:  Patient Instructions  Please continue: - Jardiance 10 mg daily in am - Basaglar  26 units at bedtime  - NovoLog  8-16 units 15 min before each main meal  If you have a snack, you may need NovoLog  5-6 units before the snack.  Please return in 3-4 months.    - HbA1c needs to be lower than 7% to qualify for transplant -with the latest HbA1c being 6.6%, she now qualifies for this. - advised to check sugars at different times of the day - 4x a day, rotating check times - advised for yearly eye exams >> she is UTD - return to clinic in 3-4 months  2. HL - Latest lipid panel was reviewed from 10/2022: LDL above our target of less than 70, triglycerides normal, HDL slightly low   - She continues Lipitor 10 mg daily without side effects  Lela Fendt, MD PhD [facility] Endocrinology

## 2023-10-02 ENCOUNTER — Telehealth: Payer: Self-pay | Admitting: Primary Care

## 2023-10-02 NOTE — Telephone Encounter (Signed)
 Please call patient:  We received notification from [facility] transplant [facility] that she needs a few vaccines before her transplant.  Reviewed documented vaccines and she will need:  Seasonal influenza vaccine Prevnar 20 Hepatitis A Second Shingrix vaccine MMR (unless she had already had this) RSV and COVID-19 vaccines.  We can provide most of these except for the RSV and COVID-19 vaccines for which she will need to get at the pharmacy.  Does she want to complete her vaccine with us  or if she following elsewhere?  Okay to schedule nurse visits. They will need to be at least 2 weeks apart.   Hep A and Shingrix can be administered on the same day.  Prevnar 20 and influenza vaccines can be administered on another day together.   MMR given on another day if needed.     Samantha Hahn

## 2023-10-02 NOTE — Telephone Encounter (Signed)
 Unable to reach patient. Left voicemail to return call to our office.

## 2023-10-04 NOTE — Telephone Encounter (Signed)
 Unable to reach patient. Left voicemail to return call to our office.

## 2023-10-08 NOTE — Telephone Encounter (Signed)
 Spoke with patient, advised of the below message. Pt states she had MMR vaccine as a child. Pt states she has had RSV and Covid vaccines.  Patient would like to get these at the pharmacy if available since it is closer for her. She will call and check.  Scheduled CPE per patient request. Advised patient we can do two of the vaccines at that time if pharmacy is unable to provide some of them.  She will provide documentation for all vaccines to send to [facility] once available.

## 2023-10-09 ENCOUNTER — Ambulatory Visit: Admitting: Internal Medicine

## 2023-10-09 NOTE — Telephone Encounter (Signed)
 Noted

## 2023-10-09 NOTE — Progress Notes (Deleted)
 Patient ID: Samantha Hahn, female   DOB: [birth year], 63 y.o.   MRN: [id number] This note was precharted 09/21/2023 and 09/27/2023.  HPI: Samantha Hahn is a 63 y.o.-year-old female, returning for follow-up for DM2, dx in 2003, insulin-dependent since 2022, uncontrolled, with complications (CKD stage 4). Pt. previously saw Dr. Kassie, but last visit with me 4 months ago.  Interim history: No increased urination, blurry vision, nausea, chest pain. She is on the renal transplant list at [facility].  Reviewed HbA1c: 08/10/2023: HbA1c 6.6% ([facility]) Lab Results  Component Value Date   HGBA1C 7.6 (A) 05/18/2023   HGBA1C 7.2 (A) 12/26/2022   HGBA1C 11.3 (A) 10/25/2021   HGBA1C 9.6 (H) 06/16/2021   HGBA1C 12.4 (A) 03/18/2021   HGBA1C 10.8 (A) 11/05/2020   HGBA1C 12.8 (H) 09/21/2020   HGBA1C 7.8 (H) 03/29/2020   HGBA1C 7.6 (A) 12/01/2019   HGBA1C 7.7 (H) 09/09/2019    Pt is on a regimen of: - Jardiance 10 mg before b'fast. (Changed by nephrology) - Novolog  6-10 >> 14 units 3x a day, before meals (not 15 min before) >> 8-16 units 15 minutes before each meal + If you have a snack, you may need NovoLog  5-6 units before the snack. - Basaglar  14 units-added 10/2021 >> 22 >> 26 units at night She had a rash with Byetta. She had nausea and vomiting with Rybelsus . We stopped glipizide  XL 2.5 mg 10/2021 She was on Actos  30 mg daily before changing to Jardiance in 2024.  Pt checks her sugars 4x a day and they are:  Prev.:  Prev.:   Lowest sugar was 59 >> 50s >> 50s; she has hypoglycemia awareness at 70.  Highest sugar was 449 >> 200s.  Glucometer: Accu-Chek  - + CKD  - stage 4 - on the renal transplant list at [facility], last BUN/creatinine:  08/10/2023: Creatinine 2.9  12/22/2022: 31/2.51, GFR 21, glucose 40 Lab Results  Component Value Date   BUN 41 (H) 06/16/2021   BUN 34 (A) 01/26/2021   CREATININE 2.85 (H) 06/16/2021   CREATININE 2.3 (A) 01/26/2021  She is not on ACE inhibitor/ARB.  -+ HL; last  set of lipids: 11/02/2022: 164/153/47/86 Lab Results  Component Value Date   CHOL 161 06/16/2021   HDL 49 (L) 06/16/2021   LDLCALC 85 06/16/2021   TRIG 176 (H) 06/16/2021   CHOLHDL 3.3 06/16/2021  She is on Lipitor 20 mg daily.  - last eye exam was in 01/2023: No DR reportedly. + incipient cataract. + small angle glaucoma. She has a separate BCBS 2020 eye insurance. Dr Dingledein.  - no numbness and tingling in her feet.  Last foot exam 10/12/2022.  She also has a history of HTN, OSA, anemia, GERD, PVCs, anxiety.  ROS: + see HPI  Past Medical History:  Diagnosis Date   Abnormal vaginal Pap smear    Acne rosacea    Adenomatous colon polyp 11/21/2012   Allergy    Anemia    Anxiety    Asthma with COPD with status asthmaticus (HCC)    Chronic kidney disease    Pt is on transplant list at [facility]   Depression    resolved   Diabetes mellitus    Dysrhythmia    pvc   GERD (gastroesophageal reflux disease)    Hot flashes    Hyperlipidemia    preventative meds per pt    Hypertension     resolved,     dr hochrein   Migraines    PVC (  premature ventricular contraction)    Sleep apnea    workup still in process   Past Surgical History:  Procedure Laterality Date   ABDOMINAL HYSTERECTOMY     COLONOSCOPY     POLYPECTOMY     SHOULDER ARTHROSCOPY  06/08/2011   Procedure: ARTHROSCOPY SHOULDER;  Surgeon: Franky CHRISTELLA Pointer, MD;  Location: [facility] OR;  Service: Orthopedics;  Laterality: Left;  MUA, LOA, SAD, DCR   Social History   Socioeconomic History   Marital status: Divorced    Spouse name: Not on file   Number of children: 1   Years of education: Not on file   Highest education level: Master's degree (e.g., MA, MS, MEng, MEd, MSW, MBA)  Occupational History   Occupation: PT    Employer: [employer]   Occupation: retired  Tobacco Use   Smoking status: Never   Smokeless tobacco: Never  Vaping Use   Vaping status: Never Used  Substance and Sexual Activity   Alcohol use: Never     Comment: twice a year mixed drink   Drug use: Never   Sexual activity: Not Currently    Birth control/protection: Surgical  Other Topics Concern   Not on file  Social History Narrative   Social history: She is divorced.  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  Does not smoke or consume alcohol.  She is now caring for her elderly parents in their home which is stressful.  However she says it is less stressful now that she is living with them.       Family history: Brother with history of hypertrophic cardiomyopathy.  1 sister in good health.  Mother with history of stroke, heart disease, kidney stones as well as diabetes.  Father with history of diabetes.   Social Drivers of Corporate investment banker Strain: Low Risk  (08/06/2023)   Overall Financial Resource Strain (CARDIA)    Difficulty of Paying Living Expenses: Not hard at all  Food Insecurity: No Food Insecurity (08/06/2023)   Hunger Vital Sign    Worried About Running Out of Food in the Last Year: Never true    Ran Out of Food in the Last Year: Never true  Transportation Needs: No Transportation Needs (08/06/2023)   PRAPARE - Administrator, Civil Service (Medical): No    Lack of Transportation (Non-Medical): No  Physical Activity: Inactive (08/06/2023)   Exercise Vital Sign    Days of Exercise per Week: 0 days    Minutes of Exercise per Session: Not on file  Stress: Stress Concern Present (08/06/2023)   [employer] of Occupational Health - Occupational Stress Questionnaire    Feeling of Stress: Rather much  Social Connections: Socially Isolated (08/06/2023)   Social Connection and Isolation Panel    Frequency of Communication with Friends and Family: More than three times a week    Frequency of Social Gatherings with Friends and Family: Once a week    Attends Religious Services: Never    Database administrator or Organizations: No    Attends Museum/gallery exhibitions officer: Not on file    Marital Status: Divorced  Catering manager Violence: Not on file   Current Outpatient Medications on File Prior to Visit  Medication Sig Dispense Refill   albuterol  (VENTOLIN  HFA) 108 (90 Base) MCG/ACT inhaler INHALE TWO PUFFS BY MOUTH EVERY 4 TO 6 HOURS AS NEEDED FOR COUGH OR WHEEZING 18 g 1  aspirin EC 81 MG tablet Take 81 mg by mouth daily. Swallow whole.     atorvastatin  (LIPITOR) 20 MG tablet Take 1 tablet (20 mg total) by mouth daily. for cholesterol. 90 tablet 1   buPROPion  (WELLBUTRIN  XL) 300 MG 24 hr tablet Take 1 tablet (300 mg total) by mouth daily. For anxiety/depression 90 tablet 1   Calcium -Vitamin D  600-200 MG-UNIT per tablet Take 1 tablet by mouth daily.     cetirizine (ZYRTEC) 10 MG tablet Take 10 mg by mouth daily.     Cholecalciferol (VITAMIN D3) 125 MCG (5000 UT) CAPS Take 1 capsule by mouth daily in the afternoon.     Continuous Glucose Sensor (FREESTYLE LIBRE 2 SENSOR) MISC APPLY SENSOR TO SKIN FOR CONTINUOUS BLOOD SUGAR MONITORING, REPLACE EVERY 14 DAYS 6 each 2   empagliflozin (JARDIANCE) 10 MG TABS tablet Take by mouth daily.     ferrous fumarate (HEMOCYTE - 106 MG FE) 325 (106 FE) MG TABS Take 1 tablet by mouth daily.     insulin aspart  (NOVOLOG  FLEXPEN) 100 UNIT/ML FlexPen Inject 8-16 Units into the skin 3 (three) times daily before meals. 30 mL 3   Insulin Glargine  (BASAGLAR  KWIKPEN) 100 UNIT/ML INJECT 26 UNITS INTO THE SKIN AT BEDTIME 15 mL 2   Insulin Pen Needle (DROPLET PEN NEEDLES) 31G X 8 MM MISC USE AS DIRECTED 100 each 5   montelukast  (SINGULAIR ) 10 MG tablet TAKE 1 TABLET BY MOUTH EVERY MORNING 30 tablet 5   multivitamin (THERAGRAN) per tablet Take 1 tablet by mouth daily.     pantoprazole  (PROTONIX ) 20 MG tablet TAKE 1 TABLET BY MOUTH DAILY FOR HEARTBURN 90 tablet 0   venlafaxine  XR (EFFEXOR -XR) 150 MG 24 hr capsule Take 1 capsule (150 mg total) by mouth daily with breakfast. for anxiety and depression. 90 capsule 2    venlafaxine  XR (EFFEXOR -XR) 75 MG 24 hr capsule Take 1 capsule (75 mg total) by mouth daily. for anxiety and depression. 90 capsule 2   No current facility-administered medications on file prior to visit.   Allergies  Allergen Reactions   Clarithromycin Hives   Vicodin [Hydrocodone -Acetaminophen ] Nausea And Vomiting   Byetta 10 Mcg Pen [Exenatide]    Exenatide Other (See Comments)    Rash & itching at injection site   Family History  Problem Relation Age of Onset   Heart disease Mother 26       CAD   Asthma Mother    Dementia Mother    Stroke Mother    Breast cancer Mother    Diabetes Mother    Heart Problems Father    Diabetes Father    Kidney failure Father    Asthma Sister    Breast cancer Maternal Grandmother    Colon cancer Neg Hx    Esophageal cancer Neg Hx    Rectal cancer Neg Hx    Stomach cancer Neg Hx    Colon polyps Neg Hx    PE: There were no vitals taken for this visit. Wt Readings from Last 3 Encounters:  05/18/23 228 lb 3.2 oz (103.5 kg)  02/12/23 230 lb (104.3 kg)  12/26/22 226 lb 9.6 oz (102.8 kg)   Constitutional: overweight, in NAD Eyes: no exophthalmos ENT:  no thyromegaly, no cervical lymphadenopathy Cardiovascular: Tachycardia, RR, No MRG Respiratory: CTA B Musculoskeletal: no deformities Skin: no rashes Neurological: +  tremor with outstretched hands  ASSESSMENT: 1. DM2, insulin-dependent, uncontrolled, with complications - stage 4 CKD  2. HL  PLAN:  1. Patient  with longstanding, uncontrolled, type 2 diabetes, on SGLT2 inhibitor along with basal-bolus insulin, with significant improvement in control after starting long-acting insulin (HbA1c decreased from 11.3% to 7.2%). At last OV, HbA1c was 7.3% but she had another HbA1c which was better, 6.6% on 08/10/2023. - At last OV, sugars were fluctuating mainly within the target range but increasing after her snack at night then dropping afterwards when she was correcting the high blood sugar  after the snack and then bouncing up again after this drop.  We discussed about trying to avoid a snack but if she still decided to eat this, to cover it with 5-6 units of NovoLog  sugars were also occasionally lower after lunch and we discussed about wearing the dose of NovoLog  more CGM interpretation: -At today's visit, we reviewed her CGM downloads: It appears that *** of values are in target range (goal >70%), while *** are higher than 180 (goal <25%), and *** are lower than 70 (goal <4%).  The calculated average blood sugar is ***.  The projected HbA1c for the next 3 months (GMI) is ***. -Reviewing the CGM trends, ***   - I suggested to:  Patient Instructions  Please continue: - Jardiance 10 mg daily in am - Basaglar  26 units at bedtime  - NovoLog  8-16 units 15 min before each main meal  If you have a snack, you may need NovoLog  5-6 units before the snack.  Please return in 3-4 months.    - HbA1c needs to be lower than 7% to qualify for transplant -  the latest HbA1c of 6.6% qualified her for this - we checked her HbA1c: 7%  - advised to check sugars at different times of the day - 4x a day, rotating check times - advised for yearly eye exams >> she is UTD - return to clinic in 3-4 months  2. HL - Latest lipid panel was reviewed from 10/2022: LDL above our target of less than 70, triglycerides normal, HDL slightly low - She continues Lipitor 10 mg daily without side effects  Lela Fendt, MD PhD [facility] Endocrinology

## 2023-10-11 DIAGNOSIS — R000 Tachycardia, unspecified: Secondary | ICD-10-CM | POA: Diagnosis not present

## 2023-10-11 DIAGNOSIS — E782 Mixed hyperlipidemia: Secondary | ICD-10-CM | POA: Diagnosis not present

## 2023-10-11 DIAGNOSIS — R9439 Abnormal result of other cardiovascular function study: Secondary | ICD-10-CM | POA: Diagnosis not present

## 2023-10-25 ENCOUNTER — Other Ambulatory Visit: Payer: Self-pay | Admitting: Internal Medicine

## 2023-10-26 ENCOUNTER — Ambulatory Visit: Admitting: Internal Medicine

## 2023-10-26 ENCOUNTER — Encounter: Payer: Self-pay | Admitting: Internal Medicine

## 2023-10-26 MED ORDER — FREESTYLE LIBRE 2 PLUS SENSOR MISC: 1.0000 | 0 refills | Status: DC

## 2023-10-26 NOTE — Progress Notes (Deleted)
 Patient ID: Samantha Hahn, female   DOB: 27-Jul-1960, 63 y.o.   MRN: [id number] This note was precharted 09/21/2023, 09/27/2023, and 10/09/2023.  HPI: Samantha Hahn is a 63 y.o.-year-old female, returning for follow-up for DM2, dx in 2003, insulin-dependent since 2022, uncontrolled, with complications (CKD stage 4). Pt. previously saw Dr. Kassie, but last visit with me 4 months ago.  Interim history: No increased urination, blurry vision, nausea, chest pain. She is on the renal transplant list at [facility].  Reviewed HbA1c: 08/10/2023: HbA1c 6.6% ([facility]) Lab Results  Component Value Date   HGBA1C 7.6 (A) 05/18/2023   HGBA1C 7.2 (A) 12/26/2022   HGBA1C 11.3 (A) 10/25/2021   HGBA1C 9.6 (H) 06/16/2021   HGBA1C 12.4 (A) 03/18/2021   HGBA1C 10.8 (A) 11/05/2020   HGBA1C 12.8 (H) 09/21/2020   HGBA1C 7.8 (H) 03/29/2020   HGBA1C 7.6 (A) 12/01/2019   HGBA1C 7.7 (H) 09/09/2019    Pt is on a regimen of: - Jardiance 10 mg before b'fast. (Changed by nephrology) - Novolog  6-10 >> 14 units 3x a day, before meals (not 15 min before) >> 8-16 units 15 minutes before each meal + If you have a snack, you may need NovoLog  5-6 units before the snack. - Basaglar  14 units-added 10/2021 >> 22 >> 26 units at night She had a rash with Byetta. She had nausea and vomiting with Rybelsus . We stopped glipizide  XL 2.5 mg 10/2021 She was on Actos  30 mg daily before changing to Jardiance in 2024.  Pt checks her sugars 4x a day and they are:  Prev.:  Prev.:  Lowest sugar was 59 >> 50s >> 50s; she has hypoglycemia awareness at 70.  Highest sugar was 449 >> 200s.  Glucometer: Accu-Chek  - + CKD  - stage 4 -sees nephrology, on the renal transplant list at [facility], last BUN/creatinine:  08/10/2023: Creatinine 2.9  12/22/2022: 31/2.51, GFR 21, glucose 40 Lab Results  Component Value Date   BUN 41 (H) 06/16/2021   BUN 34 (A) 01/26/2021   CREATININE 2.85 (H) 06/16/2021   CREATININE 2.3 (A) 01/26/2021  She is not on ACE  inhibitor/ARB.  -+ HL; last set of lipids: 11/02/2022: 164/153/47/86 Lab Results  Component Value Date   CHOL 161 06/16/2021   HDL 49 (L) 06/16/2021   LDLCALC 85 06/16/2021   TRIG 176 (H) 06/16/2021   CHOLHDL 3.3 06/16/2021  She is on Lipitor 20 mg daily.  - last eye exam was in 01/2023: No DR reportedly. + incipient cataract. + small angle glaucoma. She has a separate [employer] 2020 eye insurance. Dr Dingledein.  - no numbness and tingling in her feet.  Last foot exam 10/12/2022.  She also has a history of HTN, OSA, anemia, GERD, PVCs, anxiety.  ROS: + see HPI  Past Medical History:  Diagnosis Date   Abnormal vaginal Pap smear    Acne rosacea    Adenomatous colon polyp 11/21/2012   Allergy    Anemia    Anxiety    Asthma with COPD with status asthmaticus (HCC)    Chronic kidney disease    Pt is on transplant list at [facility]   Depression    resolved   Diabetes mellitus    Dysrhythmia    pvc   GERD (gastroesophageal reflux disease)    Hot flashes    Hyperlipidemia    preventative meds per pt    Hypertension     resolved,     dr hochrein   Migraines  PVC (premature ventricular contraction)    Sleep apnea    workup still in process   Past Surgical History:  Procedure Laterality Date   ABDOMINAL HYSTERECTOMY     COLONOSCOPY     POLYPECTOMY     SHOULDER ARTHROSCOPY  06/08/2011   Procedure: ARTHROSCOPY SHOULDER;  Surgeon: Franky CHRISTELLA Pointer, MD;  Location: [facility] OR;  Service: Orthopedics;  Laterality: Left;  MUA, LOA, SAD, DCR   Social History   Socioeconomic History   Marital status: Divorced    Spouse name: Not on file   Number of children: 1   Years of education: Not on file   Highest education level: Master's degree (e.g., MA, MS, MEng, MEd, MSW, MBA)  Occupational History   Occupation: PT    Employer:    Occupation: retired  Tobacco Use   Smoking status: Never   Smokeless tobacco: Never  Vaping Use   Vaping status: Never Used  Substance and Sexual  Activity   Alcohol use: Never    Comment: twice a year mixed drink   Drug use: Never   Sexual activity: Not Currently    Birth control/protection: Surgical  Other Topics Concern   Not on file  Social History Narrative   Social history: She is divorced.  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  Does not smoke or consume alcohol.  She is now caring for her elderly parents in their home which is stressful.  However she says it is less stressful now that she is living with them.       Family history: Brother with history of hypertrophic cardiomyopathy.  1 sister in good health.  Mother with history of stroke, heart disease, kidney stones as well as diabetes.  Father with history of diabetes.   Social Drivers of Corporate investment banker Strain: Low Risk  (08/06/2023)   Overall Financial Resource Strain (CARDIA)    Difficulty of Paying Living Expenses: Not hard at all  Food Insecurity: No Food Insecurity (08/06/2023)   Hunger Vital Sign    Worried About Running Out of Food in the Last Year: Never true    Ran Out of Food in the Last Year: Never true  Transportation Needs: No Transportation Needs (08/06/2023)   PRAPARE - Administrator, Civil Service (Medical): No    Lack of Transportation (Non-Medical): No  Physical Activity: Inactive (08/06/2023)   Exercise Vital Sign    Days of Exercise per Week: 0 days    Minutes of Exercise per Session: Not on file  Stress: Stress Concern Present (08/06/2023)   [employer] of Occupational Health - Occupational Stress Questionnaire    Feeling of Stress: Rather much  Social Connections: Socially Isolated (08/06/2023)   Social Connection and Isolation Panel    Frequency of Communication with Friends and Family: More than three times a week    Frequency of Social Gatherings with Friends and Family: Once a week    Attends Religious Services: Never    Database administrator or  Organizations: No    Attends Engineer, structural: Not on file    Marital Status: Divorced  Catering manager Violence: Not on file   Current Outpatient Medications on File Prior to Visit  Medication Sig Dispense Refill   albuterol  (VENTOLIN  HFA) 108 (90 Base) MCG/ACT inhaler INHALE TWO PUFFS BY MOUTH EVERY 4 TO 6 HOURS AS NEEDED FOR COUGH OR WHEEZING 18 g 1  aspirin EC 81 MG tablet Take 81 mg by mouth daily. Swallow whole.     atorvastatin  (LIPITOR) 20 MG tablet Take 1 tablet (20 mg total) by mouth daily. for cholesterol. 90 tablet 1   buPROPion  (WELLBUTRIN  XL) 300 MG 24 hr tablet Take 1 tablet (300 mg total) by mouth daily. For anxiety/depression 90 tablet 1   Calcium -Vitamin D  600-200 MG-UNIT per tablet Take 1 tablet by mouth daily.     cetirizine (ZYRTEC) 10 MG tablet Take 10 mg by mouth daily.     Cholecalciferol (VITAMIN D3) 125 MCG (5000 UT) CAPS Take 1 capsule by mouth daily in the afternoon.     Continuous Glucose Sensor (FREESTYLE LIBRE 2 SENSOR) MISC APPLY SENSOR TO SKIN FOR CONTINUOUS BLOOD SUGAR MONITORING, REPLACE EVERY 14 DAYS 6 each 2   empagliflozin (JARDIANCE) 10 MG TABS tablet Take by mouth daily.     ferrous fumarate (HEMOCYTE - 106 MG FE) 325 (106 FE) MG TABS Take 1 tablet by mouth daily.     insulin aspart  (NOVOLOG  FLEXPEN) 100 UNIT/ML FlexPen Inject 8-16 Units into the skin 3 (three) times daily before meals. 30 mL 3   Insulin Glargine  (BASAGLAR  KWIKPEN) 100 UNIT/ML INJECT 26 UNITS INTO THE SKIN AT BEDTIME 15 mL 2   Insulin Pen Needle (DROPLET PEN NEEDLES) 31G X 8 MM MISC USE AS DIRECTED 100 each 5   montelukast  (SINGULAIR ) 10 MG tablet TAKE 1 TABLET BY MOUTH EVERY MORNING 30 tablet 5   multivitamin (THERAGRAN) per tablet Take 1 tablet by mouth daily.     pantoprazole  (PROTONIX ) 20 MG tablet TAKE 1 TABLET BY MOUTH DAILY FOR HEARTBURN 90 tablet 0   venlafaxine  XR (EFFEXOR -XR) 150 MG 24 hr capsule Take 1 capsule (150 mg total) by mouth daily with breakfast. for  anxiety and depression. 90 capsule 2   venlafaxine  XR (EFFEXOR -XR) 75 MG 24 hr capsule Take 1 capsule (75 mg total) by mouth daily. for anxiety and depression. 90 capsule 2   No current facility-administered medications on file prior to visit.   Allergies  Allergen Reactions   Clarithromycin Hives   Vicodin [Hydrocodone -Acetaminophen ] Nausea And Vomiting   Byetta 10 Mcg Pen [Exenatide]    Exenatide Other (See Comments)    Rash & itching at injection site   Family History  Problem Relation Age of Onset   Heart disease Mother 102       CAD   Asthma Mother    Dementia Mother    Stroke Mother    Breast cancer Mother    Diabetes Mother    Heart Problems Father    Diabetes Father    Kidney failure Father    Asthma Sister    Breast cancer Maternal Grandmother    Colon cancer Neg Hx    Esophageal cancer Neg Hx    Rectal cancer Neg Hx    Stomach cancer Neg Hx    Colon polyps Neg Hx    PE: There were no vitals taken for this visit. Wt Readings from Last 3 Encounters:  05/18/23 228 lb 3.2 oz (103.5 kg)  02/12/23 230 lb (104.3 kg)  12/26/22 226 lb 9.6 oz (102.8 kg)   Constitutional: overweight, in NAD Eyes: no exophthalmos ENT:  no thyromegaly, no cervical lymphadenopathy Cardiovascular: Tachycardia, RR, No MRG Respiratory: CTA B Musculoskeletal: no deformities Skin: no rashes Neurological: +  tremor with outstretched hands  ASSESSMENT: 1. DM2, insulin-dependent, uncontrolled, with complications - stage 4 CKD  2. HL  PLAN:  1. Patient  with longstanding, uncontrolled, type 2 diabetes, on SGLT2 inhibitor along with basal-bolus insulin, with significant improvement in control after starting long-acting insulin (HbA1c decreased from 11.3% to 7.2%). At last OV, HbA1c was 7.3% but she had another HbA1c which was better, 6.6% on 08/10/2023. - At last OV, sugars were fluctuating mainly within the target range but increasing after her snack at night then dropping afterwards when she  was correcting the high blood sugar after the snack and then bouncing up again after this drop.  We discussed about trying to avoid a snack but if she still decided to eat this, to cover it with 5-6 units of NovoLog  sugars were also occasionally lower after lunch and we discussed about wearing the dose of NovoLog  more CGM interpretation: -At today's visit, we reviewed her CGM downloads: It appears that *** of values are in target range (goal >70%), while *** are higher than 180 (goal <25%), and *** are lower than 70 (goal <4%).  The calculated average blood sugar is ***.  The projected HbA1c for the next 3 months ([facility]) is ***. -Reviewing the CGM trends, ***   - I suggested to:  Patient Instructions  Please continue: - Jardiance 10 mg daily in am - Basaglar  26 units at bedtime  - NovoLog  8-16 units 15 min before each main meal  If you have a snack, you may need NovoLog  5-6 units before the snack.  Please return in 3-4 months.    - HbA1c needs to be lower than 7% to qualify for transplant - we checked her HbA1c: 7%  - advised to check sugars at different times of the day - 4x a day, rotating check times - advised for yearly eye exams >> she is UTD - return to clinic in 3-4 months  2. HL - Latest lipid panel was reviewed from 10/2022: LDL above our target of less than 70, triglycerides normal, HDL slightly low - She continues Lipitor 10 mg daily without side effects  Lela Fendt, MD PhD [facility] Endocrinology

## 2023-10-30 ENCOUNTER — Ambulatory Visit (INDEPENDENT_AMBULATORY_CARE_PROVIDER_SITE_OTHER): Admitting: Primary Care

## 2023-10-30 ENCOUNTER — Encounter: Payer: Self-pay | Admitting: Primary Care

## 2023-10-30 VITALS — BP 118/66 | HR 132 | Temp 97.3°F | Ht 68.0 in | Wt 220.0 lb

## 2023-10-30 DIAGNOSIS — J4520 Mild intermittent asthma, uncomplicated: Secondary | ICD-10-CM | POA: Diagnosis not present

## 2023-10-30 DIAGNOSIS — G4733 Obstructive sleep apnea (adult) (pediatric): Secondary | ICD-10-CM

## 2023-10-30 DIAGNOSIS — Z0184 Encounter for antibody response examination: Secondary | ICD-10-CM | POA: Diagnosis not present

## 2023-10-30 DIAGNOSIS — G43109 Migraine with aura, not intractable, without status migrainosus: Secondary | ICD-10-CM | POA: Diagnosis not present

## 2023-10-30 DIAGNOSIS — Z7984 Long term (current) use of oral hypoglycemic drugs: Secondary | ICD-10-CM | POA: Diagnosis not present

## 2023-10-30 DIAGNOSIS — E785 Hyperlipidemia, unspecified: Secondary | ICD-10-CM | POA: Diagnosis not present

## 2023-10-30 DIAGNOSIS — Z0001 Encounter for general adult medical examination with abnormal findings: Secondary | ICD-10-CM

## 2023-10-30 DIAGNOSIS — F4321 Adjustment disorder with depressed mood: Secondary | ICD-10-CM

## 2023-10-30 DIAGNOSIS — I493 Ventricular premature depolarization: Secondary | ICD-10-CM

## 2023-10-30 DIAGNOSIS — K219 Gastro-esophageal reflux disease without esophagitis: Secondary | ICD-10-CM

## 2023-10-30 DIAGNOSIS — F3170 Bipolar disorder, currently in remission, most recent episode unspecified: Secondary | ICD-10-CM | POA: Diagnosis not present

## 2023-10-30 DIAGNOSIS — E1165 Type 2 diabetes mellitus with hyperglycemia: Secondary | ICD-10-CM

## 2023-10-30 DIAGNOSIS — N184 Chronic kidney disease, stage 4 (severe): Secondary | ICD-10-CM | POA: Diagnosis not present

## 2023-10-30 DIAGNOSIS — I10 Essential (primary) hypertension: Secondary | ICD-10-CM

## 2023-10-30 LAB — LIPID PANEL
Cholesterol: 141 mg/dL (ref 0–200)
HDL: 48.9 mg/dL (ref >39.00)
LDL Cholesterol: 43 mg/dL (ref 0–99)
NonHDL: 92.02
Total CHOL/HDL Ratio: 3
Triglycerides: 247 mg/dL — ABNORMAL HIGH (ref 0.0–149.0)
VLDL: 49.4 mg/dL — ABNORMAL HIGH (ref 0.0–40.0)

## 2023-10-30 MED ORDER — RIZATRIPTAN BENZOATE 5 MG PO TABS: ORAL_TABLET | ORAL | 0 refills | Status: AC

## 2023-10-30 NOTE — Assessment & Plan Note (Addendum)
 Recently dismissed from endocrinology due to missed appointments  Reviewed A1c from June 2025 through [facility] which is improved at 6.6 compared to 7.6. We will plan to meet back up in 1 month for repeat A1c and follow-up.  Continue Jardiance 10 mg daily, Basaglar  26 units daily, NovoLog  8 to 16 units.  Will refer to podiatry

## 2023-10-30 NOTE — Assessment & Plan Note (Signed)
Controlled.  Continue pantoprazole 20 mg daily.  

## 2023-10-30 NOTE — Patient Instructions (Addendum)
 Stop by the lab prior to leaving today. I will notify you of your results once received.   You will either be contacted via phone regarding your referral to podiatry and therapy, or you may receive a letter on your MyChart portal from our referral team with instructions for scheduling an appointment. Please let us  know if you have not been contacted by anyone within two weeks.  For migraine abortion you may take the rizatriptan  medication.  Take 1 tablet by mouth at migraine onset.  You may take a second tablet 2 hours later if your migraine persists.  Please update me.  Schedule a diabetes follow-up for 1 month.  It was a pleasure to see you today!

## 2023-10-30 NOTE — Assessment & Plan Note (Signed)
 Controlled. No concerns today.  Continue albuterol inhaler as needed.

## 2023-10-30 NOTE — Assessment & Plan Note (Signed)
 Untreated, never followed through with pulmonology. Declines today.

## 2023-10-30 NOTE — Assessment & Plan Note (Signed)
 Controlled.  Continue to monitor.

## 2023-10-30 NOTE — Assessment & Plan Note (Signed)
 Following with cardiology, office notes reviewed from August 2025  Start monitor as scheduled. Completed echo as scheduled.

## 2023-10-30 NOTE — Assessment & Plan Note (Signed)
 Slightly deteriorated.  Continue bupropion  XL 300 mg daily, venlafaxine  ER 75 mg and 150 mg daily for now. Referral placed for therapy.

## 2023-10-30 NOTE — Assessment & Plan Note (Signed)
 Lipid panel pending.  Continue aspirin 81 mg daily and atorvastatin  20 mg daily.

## 2023-10-30 NOTE — Assessment & Plan Note (Addendum)
 Following with nephrology and transplant team. Office notes and labs reviewed from transplant team from June 2025 through [facility].  Will check for MMR immunity per request.

## 2023-10-30 NOTE — Progress Notes (Signed)
 Subjective:    Patient ID: Samantha Hahn, female    DOB: 12/09/1960, 63 y.o.   MRN: [id number]  Samantha Hahn is a very pleasant 63 y.o. female who presents today for complete physical and follow up of chronic conditions.  She has a chronic history of migraines since college. Migraines  occurring currently 3 times monthly lasting 2 days which has been going on since July 2025. She just takes Tylenol  for her symptoms.  She's been treated for migraines previously with neurology. She has failed Topamax  which caused drowsiness, Vicodin and promethazine  (nausea, vomiting), imitrex . She has never tried Maxalt .   Since her parents have passed away she has found herself feeling down/sad, little motivation to do things, not wanting to be around anyone. She is managed on venlafaxine  ER 75 mg and 150 mg and Wellbutrin  XL 300 mg daily which has helped historically. She does not see therapy.  Previously following with psychiatry, has not followed up in over 1 year.  She would also like to see podiatry for toenail fungus and abnormal toenails.   Immunizations: -Tetanus: Completed in 2016 -Influenza: Influenza vaccine UTD  -Shingles: Completed Shingrix series -Pneumonia: Completed Prevnar 20 in 2025  Diet: Fair diet.  Exercise: No regular exercise.  Eye exam: Completes annually  Dental exam: Completes semi-annually    Pap Smear: Hysterectomy  Mammogram: Completed in May 2025  Colonoscopy: Completed in 2024, due 2029   BP Readings from Last 3 Encounters:  10/30/23 118/66  05/18/23 126/82  02/12/23 120/80   Wt Readings from Last 3 Encounters:  10/30/23 220 lb (99.8 kg)  05/18/23 228 lb 3.2 oz (103.5 kg)  02/12/23 230 lb (104.3 kg)      Review of Systems  Constitutional:  Negative for unexpected weight change.  HENT:  Negative for rhinorrhea.   Respiratory:  Negative for cough and shortness of breath.   Cardiovascular:  Negative for chest pain.  Gastrointestinal:  Negative for  constipation and diarrhea.  Genitourinary:  Negative for difficulty urinating.  Musculoskeletal:  Negative for arthralgias and myalgias.  Skin:  Negative for rash.       Toenail abnormality  Allergic/Immunologic: Negative for environmental allergies.  Neurological:  Positive for headaches. Negative for dizziness.  Psychiatric/Behavioral:  The patient is not nervous/anxious.        See HPI         Past Medical History:  Diagnosis Date   Abnormal vaginal Pap smear    Acne rosacea    Adenomatous colon polyp 11/21/2012   Allergy    Anemia    Anxiety    Asthma with COPD with status asthmaticus (HCC)    Chronic kidney disease    Pt is on transplant list at [facility]   Depression    resolved   Diabetes mellitus    Dysrhythmia    pvc   GERD (gastroesophageal reflux disease)    Hot flashes    Hyperlipidemia    preventative meds per pt    Hypertension     resolved,     dr hochrein   Migraines    PVC (premature ventricular contraction)    Sleep apnea    workup still in process    Social History   Socioeconomic History   Marital status: Divorced    Spouse name: Not on file   Number of children: 1   Years of education: Not on file   Highest education level: Master's degree (e.g., MA, MS, MEng, MEd, MSW, MBA)  Occupational  History   Occupation: PT    Employer: [employer]   Occupation: retired  Tobacco Use   Smoking status: Never   Smokeless tobacco: Never  Vaping Use   Vaping status: Never Used  Substance and Sexual Activity   Alcohol use: Never    Comment: twice a year mixed drink   Drug use: Never   Sexual activity: Not Currently    Birth control/protection: Surgical  Other Topics Concern   Not on file  Social History Narrative   Social history: She is divorced.  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  Does not smoke or consume alcohol.  She is now caring for her elderly parents in their home which is stressful.   However she says it is less stressful now that she is living with them.       Family history: Brother with history of hypertrophic cardiomyopathy.  1 sister in good health.  Mother with history of stroke, heart disease, kidney stones as well as diabetes.  Father with history of diabetes.   Social Drivers of Corporate investment banker Strain: Low Risk  (08/06/2023)   Overall Financial Resource Strain (CARDIA)    Difficulty of Paying Living Expenses: Not hard at all  Food Insecurity: No Food Insecurity (08/06/2023)   Hunger Vital Sign    Worried About Running Out of Food in the Last Year: Never true    Ran Out of Food in the Last Year: Never true  Transportation Needs: No Transportation Needs (08/06/2023)   PRAPARE - Administrator, Civil Service (Medical): No    Lack of Transportation (Non-Medical): No  Physical Activity: Inactive (08/06/2023)   Exercise Vital Sign    Days of Exercise per Week: 0 days    Minutes of Exercise per Session: Not on file  Stress: Stress Concern Present (08/06/2023)   [employer] of Occupational Health - Occupational Stress Questionnaire    Feeling of Stress: Rather much  Social Connections: Socially Isolated (08/06/2023)   Social Connection and Isolation Panel    Frequency of Communication with Friends and Family: More than three times a week    Frequency of Social Gatherings with Friends and Family: Once a week    Attends Religious Services: Never    Database administrator or Organizations: No    Attends Engineer, structural: Not on file    Marital Status: Divorced  Intimate Partner Violence: Not on file    Past Surgical History:  Procedure Laterality Date   ABDOMINAL HYSTERECTOMY     COLONOSCOPY     POLYPECTOMY     SHOULDER ARTHROSCOPY  06/08/2011   Procedure: ARTHROSCOPY SHOULDER;  Surgeon: Franky CHRISTELLA Pointer, MD;  Location: [facility] OR;  Service: Orthopedics;  Laterality: Left;  MUA, LOA, SAD, DCR    Family History  Problem  Relation Age of Onset   Heart disease Mother 56       CAD   Asthma Mother    Dementia Mother    Stroke Mother    Breast cancer Mother    Diabetes Mother    Heart Problems Father    Diabetes Father    Kidney failure Father    Asthma Sister    Breast cancer Maternal Grandmother    Colon cancer Neg Hx    Esophageal cancer Neg Hx    Rectal cancer Neg Hx    Stomach cancer Neg Hx    Colon  polyps Neg Hx     Allergies  Allergen Reactions   Clarithromycin Hives   Vicodin [Hydrocodone -Acetaminophen ] Nausea And Vomiting   Byetta 10 Mcg Pen [Exenatide]    Exenatide Other (See Comments)    Rash & itching at injection site    Current Outpatient Medications on File Prior to Visit  Medication Sig Dispense Refill   albuterol  (VENTOLIN  HFA) 108 (90 Base) MCG/ACT inhaler INHALE TWO PUFFS BY MOUTH EVERY 4 TO 6 HOURS AS NEEDED FOR COUGH OR WHEEZING 18 g 1   aspirin EC 81 MG tablet Take 81 mg by mouth daily. Swallow whole.     atorvastatin  (LIPITOR) 20 MG tablet Take 1 tablet (20 mg total) by mouth daily. for cholesterol. 90 tablet 1   buPROPion  (WELLBUTRIN  XL) 300 MG 24 hr tablet Take 1 tablet (300 mg total) by mouth daily. For anxiety/depression 90 tablet 1   Calcium -Vitamin D  600-200 MG-UNIT per tablet Take 1 tablet by mouth daily.     cetirizine (ZYRTEC) 10 MG tablet Take 10 mg by mouth daily.     Cholecalciferol (VITAMIN D3) 125 MCG (5000 UT) CAPS Take 1 capsule by mouth daily in the afternoon.     Continuous Glucose Sensor (FREESTYLE LIBRE 2 SENSOR) MISC APPLY SENSOR TO SKIN FOR CONTINUOUS BLOOD SUGAR MONITORING, REPLACE EVERY 14 DAYS 6 each 2   empagliflozin (JARDIANCE) 10 MG TABS tablet Take by mouth daily.     ferrous fumarate (HEMOCYTE - 106 MG FE) 325 (106 FE) MG TABS Take 1 tablet by mouth daily.     Insulin Glargine  (BASAGLAR  KWIKPEN) 100 UNIT/ML INJECT 26 UNITS INTO THE SKIN AT BEDTIME 15 mL 2   Insulin Pen Needle (DROPLET PEN NEEDLES) 31G X 8 MM MISC USE AS DIRECTED 100 each 5    montelukast  (SINGULAIR ) 10 MG tablet TAKE 1 TABLET BY MOUTH EVERY MORNING 30 tablet 5   multivitamin (THERAGRAN) per tablet Take 1 tablet by mouth daily.     NOVOLOG  FLEXPEN 100 UNIT/ML FlexPen INJECT 8-16 UNITS INTO THE SKIN THREE TIMES A DAY BEFORE MEALS 15 mL 0   pantoprazole  (PROTONIX ) 20 MG tablet TAKE 1 TABLET BY MOUTH DAILY FOR HEARTBURN 90 tablet 0   venlafaxine  XR (EFFEXOR -XR) 150 MG 24 hr capsule Take 1 capsule (150 mg total) by mouth daily with breakfast. for anxiety and depression. 90 capsule 2   venlafaxine  XR (EFFEXOR -XR) 75 MG 24 hr capsule Take 1 capsule (75 mg total) by mouth daily. for anxiety and depression. 90 capsule 2   Continuous Glucose Sensor (FREESTYLE LIBRE 2 PLUS SENSOR) MISC Inject 1 each into the skin as directed. Change sensor every 15 days (Patient not taking: Reported on 10/30/2023) 6 each 0   No current facility-administered medications on file prior to visit.    BP 118/66   Pulse (!) 132   Temp (!) 97.3 F (36.3 C) (Temporal)   Ht 5' 8 (1.727 m)   Wt 220 lb (99.8 kg)   SpO2 97%   BMI 33.45 kg/m  Objective:   Physical Exam HENT:     Right Ear: Tympanic membrane and ear canal normal.     Left Ear: Tympanic membrane and ear canal normal.  Eyes:     Pupils: Pupils are equal, round, and reactive to light.  Cardiovascular:     Rate and Rhythm: Normal rate and regular rhythm.  Pulmonary:     Effort: Pulmonary effort is normal.     Breath sounds: Normal breath sounds.  Abdominal:  General: Bowel sounds are normal.     Palpations: Abdomen is soft.     Tenderness: There is no abdominal tenderness.  Musculoskeletal:        General: Normal range of motion.     Cervical back: Neck supple.  Skin:    General: Skin is warm and dry.     Comments: Thickened and yellow toenail to great left toenail.  Neurological:     Mental Status: She is alert and oriented to person, place, and time.     Cranial Nerves: No cranial nerve deficit.     Deep Tendon  Reflexes:     Reflex Scores:      Patellar reflexes are 2+ on the right side and 2+ on the left side. Psychiatric:        Mood and Affect: Mood normal.     Physical Exam        Assessment & Plan:  Encounter for annual general medical examination with abnormal findings in adult Assessment & Plan: Immunizations UTD.  Mammogram UTD Colonoscopy UTD, due 2029  Discussed the importance of a healthy diet and regular exercise in order for weight loss, and to reduce the risk of further co-morbidity.  Exam stable. Labs pending.  Follow up in 1 year for repeat physical.    PVC (premature ventricular contraction) Assessment & Plan: Following with cardiology, office notes reviewed from August 2025  Start monitor as scheduled. Completed echo as scheduled.      Migraine with aura and without status migrainosus, not intractable Assessment & Plan: Uncontrolled.  Discussed options for treatment. Given her CKD options are more limited.  Will start with rizatriptan  5 mg as needed.  Instructions provided. She will update.  Orders: -     Rizatriptan  Benzoate; Take 1 tablet by mouth at migraine onset. May repeat in 2 hours if needed  Dispense: 10 tablet; Refill: 0  Hypertension, unspecified type Assessment & Plan: Controlled.  Continue to monitor   OSA (obstructive sleep apnea) Assessment & Plan: Untreated, never followed through with pulmonology. Declines today.   Mild intermittent asthma without complication Assessment & Plan: Controlled.  No concerns today. Continue albuterol  inhaler as needed.   Gastroesophageal reflux disease, unspecified whether esophagitis present Assessment & Plan: Controlled.  Continue pantoprazole  20 mg daily   Type 2 diabetes mellitus with hyperglycemia, without long-term current use of insulin (HCC) Assessment & Plan: Recently dismissed from endocrinology due to missed appointments  Reviewed A1c from June 2025 through Care  Everywhere which is improved at 6.6 compared to 7.6. We will plan to meet back up in 1 month for repeat A1c and follow-up.  Continue Jardiance 10 mg daily, Basaglar  26 units daily, NovoLog  8 to 16 units.  Will refer to podiatry  Orders: -     Ambulatory referral to Podiatry  Stage 4 chronic kidney disease [facility]) Assessment & Plan: Following with nephrology and transplant team. Office notes and labs reviewed from transplant team from June 2025 through [facility] Everywhere.  Will check for MMR immunity per request.   Hyperlipidemia, unspecified hyperlipidemia type Assessment & Plan: Lipid panel pending.  Continue aspirin 81 mg daily and atorvastatin  20 mg daily.  Orders: -     Lipid panel  Bipolar affective disorder in remission [facility]) Assessment & Plan: Slightly deteriorated.  Continue bupropion  XL 300 mg daily, venlafaxine  ER 75 mg and 150 mg daily for now. Referral placed for therapy.   Immunity status testing -     Measles/Mumps/Rubella Immunity  Grief -  Ambulatory referral to Psychology    Assessment and Plan Assessment & Plan         Comer MARLA Gaskins, NP     History of Present Illness

## 2023-10-30 NOTE — Assessment & Plan Note (Signed)
 Uncontrolled.  Discussed options for treatment. Given her CKD options are more limited.  Will start with rizatriptan  5 mg as needed.  Instructions provided. She will update.

## 2023-10-30 NOTE — Assessment & Plan Note (Signed)
Immunizations UTD.  Mammogram UTD Colonoscopy UTD, due 2029  Discussed the importance of a healthy diet and regular exercise in order for weight loss, and to reduce the risk of further co-morbidity.  Exam stable. Labs pending.  Follow up in 1 year for repeat physical.

## 2023-10-31 ENCOUNTER — Ambulatory Visit: Payer: Self-pay | Admitting: Primary Care

## 2023-10-31 LAB — MEASLES/MUMPS/RUBELLA IMMUNITY
Mumps IgG: <9 [AU]/ml — ABNORMAL LOW
Rubella: 25.7 {index}
Rubeola IgG: >300 [AU]/ml

## 2023-11-13 ENCOUNTER — Ambulatory Visit: Admitting: Podiatry

## 2023-11-20 ENCOUNTER — Ambulatory Visit: Admitting: Podiatry

## 2023-11-22 ENCOUNTER — Other Ambulatory Visit: Payer: Self-pay | Admitting: Primary Care

## 2023-11-22 DIAGNOSIS — K219 Gastro-esophageal reflux disease without esophagitis: Secondary | ICD-10-CM

## 2023-11-23 DIAGNOSIS — R000 Tachycardia, unspecified: Secondary | ICD-10-CM | POA: Diagnosis not present

## 2023-11-23 DIAGNOSIS — R9439 Abnormal result of other cardiovascular function study: Secondary | ICD-10-CM | POA: Diagnosis not present

## 2023-11-26 DIAGNOSIS — I129 Hypertensive chronic kidney disease with stage 1 through stage 4 chronic kidney disease, or unspecified chronic kidney disease: Secondary | ICD-10-CM | POA: Diagnosis not present

## 2023-11-26 DIAGNOSIS — E669 Obesity, unspecified: Secondary | ICD-10-CM | POA: Diagnosis not present

## 2023-11-26 DIAGNOSIS — E1122 Type 2 diabetes mellitus with diabetic chronic kidney disease: Secondary | ICD-10-CM | POA: Diagnosis not present

## 2023-11-26 DIAGNOSIS — N184 Chronic kidney disease, stage 4 (severe): Secondary | ICD-10-CM | POA: Diagnosis not present

## 2023-11-27 ENCOUNTER — Other Ambulatory Visit: Payer: Self-pay | Admitting: Primary Care

## 2023-11-27 DIAGNOSIS — F3170 Bipolar disorder, currently in remission, most recent episode unspecified: Secondary | ICD-10-CM

## 2023-11-27 LAB — LAB REPORT - SCANNED: EGFR: 20

## 2023-12-06 ENCOUNTER — Ambulatory Visit: Admitting: Podiatry

## 2023-12-06 DIAGNOSIS — R000 Tachycardia, unspecified: Secondary | ICD-10-CM | POA: Diagnosis not present

## 2023-12-25 ENCOUNTER — Ambulatory Visit: Admitting: Primary Care

## 2023-12-25 ENCOUNTER — Other Ambulatory Visit: Payer: Self-pay | Admitting: Primary Care

## 2023-12-25 DIAGNOSIS — E785 Hyperlipidemia, unspecified: Secondary | ICD-10-CM

## 2024-01-01 DIAGNOSIS — B351 Tinea unguium: Secondary | ICD-10-CM | POA: Diagnosis not present

## 2024-01-01 DIAGNOSIS — E119 Type 2 diabetes mellitus without complications: Secondary | ICD-10-CM | POA: Diagnosis not present

## 2024-01-01 DIAGNOSIS — B353 Tinea pedis: Secondary | ICD-10-CM | POA: Diagnosis not present

## 2024-01-09 ENCOUNTER — Ambulatory Visit: Admitting: Primary Care

## 2024-01-09 DIAGNOSIS — E1165 Type 2 diabetes mellitus with hyperglycemia: Secondary | ICD-10-CM

## 2024-01-14 ENCOUNTER — Telehealth: Payer: Self-pay

## 2024-01-14 NOTE — Telephone Encounter (Signed)
 Copied from CRM #[id number]. Topic: Clinical - Medication Prior Auth >> Jan 14, 2024  2:59 PM Frederich PARAS wrote: Reason for CRM: pt requested jardiace, she was told she needed a new pre auth. PT Says it was electronically sent over, reached out to cal could not get an update on it. Pt would like a update on this because she needs the medication. Pt call [phone]

## 2024-01-14 NOTE — Telephone Encounter (Signed)
 Her previous 2 kidney doctors were doing the [place]. Pt states Mallie said she would take over her Diabetes care and the Jardiance that nephrology had been doing.   The PA ran out at the end of October. Mallie has not had to do a rx for it, yet as she has had refills.   Will send to PA Team to see if they are able to find PA info.

## 2024-01-16 ENCOUNTER — Telehealth: Payer: Self-pay

## 2024-01-16 ENCOUNTER — Other Ambulatory Visit (HOSPITAL_COMMUNITY): Payer: Self-pay

## 2024-01-16 NOTE — Telephone Encounter (Signed)
 Pharmacy Patient Advocate Encounter   Received notification from Pt Calls Messages that prior authorization for Jardiance 10 is required/requested.   Insurance verification completed.   The patient is insured through [facility].   Per test claim: PA required; PA submitted to above mentioned insurance via Latent Key/confirmation #/EOC [id number] Status is pending

## 2024-01-19 ENCOUNTER — Other Ambulatory Visit: Payer: Self-pay | Admitting: Internal Medicine

## 2024-01-21 ENCOUNTER — Other Ambulatory Visit (HOSPITAL_COMMUNITY): Payer: Self-pay

## 2024-01-22 NOTE — Telephone Encounter (Signed)
 PA request has been Received. New Encounter has been or will be created for follow up. For additional info see Pharmacy Prior Auth telephone encounter from 01/16/24.

## 2024-01-24 ENCOUNTER — Ambulatory Visit: Admitting: Psychology

## 2024-01-24 ENCOUNTER — Encounter: Payer: Self-pay | Admitting: Psychology

## 2024-01-24 ENCOUNTER — Other Ambulatory Visit: Payer: Self-pay | Admitting: Primary Care

## 2024-01-24 ENCOUNTER — Other Ambulatory Visit: Payer: Self-pay | Admitting: Internal Medicine

## 2024-01-24 DIAGNOSIS — F331 Major depressive disorder, recurrent, moderate: Secondary | ICD-10-CM

## 2024-01-24 DIAGNOSIS — F419 Anxiety disorder, unspecified: Secondary | ICD-10-CM

## 2024-01-24 DIAGNOSIS — F3170 Bipolar disorder, currently in remission, most recent episode unspecified: Secondary | ICD-10-CM

## 2024-01-24 DIAGNOSIS — F4321 Adjustment disorder with depressed mood: Secondary | ICD-10-CM | POA: Diagnosis not present

## 2024-01-24 DIAGNOSIS — E1165 Type 2 diabetes mellitus with hyperglycemia: Secondary | ICD-10-CM

## 2024-01-24 MED ORDER — FREESTYLE LIBRE 2 PLUS SENSOR MISC: 1.0000 | 0 refills | Status: AC

## 2024-01-24 NOTE — Telephone Encounter (Unsigned)
 Copied from CRM #[id number]. Topic: Clinical - Medication Refill >> Jan 24, 2024  9:17 AM Rea ORN wrote: Medication: Continuous Glucose Sensor (FREESTYLE LIBRE 2 PLUS SENSOR) MISC   Has the patient contacted their pharmacy? Yes (Agent: If no, request that the patient contact the pharmacy for the refill. If patient does not wish to contact the pharmacy document the reason why and proceed with request.) (Agent: If yes, when and what did the pharmacy advise?)  This is the patient's preferred pharmacy:  [id number] [place], [place] - [address] [place] [address] Phone: [phone] Fax: [phone]  Is this the correct pharmacy for this prescription? Yes If no, delete pharmacy and type the correct one.   Has the prescription been filled recently? No  Is the patient out of the medication? Yes  Has the patient been seen for an appointment in the last year OR does the patient have an upcoming appointment? Yes  Can we respond through MyChart? No  Agent: Please be advised that Rx refills may take up to 3 business days. We ask that you follow-up with your pharmacy.

## 2024-01-24 NOTE — Progress Notes (Signed)
 [facility] Behavioral Health Counselor Initial Adult Exam  Name: Samantha Hahn Date: 01/24/2024 MRN: [id number] DOB: 30-Oct-1960 PCP: Gretta Comer POUR, NP  Time spent: 10:50am-12:00pm  Pt is seen for in person visit.  Guardian/Payee:  self    Paperwork requested: No   Reason for Visit /Presenting Problem: Pt is referred by PCP for depression and grief.  Pt reports that her mother died 2022-04-02and her father died 03/24/22.  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  Following parents passing she was in charge of closing the estate.  Pt moved into her new home July 2024.  Pt reports their has been some stress w/ sister blowing up on her Sept 2025 blaming her of not doing equitably.  Pt reported this was very upsetting to her as worked hard to ensure things were fair and equitable.  Pt reports this year she is struggling w/ depressed mood, loss of interest and no motivation for things, difficulty getting started/initiating. Pt has been tx for depression for the past 5 years w/ medications by psychiatrist initially and when insurance no longer accepted her PCP has maintained.     Behavior:  Appropriate  Motor:  Normal  Speech/Language:   Clear and Coherent  Affect:  Appropriate  Mood:  depressed  Thought process:  normal  Thought content:    WNL  Sensory/Perceptual disturbances:    WNL  Orientation:  oriented to person, place, time/date, and situation  Attention:  Good  Concentration:  Good  Memory:  WNL  Fund of knowledge:   Good  Insight:    Good  Judgment:   Good  Impulse Control:  Good   Reported Symptoms:  pt reports sleep disturbance daily- pt reports she wants to sleep all the time, difficult to get out of bed and her sleep is restless- waking at least every 2 hours.  Pt reports her sleep scheudule is off w/ sleeping during day and up at night.  Pt reports depressed mood and no motivation for things.  Pt report struggles for self care daily.   Pt reports that will cancel appointments/ plans as no motivation.  Pt reports still hasn't unpacked boxes or empty storage after move almost 1.5 years ago.  Pt reports some difficulty relaxing.  Pt reports feeling that should be further along w/ grief. Pt reports still will hear dad whistling and mom touching her.  Pt was able to acknowledge that this is a comfort and able to reframe as not a problem.  Pt also recognizing that having difficult w/ short term memory.   Risk Assessment: Danger to Self:  No Self-injurious Behavior: No Danger to Others: No Duty to Warn:no Physical Aggression / Violence:No  Access to Firearms a concern: No  Gang Involvement:No  Patient / guardian was educated about steps to take if suicide or homicide risk level increases between visits: no While future psychiatric events cannot be accurately predicted, the patient does not currently require acute inpatient psychiatric care and does not currently meet [facility]  involuntary commitment criteria.  Substance Abuse History: Current substance abuse: No     Past Psychiatric History:   Previous psychological history is significant for depression Outpatient Providers: saw LCSW 1x and not a good fit.  Pt reports she dx her w/ bipolar d/o w/out hx of manic/hypomanic episodes. History of Psych Hospitalization: No  Psychological Testing: none   Abuse History:  Victim of: No., none   Report needed:  No. Victim of Neglect:No. Perpetrator of none  Witness / Exposure to Domestic Violence: No   Protective Services Involvement: No  Witness to [employer] Violence:  No   Family History:  Family History  Problem Relation Age of Onset   Heart disease Mother 41       CAD   Asthma Mother    Dementia Mother    Stroke Mother    Breast cancer Mother    Diabetes Mother    Heart Problems Father    Diabetes Father    Kidney failure Father    Asthma Sister    Breast cancer Maternal Grandmother    Colon cancer Neg Hx     Esophageal cancer Neg Hx    Rectal cancer Neg Hx    Stomach cancer Neg Hx    Colon polyps Neg Hx   Pt reports she grew up in [place], [place].  Pt reports she is the oldest w/ sister 1.5 years younger and brother 4 years younger.  Pt reports she is close w/ brother.  Pt reports tension w/ sister current.  Pt reports she was very close to mom- like best friends.  Pt reports mom suffered from depression but never sought tx.    Living situation: the patient lives alone  Sexual Orientation: Straight  Relationship Status: divorced in 2006 after 75 year marriage.  Pt reports that she and ex get along ok and able to be present at family functions together.   If a parent, number of children / ages:daughter age 36y/o.  Pt has 2 step children- son 78 y/o and 2 adult stepgrandkids by and step daughter age 48y/o.  Support Systems: brother and daughter and 2 good friends- Cherika and Neurosurgeon Stress:  No   Income/Employment/Disability: retirement income.  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  Military Service: No   Educational History: Education: Risk Manager: No faith or religious beliefs  Any cultural differences that may affect / interfere with treatment:  not applicable   Recreation/Hobbies: wathcing movies and getting together w/ friends  Stressors: Health problems   Loss of parents    Strengths: Family, Friends, Journalist, Newspaper, and Able to Communicate Effectively  Barriers:  none   Legal History: Pending legal issue / charges: The patient has no significant history of legal issues. History of legal issue / charges: none  Medical History/Surgical History: reviewed Past Medical History:  Diagnosis Date   Abnormal vaginal Pap smear    Acne rosacea    Adenomatous colon polyp 11/21/2012   Allergy    Anemia    Anxiety    Asthma with COPD with status asthmaticus (HCC)    Chronic kidney disease     Pt is on transplant list at [facility]   Depression    resolved   Diabetes mellitus    Dysrhythmia    pvc   GERD (gastroesophageal reflux disease)    Hot flashes    Hyperlipidemia    preventative meds per pt    Hypertension     resolved,     dr hochrein   Migraines    PVC (premature ventricular contraction)    Sleep apnea    workup still in process  Diabetes dx in 2003.  Pt has kidney disease and is on waitlist for transplant.  Pt kidney disease is stable current.   Past Surgical History:  Procedure Laterality Date   ABDOMINAL HYSTERECTOMY  COLONOSCOPY     POLYPECTOMY     SHOULDER ARTHROSCOPY  06/08/2011   Procedure: ARTHROSCOPY SHOULDER;  Surgeon: Franky CHRISTELLA Pointer, MD;  [facility];  Service: Orthopedics;  Laterality: Left;  MUA, LOA, SAD, DCR    Medications: Current Outpatient Medications  Medication Sig Dispense Refill   albuterol  (VENTOLIN  HFA) 108 (90 Base) MCG/ACT inhaler INHALE TWO PUFFS BY MOUTH EVERY 4 TO 6 HOURS AS NEEDED FOR COUGH OR WHEEZING 18 g 1   aspirin EC 81 MG tablet Take 81 mg by mouth daily. Swallow whole.     atorvastatin  (LIPITOR) 20 MG tablet Take 1 tablet (20 mg total) by mouth daily. for cholesterol. 90 tablet 2   buPROPion  (WELLBUTRIN  XL) 300 MG 24 hr tablet Take 1 tablet (300 mg total) by mouth daily. For depression 90 tablet 0   Calcium -Vitamin D  600-200 MG-UNIT per tablet Take 1 tablet by mouth daily.     cetirizine (ZYRTEC) 10 MG tablet Take 10 mg by mouth daily.     Cholecalciferol (VITAMIN D3) 125 MCG (5000 UT) CAPS Take 1 capsule by mouth daily in the afternoon.     Continuous Glucose Sensor (FREESTYLE LIBRE 2 PLUS SENSOR) MISC Inject 1 each into the skin as directed. Change sensor every 15 days (Patient not taking: Reported on 10/30/2023) 6 each 0   Continuous Glucose Sensor (FREESTYLE LIBRE 2 SENSOR) MISC APPLY SENSOR TO SKIN FOR CONTINUOUS BLOOD SUGAR MONITORING, REPLACE EVERY 14 DAYS 6 each 2   empagliflozin (JARDIANCE) 10 MG TABS tablet  Take by mouth daily.     ferrous fumarate (HEMOCYTE - 106 MG FE) 325 (106 FE) MG TABS Take 1 tablet by mouth daily.     Insulin Glargine  (BASAGLAR  KWIKPEN) 100 UNIT/ML INJECT 26 UNITS INTO THE SKIN AT BEDTIME 15 mL 2   Insulin Pen Needle (DROPLET PEN NEEDLES) 31G X 8 MM MISC USE AS DIRECTED 100 each 5   montelukast  (SINGULAIR ) 10 MG tablet TAKE 1 TABLET BY MOUTH EVERY MORNING 30 tablet 5   multivitamin (THERAGRAN) per tablet Take 1 tablet by mouth daily.     NOVOLOG  FLEXPEN 100 UNIT/ML FlexPen INJECT 8-16 UNITS INTO THE SKIN THREE TIMES A DAY BEFORE MEALS 15 mL 0   pantoprazole  (PROTONIX ) 20 MG tablet TAKE 1 TABLET BY MOUTH DAILY FOR HEARTBURN 90 tablet 2   rizatriptan  (MAXALT ) 5 MG tablet Take 1 tablet by mouth at migraine onset. May repeat in 2 hours if needed 10 tablet 0   venlafaxine  XR (EFFEXOR -XR) 150 MG 24 hr capsule TAKE 1 CAPSULE BY MOUTH DAILY WITH BREAKFAST FOR ANXIETY AND DEPRESSION 90 capsule 2   venlafaxine  XR (EFFEXOR -XR) 75 MG 24 hr capsule TAKE 1 CAPSULE BY MOUTH DAILY FOR ANXIETY AND DEPRESSION 90 capsule 2   No current facility-administered medications for this visit.    Allergies  Allergen Reactions   Clarithromycin Hives   Vicodin [Hydrocodone -Acetaminophen ] Nausea And Vomiting   Byetta 10 Mcg Pen [Exenatide]    Exenatide Other (See Comments)    Rash & itching at injection site    Diagnoses:  Major depressive disorder, recurrent episode, moderate (HCC)  Grief  Plan of Care: pt is a 63y/o female seeking counseling for depression and grief as referred by her PCP.  Pt reports started being tx for depression in 02-17-2018.  Pt reports she retired and became main caregiver for her mom and dad- moving in w/ them.  Pt parents died in 2021/02/17 and 02/18/23 and then pt was responsible for closing  their estate and moved into own place in 08/2022.  Pt reports she is struggling w/ motivation, engaging and depressed moods.  Pt is seeking counseling for support and coping skills.  Pt will f/u w/  PCP about medications and potential consult for medications.  Pt to f/u w/ counseling in 2 weeks.    BARBARANN APPL, LCMHC

## 2024-01-24 NOTE — Progress Notes (Signed)
 Behavioral Health Treatment Plan   Name:Samantha Hahn    MRN: [id number]   Treatment Plan Development Date: 01/24/24   Strengths: Family, Friends, Self Advocate, and [name] enjoys watching movies  Supports: brother, daughter, 2 close friends- Analys and Hansen   Client Statement of Needs: Pt  I would like to have the energy and 'tricks in my pocket' to help get going.  Being able to talk about mom and dad a little bit will helpful. Figure out how to tackle things (plan). Crack down on my sleep- I recognize I don't have a routine, which doesn't give consistent times of when I'm doing things.   Treatment Level:outpt counseling  Client Treatment Preferences: Biweekly counseling- in person preferred   Diagnosis Depressive Disorders  MDD, recurrent, moderate  Symptoms:  Depressed mood-indicated by subjective report or observation by others (in children and adolescents, can be irritable mood)., Loss of interest or pleasure in almost all activities-indicated by subjective report or observation by others., Sleep disturbance (insomnia or hypersomnia)., Psychomotor changes (agitation or retardation) severe enough to be observable by others., Tiredness, fatigue, or low energy, or decreased efficiency with which routine tasks are completed., and Impaired ability to think, concentrate, or make decisions-indicated by subjective report or observation by others.  Goals:  Alleviate depressive symptoms to return to effective functioning.  Objectives: Target Date For All Objectives: 01/23/25  Identify and replace thoughts and beliefs that support depression., Learn and implement strategies to overcome depression., Learn and implement problem-solving., Learn and implement decision-making skills., and Use mindfulness and acceptance strategies to reduce experiential and cognitive avoidance and increase value-based behavior.   Progress  Documentation:  Progressing  Interventions:  Cognitive Behavioral Therapy, Assertiveness/Communication, Mindfulness Meditation, Motivational Interviewing, Solution-Oriented/Positive Psychology, Insight-Oriented, and behavioral activation, supportive   Expected duration of treatment: 1 year and revaluate  Party responsible for implementation of interventions: pt and counselor.   This plan has been reviewed and created by the following participants: pt and counselor   A new plan will be created at least every 12 months.  The patient fully participated in the development of treatment plan with the clinician and verbally consents to such treatment.   Patient Treatment Plan Signature Obtained: No, pending signature via MyChart. Pt verbal consent obtained.   Tavarion Babington, [facility]                [place], LCMHC

## 2024-01-31 ENCOUNTER — Other Ambulatory Visit: Payer: Self-pay | Admitting: Internal Medicine

## 2024-02-01 ENCOUNTER — Ambulatory Visit: Admitting: Primary Care

## 2024-02-01 ENCOUNTER — Encounter: Payer: Self-pay | Admitting: Primary Care

## 2024-02-01 ENCOUNTER — Ambulatory Visit: Payer: Self-pay | Admitting: Primary Care

## 2024-02-01 VITALS — BP 128/78 | HR 107 | Temp 97.9°F | Ht 68.0 in | Wt 225.1 lb

## 2024-02-01 DIAGNOSIS — E1165 Type 2 diabetes mellitus with hyperglycemia: Secondary | ICD-10-CM

## 2024-02-01 DIAGNOSIS — Z794 Long term (current) use of insulin: Secondary | ICD-10-CM

## 2024-02-01 DIAGNOSIS — J4520 Mild intermittent asthma, uncomplicated: Secondary | ICD-10-CM

## 2024-02-01 LAB — POCT GLYCOSYLATED HEMOGLOBIN (HGB A1C): Hemoglobin A1C: 8.2 % — AB (ref 4.0–5.6)

## 2024-02-01 MED ORDER — MONTELUKAST SODIUM 10 MG PO TABS: 10.0000 mg | ORAL_TABLET | Freq: Every morning | ORAL | 2 refills | Status: AC

## 2024-02-01 MED ORDER — NOVOLOG FLEXPEN 100 UNIT/ML ~~LOC~~ SOPN: 8.0000 [IU] | PEN_INJECTOR | Freq: Three times a day (TID) | SUBCUTANEOUS | 0 refills | Status: AC

## 2024-02-01 MED ORDER — ALBUTEROL SULFATE HFA 108 (90 BASE) MCG/ACT IN AERS: INHALATION_SPRAY | RESPIRATORY_TRACT | 0 refills | Status: AC

## 2024-02-01 NOTE — Progress Notes (Signed)
 Subjective:    Patient ID: Samantha Hahn, female    DOB: [birth year], 63 y.o.   MRN: [id number]  Samantha Hahn is a very pleasant 63 y.o. female with a history of hypertension, type 2 diabetes, CKD, hyperlipidemia who presents today for follow-up of diabetes.  She is also needing refills.   1) Type 2 Diabetes:  Current medications include: Basaglar  22 units at bedtime, Novolog  8-15 TID with meals, Jardiance 10 mg daily.  She received a denial letter for Jardiance from her insurance company. She's been taking Jardiance for the last one year. Her Basaglar  will be cost prohibitive starting in January 2026. She will need an alternative.   She is checking her blood glucose continuously and is getting readings of:  AM fasting: low 100s Before lunch:  Before dinner: mid 100s   Time in target, last 7 and 14 days: 65%   Last A1C: 6.6 in June 2025, 8.2 today Last Eye Exam: Due Last Foot Exam: Due  Pneumonia Vaccination: 2025 Urine Microalbumin: UTD Statin: atorvastatin    Dietary changes since last visit: She's not eating as well lately because of the holidays. She's increased her intake of starches and sweets.    Exercise: None      Review of Systems  Respiratory:  Negative for shortness of breath.   Cardiovascular:  Negative for chest pain.  Neurological:  Negative for numbness.         Past Medical History:  Diagnosis Date   Abnormal vaginal Pap smear    Acne rosacea    Adenomatous colon polyp 11/21/2012   Allergy    Anemia    Anxiety    Asthma with COPD with status asthmaticus (HCC)    Chronic kidney disease    Pt is on transplant list at [facility]   Depression    resolved   Diabetes mellitus    Dysrhythmia    pvc   GERD (gastroesophageal reflux disease)    Hot flashes    Hyperlipidemia    preventative meds per pt    Hypertension     resolved,     dr hochrein   Migraines    PVC (premature ventricular contraction)    Sleep apnea    workup still in process     Social History   Socioeconomic History   Marital status: Divorced    Spouse name: Not on file   Number of children: 1   Years of education: Not on file   Highest education level: Master's degree (e.g., MA, MS, MEng, MEd, MSW, MBA)  Occupational History   Occupation: PT    Employer: [employer]   Occupation: retired  Tobacco Use   Smoking status: Never   Smokeless tobacco: Never  Vaping Use   Vaping status: Never Used  Substance and Sexual Activity   Alcohol use: Never    Comment: twice a year mixed drink   Drug use: Never   Sexual activity: Not Currently    Birth control/protection: Surgical  Other Topics Concern   Not on file  Social History Narrative   Social history: She is divorced.  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  Does not smoke or consume alcohol.  She is now caring for her elderly parents in their home which is stressful.  However she says it is less stressful now that she is living with them.       Family history: Brother with history of  hypertrophic cardiomyopathy.  1 sister in good health.  Mother with history of stroke, heart disease, kidney stones as well as diabetes.  Father with history of diabetes.   Social Drivers of Health   Tobacco Use: Low Risk (02/01/2024)   Patient History    Smoking Tobacco Use: Never    Smokeless Tobacco Use: Never    Passive Exposure: Not on file  Financial Resource Strain: Low Risk (08/06/2023)   Overall Financial Resource Strain (CARDIA)    Difficulty of Paying Living Expenses: Not hard at all  Food Insecurity: No Food Insecurity (08/06/2023)   Epic    Worried About Programme Researcher, Broadcasting/film/video in the Last Year: Never true    Ran Out of Food in the Last Year: Never true  Transportation Needs: No Transportation Needs (08/06/2023)   Epic    Lack of Transportation (Medical): No    Lack of Transportation (Non-Medical): No  Physical Activity: Inactive (08/06/2023)   Exercise  Vital Sign    Days of Exercise per Week: 0 days    Minutes of Exercise per Session: Not on file  Stress: Stress Concern Present (08/06/2023)   [employer] of Occupational Health - Occupational Stress Questionnaire    Feeling of Stress: Rather much  Social Connections: Socially Isolated (08/06/2023)   Social Connection and Isolation Panel    Frequency of Communication with Friends and Family: More than three times a week    Frequency of Social Gatherings with Friends and Family: Once a week    Attends Religious Services: Never    Database Administrator or Organizations: No    Attends Engineer, Structural: Not on file    Marital Status: Divorced  Intimate Partner Violence: Not on file  Depression (PHQ2-9): High Risk (02/01/2024)   Depression (PHQ2-9)    PHQ-2 Score: 12  Alcohol Screen: Not on file  Housing: Low Risk (08/06/2023)   Epic    Unable to Pay for Housing in the Last Year: No    Number of Times Moved in the Last Year: 1    Homeless in the Last Year: No  Utilities: Not on file  Health Literacy: Not on file    Past Surgical History:  Procedure Laterality Date   ABDOMINAL HYSTERECTOMY     COLONOSCOPY     POLYPECTOMY     SHOULDER ARTHROSCOPY  06/08/2011   Procedure: ARTHROSCOPY SHOULDER;  Surgeon: Franky CHRISTELLA Pointer, MD;  Location: [facility] OR;  Service: Orthopedics;  Laterality: Left;  MUA, LOA, SAD, DCR    Family History  Problem Relation Age of Onset   Heart disease Mother 36       CAD   Asthma Mother    Dementia Mother    Stroke Mother    Breast cancer Mother    Diabetes Mother    Heart Problems Father    Diabetes Father    Kidney failure Father    Asthma Sister    Asthma Brother    Breast cancer Maternal Grandmother    Colon cancer Neg Hx    Esophageal cancer Neg Hx    Rectal cancer Neg Hx    Stomach cancer Neg Hx    Colon polyps Neg Hx     Allergies[1]  Medications Ordered Prior to Encounter[2]  BP 128/78   Pulse (!) 107   Temp 97.9 F (36.6  C) (Oral)   Ht 5' 8 (1.727 m)   Wt 225 lb 2 oz (102.1 kg)   SpO2 96%   BMI  34.23 kg/m  Objective:   Physical Exam Cardiovascular:     Rate and Rhythm: Normal rate and regular rhythm.  Pulmonary:     Effort: Pulmonary effort is normal.     Breath sounds: Normal breath sounds.  Musculoskeletal:     Cervical back: Neck supple.  Skin:    General: Skin is warm and dry.  Neurological:     Mental Status: She is alert and oriented to person, place, and time.  Psychiatric:        Mood and Affect: Mood normal.     Physical Exam        Assessment & Plan:  Type 2 diabetes mellitus with hyperglycemia, without long-term current use of insulin (HCC) Assessment & Plan: Slightly deteriorated with A1C of 8.2 today.  We had a discussion regarding treatment options.  She would like to work on changing her diet rather than increasing insulin. This is reasonable.  Deferred foot exam today due to compression hose per patient request.  Recent podiatry evaluation.  Work on diet.  Continue basalgar 22 units daily, Novlog 8-16 units TID with meals. We will check on the Jardiance denial from recent PA.  Follow up in 3 months.  Orders: -     POCT glycosylated hemoglobin (Hb A1C) -     NovoLOG  FlexPen; Inject 8-16 Units into the skin 3 (three) times daily with meals.  Dispense: 15 mL; Refill: 0  Mild intermittent asthma without complication -     Montelukast  Sodium; Take 1 tablet (10 mg total) by mouth every morning. For allergies  Dispense: 90 tablet; Refill: 2 -     Albuterol  Sulfate HFA; INHALE TWO PUFFS BY MOUTH EVERY 4 TO 6 HOURS AS NEEDED FOR COUGH OR WHEEZING  Dispense: 18 g; Refill: 0    Assessment and Plan Assessment & Plan         Comer MARLA Gaskins, NP       [1]  Allergies Allergen Reactions   Clarithromycin Hives   Vicodin [Hydrocodone -Acetaminophen ] Nausea And Vomiting   Byetta 10 Mcg Pen [Exenatide]    Exenatide Other (See Comments)    Rash & itching at  injection site  [2]  Current Outpatient Medications on File Prior to Visit  Medication Sig Dispense Refill   aspirin EC 81 MG tablet Take 81 mg by mouth daily. Swallow whole.     atorvastatin  (LIPITOR) 20 MG tablet Take 1 tablet (20 mg total) by mouth daily. for cholesterol. 90 tablet 2   buPROPion  (WELLBUTRIN  XL) 300 MG 24 hr tablet Take 1 tablet (300 mg total) by mouth daily. For depression 90 tablet 0   Calcium -Vitamin D  600-200 MG-UNIT per tablet Take 1 tablet by mouth daily.     cetirizine (ZYRTEC) 10 MG tablet Take 10 mg by mouth daily.     Cholecalciferol (VITAMIN D3) 125 MCG (5000 UT) CAPS Take 1 capsule by mouth daily in the afternoon.     Continuous Glucose Sensor (FREESTYLE LIBRE 2 PLUS SENSOR) MISC Inject 1 each into the skin as directed. Change sensor every 15 days 6 each 0   empagliflozin (JARDIANCE) 10 MG TABS tablet Take by mouth daily.     ferrous fumarate (HEMOCYTE - 106 MG FE) 325 (106 FE) MG TABS Take 1 tablet by mouth daily.     Insulin Glargine  (BASAGLAR  KWIKPEN) 100 UNIT/ML INJECT 26 UNITS INTO THE SKIN AT BEDTIME 15 mL 2   Insulin Pen Needle (DROPLET PEN NEEDLES) 31G X 8 MM MISC USE AS DIRECTED  100 each 5   ketoconazole (NIZORAL) 2 % cream APPLY TO THE AFFECTED AREA(S) BY TOPICAL ROUTE ONCE DAILY.     multivitamin (THERAGRAN) per tablet Take 1 tablet by mouth daily.     pantoprazole  (PROTONIX ) 20 MG tablet TAKE 1 TABLET BY MOUTH DAILY FOR HEARTBURN 90 tablet 2   rizatriptan  (MAXALT ) 5 MG tablet Take 1 tablet by mouth at migraine onset. May repeat in 2 hours if needed 10 tablet 0   venlafaxine  XR (EFFEXOR -XR) 150 MG 24 hr capsule TAKE 1 CAPSULE BY MOUTH DAILY WITH BREAKFAST FOR ANXIETY AND DEPRESSION 90 capsule 2   venlafaxine  XR (EFFEXOR -XR) 75 MG 24 hr capsule TAKE 1 CAPSULE BY MOUTH DAILY FOR ANXIETY AND DEPRESSION 90 capsule 2   No current facility-administered medications on file prior to visit.

## 2024-02-01 NOTE — Patient Instructions (Addendum)
 Continue Basaglar  long acting insulin at 22 units daily.  Continue Novolog  short acting insulin at 8-16 units three times daily with meals.   We will touch base regarding the Jardiance medication.  Please schedule a follow up visit for 3 months.  It was a pleasure to see you today!

## 2024-02-01 NOTE — Assessment & Plan Note (Addendum)
 Slightly deteriorated with A1C of 8.2 today.  We had a discussion regarding treatment options.  She would like to work on changing her diet rather than increasing insulin. This is reasonable.  Deferred foot exam today due to compression hose per patient request.  Recent podiatry evaluation.  Work on diet.  Continue basalgar 22 units daily, Novlog 8-16 units TID with meals. We will check on the Jardiance denial from recent PA.  Follow up in 3 months.

## 2024-02-04 ENCOUNTER — Other Ambulatory Visit (HOSPITAL_COMMUNITY): Payer: Self-pay

## 2024-02-05 ENCOUNTER — Other Ambulatory Visit: Payer: Self-pay | Admitting: Internal Medicine

## 2024-02-06 ENCOUNTER — Ambulatory Visit: Admitting: Psychology

## 2024-02-12 ENCOUNTER — Telehealth: Payer: Self-pay

## 2024-02-12 NOTE — Telephone Encounter (Signed)
 Noted

## 2024-02-12 NOTE — Telephone Encounter (Signed)
 Copied from CRM #[id number]. Topic: Clinical - Medication Prior Auth >> Feb 12, 2024 12:07 PM Viola F wrote: Patient wants to let Comer Gaskins know that her kidney doctor is going to help with the prior authorization for the Jardiance so she doesn't have to worry about it.

## 2024-02-15 ENCOUNTER — Other Ambulatory Visit: Payer: Self-pay | Admitting: Primary Care

## 2024-02-15 DIAGNOSIS — F3170 Bipolar disorder, currently in remission, most recent episode unspecified: Secondary | ICD-10-CM

## 2024-02-15 DIAGNOSIS — J4520 Mild intermittent asthma, uncomplicated: Secondary | ICD-10-CM

## 2024-02-26 ENCOUNTER — Other Ambulatory Visit (HOSPITAL_COMMUNITY): Payer: Self-pay

## 2024-02-26 ENCOUNTER — Telehealth: Payer: Self-pay | Admitting: Primary Care

## 2024-02-26 NOTE — Telephone Encounter (Signed)
-----   Message from Vladimir BIRCH Clyburn sent at 02/26/2024  3:46 PM EST ----- Regarding: RE: Jardiance PA Denial letter in patient media dated 01/21/24 says patient must try/fail metformin. Unable to test claim Doreen, no updated patient insurance. I tried to cut/ paste the denial letter but it wouldn't let me. ----- Message ----- From: Gretta Comer POUR, NP Sent: 02/01/2024   1:15 PM EST To: Vladimir BIRCH Meier, [facility] Subject: Jardiance PA                                   Hey!  This patient has been on Jardiance for renal function and diabetes for about one year.  More recently the Jardiance required a prior authorization.  The patient states that the PA was denied by her insurance.  Can we find out why?  Do they prefer Farxiga?  Thanks! Mallie Gretta

## 2024-03-05 ENCOUNTER — Ambulatory Visit: Admitting: Psychology

## 2024-05-01 ENCOUNTER — Ambulatory Visit: Admitting: Primary Care
